# Patient Record
Sex: Male | Born: 1955 | Race: Black or African American | Hispanic: No | Marital: Married | Smoking: Former smoker
Health system: Southern US, Community
[De-identification: ages and names within clinical notes are randomized; demographics above are authoritative.]

## PROBLEM LIST (undated history)

## (undated) DIAGNOSIS — E785 Hyperlipidemia, unspecified: Secondary | ICD-10-CM

## (undated) DIAGNOSIS — I10 Essential (primary) hypertension: Secondary | ICD-10-CM

## (undated) DIAGNOSIS — J449 Chronic obstructive pulmonary disease, unspecified: Secondary | ICD-10-CM

## (undated) HISTORY — PX: TOTAL HIP ARTHROPLASTY: SHX124

## (undated) HISTORY — DX: Chronic obstructive pulmonary disease, unspecified: J44.9

## (undated) HISTORY — DX: Essential (primary) hypertension: I10

## (undated) HISTORY — DX: Hyperlipidemia, unspecified: E78.5

---

## 2000-09-24 ENCOUNTER — Encounter: Payer: Self-pay | Admitting: Podiatry

## 2000-09-24 ENCOUNTER — Ambulatory Visit (HOSPITAL_COMMUNITY): Admission: RE | Admit: 2000-09-24 | Discharge: 2000-09-24 | Payer: Self-pay | Admitting: Family Medicine

## 2000-09-24 ENCOUNTER — Encounter: Payer: Self-pay | Admitting: Family Medicine

## 2000-09-28 ENCOUNTER — Ambulatory Visit (HOSPITAL_COMMUNITY): Admission: RE | Admit: 2000-09-28 | Discharge: 2000-09-28 | Payer: Self-pay | Admitting: Podiatry

## 2001-09-02 ENCOUNTER — Encounter: Payer: Self-pay | Admitting: Family Medicine

## 2001-09-02 ENCOUNTER — Ambulatory Visit (HOSPITAL_COMMUNITY): Admission: RE | Admit: 2001-09-02 | Discharge: 2001-09-02 | Payer: Self-pay | Admitting: Family Medicine

## 2001-09-26 ENCOUNTER — Emergency Department (HOSPITAL_COMMUNITY): Admission: EM | Admit: 2001-09-26 | Discharge: 2001-09-26 | Payer: Self-pay | Admitting: *Deleted

## 2001-09-26 ENCOUNTER — Encounter: Payer: Self-pay | Admitting: *Deleted

## 2001-10-28 ENCOUNTER — Encounter: Admission: RE | Admit: 2001-10-28 | Discharge: 2001-10-28 | Payer: Self-pay | Admitting: Neurosurgery

## 2001-10-28 ENCOUNTER — Encounter: Payer: Self-pay | Admitting: Neurosurgery

## 2001-11-22 ENCOUNTER — Encounter: Payer: Self-pay | Admitting: Family Medicine

## 2001-11-22 ENCOUNTER — Ambulatory Visit (HOSPITAL_COMMUNITY): Admission: RE | Admit: 2001-11-22 | Discharge: 2001-11-22 | Payer: Self-pay | Admitting: Family Medicine

## 2001-12-28 ENCOUNTER — Encounter: Payer: Self-pay | Admitting: Orthopedic Surgery

## 2002-01-03 ENCOUNTER — Inpatient Hospital Stay (HOSPITAL_COMMUNITY): Admission: RE | Admit: 2002-01-03 | Discharge: 2002-01-06 | Payer: Self-pay | Admitting: Orthopedic Surgery

## 2002-01-03 ENCOUNTER — Encounter: Payer: Self-pay | Admitting: Orthopedic Surgery

## 2004-03-14 ENCOUNTER — Ambulatory Visit: Payer: Self-pay | Admitting: Family Medicine

## 2004-04-30 ENCOUNTER — Ambulatory Visit (HOSPITAL_COMMUNITY): Admission: RE | Admit: 2004-04-30 | Discharge: 2004-04-30 | Payer: Self-pay | Admitting: Family Medicine

## 2004-08-02 ENCOUNTER — Ambulatory Visit: Payer: Self-pay | Admitting: Family Medicine

## 2004-12-11 ENCOUNTER — Ambulatory Visit: Payer: Self-pay | Admitting: Family Medicine

## 2004-12-11 ENCOUNTER — Ambulatory Visit (HOSPITAL_COMMUNITY): Admission: RE | Admit: 2004-12-11 | Discharge: 2004-12-11 | Payer: Self-pay | Admitting: Family Medicine

## 2004-12-19 ENCOUNTER — Ambulatory Visit (HOSPITAL_COMMUNITY): Admission: RE | Admit: 2004-12-19 | Discharge: 2004-12-19 | Payer: Self-pay | Admitting: Family Medicine

## 2005-02-04 ENCOUNTER — Ambulatory Visit: Payer: Self-pay | Admitting: Family Medicine

## 2005-04-08 ENCOUNTER — Ambulatory Visit: Payer: Self-pay | Admitting: Family Medicine

## 2005-06-11 ENCOUNTER — Ambulatory Visit: Payer: Self-pay | Admitting: Family Medicine

## 2005-06-12 ENCOUNTER — Encounter (INDEPENDENT_AMBULATORY_CARE_PROVIDER_SITE_OTHER): Payer: Self-pay | Admitting: *Deleted

## 2005-06-12 LAB — CONVERTED CEMR LAB: PSA: 1.03 ng/mL

## 2005-09-01 ENCOUNTER — Ambulatory Visit: Payer: Self-pay | Admitting: Internal Medicine

## 2005-10-16 ENCOUNTER — Ambulatory Visit: Payer: Self-pay | Admitting: Family Medicine

## 2005-11-03 ENCOUNTER — Ambulatory Visit: Payer: Self-pay | Admitting: Gastroenterology

## 2005-11-03 ENCOUNTER — Encounter (INDEPENDENT_AMBULATORY_CARE_PROVIDER_SITE_OTHER): Payer: Self-pay | Admitting: *Deleted

## 2005-11-03 ENCOUNTER — Ambulatory Visit (HOSPITAL_COMMUNITY): Admission: RE | Admit: 2005-11-03 | Discharge: 2005-11-03 | Payer: Self-pay | Admitting: Gastroenterology

## 2005-11-20 ENCOUNTER — Ambulatory Visit: Payer: Self-pay | Admitting: Family Medicine

## 2005-11-21 ENCOUNTER — Ambulatory Visit (HOSPITAL_COMMUNITY): Admission: RE | Admit: 2005-11-21 | Discharge: 2005-11-21 | Payer: Self-pay | Admitting: Family Medicine

## 2005-11-21 ENCOUNTER — Encounter (INDEPENDENT_AMBULATORY_CARE_PROVIDER_SITE_OTHER): Payer: Self-pay | Admitting: *Deleted

## 2005-11-21 LAB — CONVERTED CEMR LAB: Blood Glucose, Fasting: 107 mg/dL

## 2006-02-03 ENCOUNTER — Ambulatory Visit: Payer: Self-pay | Admitting: Family Medicine

## 2006-05-07 ENCOUNTER — Ambulatory Visit: Payer: Self-pay | Admitting: Family Medicine

## 2006-05-25 ENCOUNTER — Ambulatory Visit: Payer: Self-pay | Admitting: Family Medicine

## 2006-08-18 ENCOUNTER — Ambulatory Visit: Payer: Self-pay | Admitting: Family Medicine

## 2006-09-24 ENCOUNTER — Ambulatory Visit: Payer: Self-pay | Admitting: Family Medicine

## 2006-12-25 ENCOUNTER — Ambulatory Visit: Payer: Self-pay | Admitting: Family Medicine

## 2007-03-16 ENCOUNTER — Ambulatory Visit: Payer: Self-pay | Admitting: Family Medicine

## 2007-03-31 ENCOUNTER — Encounter: Payer: Self-pay | Admitting: *Deleted

## 2007-03-31 DIAGNOSIS — M109 Gout, unspecified: Secondary | ICD-10-CM | POA: Insufficient documentation

## 2007-03-31 DIAGNOSIS — J45901 Unspecified asthma with (acute) exacerbation: Secondary | ICD-10-CM

## 2007-03-31 DIAGNOSIS — J441 Chronic obstructive pulmonary disease with (acute) exacerbation: Secondary | ICD-10-CM | POA: Insufficient documentation

## 2007-03-31 DIAGNOSIS — I10 Essential (primary) hypertension: Secondary | ICD-10-CM | POA: Insufficient documentation

## 2007-03-31 DIAGNOSIS — J439 Emphysema, unspecified: Secondary | ICD-10-CM | POA: Insufficient documentation

## 2007-04-16 ENCOUNTER — Ambulatory Visit: Payer: Self-pay | Admitting: Family Medicine

## 2007-05-20 ENCOUNTER — Ambulatory Visit: Payer: Self-pay | Admitting: Family Medicine

## 2007-09-24 ENCOUNTER — Emergency Department (HOSPITAL_COMMUNITY): Admission: EM | Admit: 2007-09-24 | Discharge: 2007-09-24 | Payer: Self-pay | Admitting: Emergency Medicine

## 2007-11-04 ENCOUNTER — Ambulatory Visit: Payer: Self-pay | Admitting: Family Medicine

## 2007-11-04 ENCOUNTER — Telehealth: Payer: Self-pay | Admitting: Family Medicine

## 2007-12-14 ENCOUNTER — Telehealth: Payer: Self-pay | Admitting: Family Medicine

## 2008-01-12 ENCOUNTER — Telehealth: Payer: Self-pay | Admitting: Family Medicine

## 2008-01-19 ENCOUNTER — Ambulatory Visit: Payer: Self-pay | Admitting: Family Medicine

## 2008-01-19 DIAGNOSIS — N63 Unspecified lump in unspecified breast: Secondary | ICD-10-CM | POA: Insufficient documentation

## 2008-01-21 ENCOUNTER — Encounter: Payer: Self-pay | Admitting: Family Medicine

## 2008-01-23 DIAGNOSIS — J209 Acute bronchitis, unspecified: Secondary | ICD-10-CM | POA: Insufficient documentation

## 2008-01-23 DIAGNOSIS — F17200 Nicotine dependence, unspecified, uncomplicated: Secondary | ICD-10-CM | POA: Insufficient documentation

## 2008-01-24 ENCOUNTER — Ambulatory Visit (HOSPITAL_COMMUNITY): Admission: RE | Admit: 2008-01-24 | Discharge: 2008-01-24 | Payer: Self-pay | Admitting: Family Medicine

## 2008-01-24 ENCOUNTER — Encounter: Payer: Self-pay | Admitting: Family Medicine

## 2008-01-31 ENCOUNTER — Telehealth: Payer: Self-pay | Admitting: Family Medicine

## 2008-08-16 ENCOUNTER — Emergency Department: Payer: Self-pay | Admitting: Internal Medicine

## 2008-08-16 ENCOUNTER — Encounter: Payer: Self-pay | Admitting: Family Medicine

## 2008-08-16 ENCOUNTER — Telehealth: Payer: Self-pay | Admitting: Family Medicine

## 2008-08-17 ENCOUNTER — Emergency Department (HOSPITAL_COMMUNITY): Admission: EM | Admit: 2008-08-17 | Discharge: 2008-08-17 | Payer: Self-pay | Admitting: Emergency Medicine

## 2008-08-17 ENCOUNTER — Emergency Department: Payer: Self-pay | Admitting: Emergency Medicine

## 2008-08-23 ENCOUNTER — Ambulatory Visit: Payer: Self-pay | Admitting: Family Medicine

## 2008-08-28 ENCOUNTER — Emergency Department (HOSPITAL_COMMUNITY): Admission: EM | Admit: 2008-08-28 | Discharge: 2008-08-28 | Payer: Self-pay | Admitting: Emergency Medicine

## 2008-09-08 ENCOUNTER — Telehealth: Payer: Self-pay | Admitting: Family Medicine

## 2008-09-10 ENCOUNTER — Emergency Department (HOSPITAL_COMMUNITY): Admission: EM | Admit: 2008-09-10 | Discharge: 2008-09-10 | Payer: Self-pay | Admitting: Emergency Medicine

## 2008-09-12 ENCOUNTER — Encounter: Payer: Self-pay | Admitting: Orthopedic Surgery

## 2008-09-19 ENCOUNTER — Ambulatory Visit: Payer: Self-pay | Admitting: Orthopedic Surgery

## 2008-09-19 DIAGNOSIS — D492 Neoplasm of unspecified behavior of bone, soft tissue, and skin: Secondary | ICD-10-CM | POA: Insufficient documentation

## 2008-09-20 ENCOUNTER — Encounter: Payer: Self-pay | Admitting: Family Medicine

## 2008-09-20 ENCOUNTER — Ambulatory Visit (HOSPITAL_COMMUNITY): Admission: RE | Admit: 2008-09-20 | Discharge: 2008-09-20 | Payer: Self-pay | Admitting: Orthopedic Surgery

## 2008-09-20 ENCOUNTER — Telehealth: Payer: Self-pay | Admitting: Orthopedic Surgery

## 2008-09-27 ENCOUNTER — Ambulatory Visit: Payer: Self-pay | Admitting: Orthopedic Surgery

## 2008-09-27 ENCOUNTER — Telehealth: Payer: Self-pay | Admitting: Family Medicine

## 2008-09-29 ENCOUNTER — Telehealth: Payer: Self-pay | Admitting: Family Medicine

## 2008-09-29 ENCOUNTER — Ambulatory Visit: Payer: Self-pay | Admitting: Family Medicine

## 2008-10-25 ENCOUNTER — Ambulatory Visit: Payer: Self-pay | Admitting: Family Medicine

## 2008-10-25 ENCOUNTER — Telehealth: Payer: Self-pay | Admitting: Family Medicine

## 2008-10-25 DIAGNOSIS — R079 Chest pain, unspecified: Secondary | ICD-10-CM | POA: Insufficient documentation

## 2008-11-14 ENCOUNTER — Telehealth: Payer: Self-pay | Admitting: Family Medicine

## 2008-12-21 ENCOUNTER — Telehealth: Payer: Self-pay | Admitting: Family Medicine

## 2008-12-28 ENCOUNTER — Telehealth: Payer: Self-pay | Admitting: Family Medicine

## 2009-02-13 ENCOUNTER — Telehealth: Payer: Self-pay | Admitting: Family Medicine

## 2009-03-13 ENCOUNTER — Telehealth: Payer: Self-pay | Admitting: Family Medicine

## 2009-04-30 ENCOUNTER — Telehealth: Payer: Self-pay | Admitting: Family Medicine

## 2009-05-14 ENCOUNTER — Telehealth: Payer: Self-pay | Admitting: Physician Assistant

## 2009-06-21 ENCOUNTER — Telehealth: Payer: Self-pay | Admitting: Family Medicine

## 2009-06-22 ENCOUNTER — Ambulatory Visit: Payer: Self-pay | Admitting: Family Medicine

## 2009-07-12 ENCOUNTER — Emergency Department (HOSPITAL_COMMUNITY): Admission: EM | Admit: 2009-07-12 | Discharge: 2009-07-13 | Payer: Self-pay | Admitting: Emergency Medicine

## 2009-11-30 ENCOUNTER — Telehealth: Payer: Self-pay | Admitting: Family Medicine

## 2010-04-16 NOTE — Progress Notes (Signed)
Summary: rx  Phone Note Call from Patient   Summary of Call: would like to get something for gout. [facility] [phone] Initial call taken by: Rudene Anda,  April 30, 2009 9:13 AM  Follow-up for Phone Call        pls refill themeds, indomethacin and prednisone he generally gets, but needs an oV in the next approx 2 months, f/u, has not been in for a hile Follow-up by: Syliva Overman MD,  April 30, 2009 9:48 AM  Additional Follow-up for Phone Call Additional follow up Details #1::        rx sent, left detailed message for patient Additional Follow-up by: Lilyan Gilford LPN,  April 30, 2009 2:17 PM    Prescriptions: INDOMETHACIN 25 MG CAPS (INDOMETHACIN) one tablet three times daily x 2 days, then one tablet twice daily x 2 days then one daily x 3 days, then stop  #13 x 0   Entered by:   Lilyan Gilford LPN   Authorized by:   Syliva Overman MD   Signed by:   Lilyan Gilford LPN on 45/40/9811   Method used:   Electronically to        [facility]  [address] (retail)       [address]       [place], [place]  [address]       Ph: [phone]       Fax: [phone]   RxID:   [id number] PREDNISONE (PAK) 5 MG TABS (PREDNISONE) Use as directed  #21 x 0   Entered by:   Lilyan Gilford LPN   Authorized by:   Syliva Overman MD   Signed by:   Lilyan Gilford LPN on 66/44/0347   Method used:   Electronically to        [facility]  [address] (retail)       [address]       [place], [place]  [address]       Ph: [phone]       Fax: [phone]   RxID:   [id number]

## 2010-04-16 NOTE — Assessment & Plan Note (Signed)
Summary: INJECTION  Nurse Visit              Comments patient complains of gout pain     Prior Medications: MAXZIDE-25 37.5-25 MG  TABS (TRIAMTERENE-HCTZ) one tab by mouth once daily Current Allergies: No known allergies     Medication Administration  Injection # 1:    Medication: Depo- Medrol 80mg     Diagnosis: GOUT (ICD-274.9)    Route: IM    Site: RUOQ gluteus    Exp Date: 9/10    Lot #: [id number]    Mfr: sicor    Patient tolerated injection without complications    Given by: Worthy Keeler LPN (November 04, 2007 2:58 PM)  Injection # 2:    Medication: Ketorolac-Toradol 15mg     Diagnosis: GOUT (ICD-274.9)    Route: IM    Site: LUOQ gluteus    Exp Date: 05/15/2009    Lot #: [id number]    Mfr: novaplus    Comments: toradol 60mg  given    Patient tolerated injection without complications    Given by: Worthy Keeler LPN (November 04, 2007 2:59 PM)  Orders Added: 1)  Depo- Medrol 80mg  [J1040] 2)  Ketorolac-Toradol 15mg  [J1885] 3)  Admin of Therapeutic Inj  intramuscular or subcutaneous Lepidus.Putnam    ]

## 2010-04-16 NOTE — Progress Notes (Signed)
Summary: samples  Phone Note Call from Patient   Summary of Call: [facility] benicar 20mg  [phone] Initial call taken by: Rudene Anda,  November 14, 2008 4:45 PM  Follow-up for Phone Call        Not on med list.  Returned call, no answer Follow-up by: Everitt Amber,  November 15, 2008 9:13 AM  Additional Follow-up for Phone Call Additional follow up Details #1::        returned call, left msg Additional Follow-up by: Everitt Amber,  November 15, 2008 1:14 PM    Additional Follow-up for Phone Call Additional follow up Details #2::    There is Benazepril 20mg  on his med list (maybe she confused it with Benicar) Called pharmacy and she said that the benazepril was ready to be picked up. Will tell Lanora Manis when she calls Follow-up by: Everitt Amber,  November 15, 2008 2:00 PM

## 2010-04-16 NOTE — Progress Notes (Signed)
Summary: referral  Phone Note Call from Patient   Summary of Call: Made referral for [facility] on 10/27/2008. He had to pay 100.00 dollars up front due to no insurance. I called pt with appt and he said he doesn't have that kind of money and for me to cancel this appt. Initial call taken by: Rudene Anda,  October 25, 2008 3:29 PM  Follow-up for Phone Call        noted, thank you Follow-up by: Syliva Overman MD,  October 25, 2008 9:34 PM     Appended Document: referral i recommend you find out if Adolph Pollack will see him and at what cost, iflower see if pt wants that apppt ps   Appended Document: referral pts wife is supposed to call back and let me know if he will go see someone at [facility].   Appended Document: referral pls [facility] [place] a note stating that he did not show after we made thearrangement , see note in your box, in a case where the pt states clearly that theyare not going, pls just notify the clinic to whom we referred also in addition to documenting pt's response and reason in thiscase it was financial.  tHIS iS A COURTESY we need to afford to the offices who we refer to so they can adjust their sched accordingly

## 2010-04-16 NOTE — Progress Notes (Signed)
Summary: MRI appointment.  Phone Note Outgoing Call   Call placed by: [name],  September 20, 2008 2:40 PM Call placed to: Specialist Action Taken: Phone Call Completed, Appt scheduled Summary of Call: I scheduled this patient an MRI at [facility] on 09-20-08 at 12:30. Patient does not have insurance. Patient has a follow up appointment back here to go over results.

## 2010-04-16 NOTE — Progress Notes (Signed)
Summary: GOUT  Phone Note Call from Patient   Summary of Call: HAS THE GOUT WANTS A SHOT  PLEASE CALL  [phone]  CAN NOT HARDLY WALK Initial call taken by: Lind Guest,  November 04, 2007 10:14 AM  Follow-up for Phone Call        Pt. can come  in for Toradol 60mg  and Depomedrol 80mg  Im today n  Follow-up by: Syliva Overman MD,  November 04, 2007 12:23 PM  Additional Follow-up for Phone Call Additional follow up Details #1::        COMING IN TODAY Additional Follow-up by: Lind Guest,  November 04, 2007 1:48 PM

## 2010-04-16 NOTE — Progress Notes (Signed)
Summary: CALL BACK  Phone Note Call from Patient   Summary of Call: WHEN U SEND IN THE MEDICINE CALL HIM [phone] Initial call taken by: Lind Guest,  March 13, 2009 11:24 AM  Follow-up for Phone Call        advised med sent in this morning Follow-up by: Worthy Keeler LPN,  March 13, 2009 2:52 PM

## 2010-04-16 NOTE — Progress Notes (Signed)
Summary: GOUT  Phone Note Call from Patient   Summary of Call: Todd Randall SAID HE HAS GOUT REAL BAD WANTS TO KNOW WILL U CALL IN SOMETHING OR A SHOT PLEASE CALL HER BACK AND LET HER KNOW AT [phone] Initial call taken by: Lind Guest,  March 13, 2009 8:21 AM  Follow-up for Phone Call        refill the indomethacin and prednisone dose pack last given pls x1  Follow-up by: Syliva Overman MD,  March 13, 2009 9:55 AM  Additional Follow-up for Phone Call Additional follow up Details #1::        Rx Randall In and patient aware Additional Follow-up by: Worthy Keeler LPN,  March 13, 2009 11:18 AM    Prescriptions: INDOMETHACIN 25 MG CAPS (INDOMETHACIN) one tablet three times daily x 2 days, then one tablet twice daily x 2 days then one daily x 3 days, then stop  #13 x 0   Entered by:   Worthy Keeler LPN   Authorized by:   Syliva Overman MD   Signed by:   Worthy Keeler LPN on 41/32/4401   Method used:   Electronically to        [facility]  [address] (retail)       [address]       [place], [place]  [address]       Ph: [phone]       Fax: [phone]   RxID:   [id number] PREDNISONE (PAK) 5 MG TABS (PREDNISONE) Use as directed  #21 x 0   Entered by:   Worthy Keeler LPN   Authorized by:   Syliva Overman MD   Signed by:   Worthy Keeler LPN on 01/60/1093   Method used:   Electronically to        [facility]  [address] (retail)       [address]       [place], [place]  [address]       Ph: [phone]       Fax: [phone]   RxID:   [id number]

## 2010-04-16 NOTE — Letter (Signed)
Summary: CT order  CT order   Imported By: Cammie Sickle 09/20/2008 09:37:59  _____________________________________________________________________  External Attachment:    Type:   Image     Comment:   External Document

## 2010-04-16 NOTE — Medication Information (Signed)
Summary: Tax adviser   Imported By: Lind Guest 01/21/2008 15:21:46  _____________________________________________________________________  External Attachment:    Type:   Image     Comment:   External Document

## 2010-04-16 NOTE — Progress Notes (Signed)
Summary: RESULTS  Phone Note Call from Patient   Summary of Call: WANTS RESULTS [phone] Initial call taken by: Rudene Anda,  January 31, 2008 2:07 PM  Follow-up for Phone Call        Phone Call Completed Follow-up by: Worthy Keeler LPN,  February 01, 2008 11:31 AM

## 2010-04-16 NOTE — Letter (Signed)
Summary: Historic Chart  Historic Chart   Imported By: Micah Flesher, LPN 82/95/6213 08:65:78  _____________________________________________________________________  External Attachment:    Type:   Image     Comment:   External Document

## 2010-04-16 NOTE — Miscellaneous (Signed)
Summary: SAMPLES  Clinical Lists Changes  Medications: Changed medication from SPIRIVA HANDIHALER 18 MCG CAPS (TIOTROPIUM BROMIDE MONOHYDRATE) two puffs qd to SPIRIVA HANDIHALER 18 MCG CAPS (TIOTROPIUM BROMIDE MONOHYDRATE) two puffs qd - Signed Rx of SPIRIVA HANDIHALER 18 MCG CAPS (TIOTROPIUM BROMIDE MONOHYDRATE) two puffs qd;  #4 x 0;  Signed;  Entered by: Worthy Keeler LPN;  Authorized by: Syliva Overman MD;  Method used: Samples Given    Prescriptions: SPIRIVA HANDIHALER 18 MCG CAPS (TIOTROPIUM BROMIDE MONOHYDRATE) two puffs qd  #4 x 0   Entered by:   Worthy Keeler LPN   Authorized by:   Syliva Overman MD   Signed by:   Worthy Keeler LPN on 04/54/0981   Method used:   Samples Given   RxID:   [id number]

## 2010-04-16 NOTE — Letter (Signed)
Summary: History form  History form   Imported By: Jacklynn Ganong 09/21/2008 16:28:39  _____________________________________________________________________  External Attachment:    Type:   Image     Comment:   External Document

## 2010-04-16 NOTE — Assessment & Plan Note (Signed)
Summary: GOUT INJ  Nurse Visit   Vital Signs:  Patient profile:   55 year old male Weight:      190.19 pounds BMI:     29.90 Pulse rate:   101 / minute BP sitting:   150 / 80  (left arm)  Vitals Entered By: Worthy Keeler LPN (September 29, 2008 10:00 AM) Comments patient in today for injections for gout pain   Allergies: No Known Drug Allergies  Medication Administration  Injection # 1:    Medication: Depo- Medrol 80mg     Diagnosis: GOUT (ICD-274.9)    Route: IM    Site: RUOQ gluteus    Exp Date: 7/12    Lot #: [id number]    Mfr: Pharmacia    Patient tolerated injection without complications    Given by: Worthy Keeler LPN (September 29, 2008 10:01 AM)  Injection # 2:    Medication: Ketorolac-Toradol 15mg     Diagnosis: GOUT (ICD-274.9)    Route: IM    Site: LUOQ gluteus    Exp Date: 04/17/2010    Lot #: [id number]    Mfr: novaplus    Comments: toradol 60mg  given    Patient tolerated injection without complications    Given by: Worthy Keeler LPN (September 29, 2008 10:02 AM)  Orders Added: 1)  Depo- Medrol 80mg  [J1040] 2)  Ketorolac-Toradol 15mg  [J1885] 3)  Admin of Therapeutic Inj  intramuscular or subcutaneous [[id number]] Prescriptions: PREDNISONE (PAK) 5 MG TABS (PREDNISONE) uad  #21 x 0   Entered by:   Worthy Keeler LPN   Authorized by:   Syliva Overman MD   Signed by:   Worthy Keeler LPN on 11/91/4782   Method used:   Electronically to        [facility]  [address] (retail)       [address]       [place], [place]  [address]       Ph: [phone]       Fax: [phone]   RxID:   [id number]    Medication Administration  Injection # 1:    Medication: Depo- Medrol 80mg     Diagnosis: GOUT (ICD-274.9)    Route: IM    Site: RUOQ gluteus    Exp Date: 7/12    Lot #: [id number]    Mfr: Pharmacia    Patient tolerated injection without complications    Given by: Worthy Keeler LPN (September 29, 2008 10:01 AM)  Injection # 2:    Medication: Ketorolac-Toradol 15mg    Diagnosis: GOUT (ICD-274.9)    Route: IM    Site: LUOQ gluteus    Exp Date: 04/17/2010    Lot #: [id number]    Mfr: novaplus    Comments: toradol 60mg  given    Patient tolerated injection without complications    Given by: Worthy Keeler LPN (September 29, 2008 10:02 AM)  Orders Added: 1)  Depo- Medrol 80mg  [J1040] 2)  Ketorolac-Toradol 15mg  [J1885] 3)  Admin of Therapeutic Inj  intramuscular or subcutaneous [[id number]]

## 2010-04-16 NOTE — Assessment & Plan Note (Signed)
Summary: office visit   Vital Signs:  Patient Profile:   55 Years Old Male Height:     67 inches (170.18 cm) Weight:      192 pounds BMI:     30.18 O2 Sat:      96 % Pulse rate:   112 / minute Resp:     16 per minute BP sitting:   150 / 100  (left arm)  Pt. in pain?   no    Location:   head    Intensity:   5    Type:       heaviness  Vitals Entered By: Everitt Amber (January 19, 2008 1:19 PM)                  Chief Complaint:  knot inn chest, cough, and numb finger.  History of Present Illness: Two week h/o painful nodule insubareolar area, no h/o breast cancer in the pt or the family.  Still smoking 1 ppd and is having increasing shortness of breath.He is debating disablity.  Numbness in right 5th finger for several months , he still has the obility to move the digit. The pt denies any recent fever or chills, he denies depression , anxiety or insomnia. H edenies any current flare up ofgout.    Current Allergies: No known allergies     Risk Factors:  Tobacco use:  current    Cigarettes:  Yes -- 1/2 pack(s) per day    Counseled to quit/cut down tobacco use:  yes Drug use:  no Alcohol use:  yes  Colonoscopy History:    Date of Last Colonoscopy:  11/03/2005   Review of Systems  ENT      Denies earache, hoarseness, nasal congestion, sinus pressure, and sore throat.  CV      Denies chest pain or discomfort, fainting, and swelling of feet.  Resp      Complains of cough, shortness of breath, sputum productive, and wheezing.  GI      Denies abdominal pain, constipation, diarrhea, and vomiting blood.  GU      Denies dysuria and urinary frequency.  Neuro      Denies headaches.  Psych      Denies anxiety and depression.   Physical Exam  General:     Well-developed,well-nourished,in no acute distress; alert,appropriate and cooperative throughout examination Head:     Normocephalic and atraumatic without obvious abnormalities. No apparent  alopecia or balding. Eyes:     vision grossly intact.   Nose:     no external deformity.   Mouth:     Oral mucosa and oropharynx without lesions or exudates.  Teeth in good repair. Neck:     No deformities, masses, or tenderness noted. Breasts:     nodule in subareolar area at 11 0clock Lungs:     decreased air entry with B wheezes and crackles Heart:     Normal rate and regular rhythm. S1 and S2 normal without gallop, murmur, click, rub or other extra sounds. Abdomen:     soft and normal bowel sounds.   Extremities:     No clubbing, cyanosis, edema, or deformity noted with normal full range of motion of all joints.   Skin:     Intact without suspicious lesions or rashes Cervical Nodes:     No lymphadenopathy noted Psych:     Cognition and judgment appear intact. Alert and cooperative with normal attention span and concentration. No apparent delusions, illusions, hallucinations  Impression & Recommendations:  Problem # 1:  BREAST MASS, RIGHT (ICD-611.72) Assessment: Comment Only  Future Orders: Radiology Referral (Radiology) ... 01/20/2008   Problem # 2:  HYPERTENSION (ICD-401.9) Assessment: Deteriorated  His updated medication list for this problem includes:    Maxzide-25 37.5-25 Mg Tabs (Triamterene-hctz) ..... One tab by mouth once daily    Lotensin 20 Mg Tabs (Benazepril hcl) .Marland Kitchen..Marland Kitchen Two tabs by mouth qd    Norvasc 5 Mg Tabs (Amlodipine besylate) ..... One tab by mouth qhs  Orders: T-Basic Metabolic Panel [id number])  BP today: 150/100 Prior BP: 168/106 (03/16/2007)   Problem # 3:  ASTHMA (ICD-493.90) Assessment: Unchanged  His updated medication list for this problem includes:    Advair Diskus 250-50 Mcg/dose Misc (Fluticasone-salmeterol) .Marland Kitchen..Marland Kitchen Two puffs bid    Spiriva Handihaler 18 Mcg Caps (Tiotropium bromide monohydrate) .Marland Kitchen..Marland Kitchen Two puffs qd   Problem # 4:  NICOTINE ADDICTION (ICD-305.1) Assessment: Unchanged Encouraged smoking cessation and  discussed different methods for smoking cessation.   Problem # 5:  ACUTE BRONCHITIS (ICD-466.0) Assessment: Deteriorated  His updated medication list for this problem includes:    Tessalon Perles 100 Mg Caps (Benzonatate) .Marland Kitchen... Take 1 capsule by mouth three times a day    Septra Ds 800-160 Mg Tabs (Sulfamethoxazole-trimethoprim) .Marland Kitchen... Take 1 capsule by mouth two times a day    Advair Diskus 250-50 Mcg/dose Misc (Fluticasone-salmeterol) .Marland Kitchen..Marland Kitchen Two puffs bid    Spiriva Handihaler 18 Mcg Caps (Tiotropium bromide monohydrate) .Marland Kitchen..Marland Kitchen Two puffs qd   Complete Medication List: 1)  Maxzide-25 37.5-25 Mg Tabs (Triamterene-hctz) .... One tab by mouth once daily 2)  Lotensin 20 Mg Tabs (Benazepril hcl) .... Two tabs by mouth qd 3)  Tessalon Perles 100 Mg Caps (Benzonatate) .... Take 1 capsule by mouth three times a day 4)  Septra Ds 800-160 Mg Tabs (Sulfamethoxazole-trimethoprim) .... Take 1 capsule by mouth two times a day 5)  Advair Diskus 250-50 Mcg/dose Misc (Fluticasone-salmeterol) .... Two puffs bid 6)  Norvasc 5 Mg Tabs (Amlodipine besylate) .... One tab by mouth qhs 7)  Spiriva Handihaler 18 Mcg Caps (Tiotropium bromide monohydrate) .... Two puffs qd  Other Orders: CXR- 2view (CXR) T-CBC w/Diff ([id number]) T-PSA  ([id number]) T-Lipid Profile ([id number])   Patient Instructions: 1)  Please schedule a follow-up appointment in 4 months. 2)  You are being treated for  bronchitis.You need a CXR and you need to stop smoking. 3)  you are being referred for radiologic test on the nodule of your R breast.  4)  Tobacco is very bad for your health and your loved ones! You Should stop smoking!. 5)  Stop Smoking Tips: Choose a Quit date. Cut down before the Quit date. decide what you will do as a substitute when you feel the urge to smoke(gum,toothpick,exercise).   Prescriptions: SPIRIVA HANDIHALER 18 MCG CAPS (TIOTROPIUM BROMIDE MONOHYDRATE) two puffs qd  #3 mnth x 3   Entered by:   Worthy Keeler LPN   Authorized by:   Syliva Overman MD   Signed by:   Worthy Keeler LPN on 72/53/6644   Method used:   Handwritten   RxID:   [id number] NORVASC 5 MG TABS (AMLODIPINE BESYLATE) one tab by mouth qhs  #90 x 3   Entered by:   Worthy Keeler LPN   Authorized by:   Syliva Overman MD   Signed by:   Worthy Keeler LPN on 33/29/5188   Method used:   Handwritten   RxID:   [id number] ADVAIR DISKUS 250-50 MCG/DOSE MISC (  FLUTICASONE-SALMETEROL) two puffs bid  #56mnths x 3   Entered by:   Worthy Keeler LPN   Authorized by:   Syliva Overman MD   Signed by:   Worthy Keeler LPN on 40/34/7425   Method used:   Handwritten   RxID:   [id number] MAXZIDE-25 37.5-25 MG  TABS (TRIAMTERENE-HCTZ) one tab by mouth once daily  #30 x 5   Entered by:   Worthy Keeler LPN   Authorized by:   Syliva Overman MD   Signed by:   Worthy Keeler LPN on 84/16/6063   Method used:   Electronically to        [facility]  [address] (retail)       [address]       [place], [place]  [address]       Ph: [phone]       Fax: [phone]   RxID:   [id number] SEPTRA DS 800-160 MG TABS (SULFAMETHOXAZOLE-TRIMETHOPRIM) Take 1 capsule by mouth two times a day  #20 x 0   Entered and Authorized by:   Syliva Overman MD   Signed by:   Syliva Overman MD on 01/19/2008   Method used:   Electronically to        [facility]  [address] [address] (retail)       [address]       [place], [place]  [address]       Ph: [phone]       Fax: [phone]   RxID:   [id number] TESSALON PERLES 100 MG CAPS (BENZONATATE) Take 1 capsule by mouth three times a day  #30 x 0   Entered and Authorized by:   Syliva Overman MD   Signed by:   Syliva Overman MD on 01/19/2008   Method used:   Electronically to        [facility]  [address] [address] (retail)       [address]       [place], [place]  [address]       Ph: [phone]       Fax: [phone]   RxID:    [id number]  ]

## 2010-04-16 NOTE — Progress Notes (Signed)
  Phone Note Call from Patient   Summary of Call: [phone] Patient called and wants his Cardura refilled. Its not on his medlist. Looks like it was removed on 07/15/2008. He states he still takes it. Initial call taken by: Everitt Amber,  September 08, 2008 10:40 AM  Follow-up for Phone Call        pls have the pt let you know where he last filled his cardura so you can verify and refill, I DO believe that he is taking it, and he needs to bring in all his bottles early next week so his med list can be updated, or if he is getting them all at the same place then you can take it off the computer. Follow-up by: Syliva Overman MD,  September 08, 2008 12:13 PM  Additional Follow-up for Phone Call Additional follow up Details #1::        patient had stopped taking med but started back on it recently. advised to bring all meds so we can revise med list and he agrees Additional Follow-up by: Worthy Keeler LPN,  September 08, 2008 2:50 PM    New/Updated Medications: CARDURA 4 MG TABS (DOXAZOSIN MESYLATE) two tabs by mouth at bedtime   Prescriptions: CARDURA 4 MG TABS (DOXAZOSIN MESYLATE) two tabs by mouth at bedtime  #60 x 0   Entered by:   Worthy Keeler LPN   Authorized by:   Syliva Overman MD   Signed by:   Worthy Keeler LPN on 42/59/5638   Method used:   Electronically to        [facility]  [address] [address] (retail)       [address]       [place], [place]  [address]       Ph: [phone]       Fax: [phone]   RxID:   [id number]

## 2010-04-16 NOTE — Assessment & Plan Note (Signed)
Summary: ap er 6/27 for left knee pain/xr there/aware to pay $100/bsf   Visit Type:  Initial Consult  CC:  left knee pain.  History of Present Illness: I saw Todd Randall in the office today for an initial visit.  He is a 55 years old man with the complaint of: LESION LEFT LEG  tHIS IS A RATHER HEALTHY MAN WHO HAD WHAT SOUNDS LIKE A GOUT ATTACK IN THE LEFT KNEE. hE HAD 3 DAYS OF PROGRESSIVE PAIN IN THE LEFT KNEE WITH SWELLING. HE HAS A H/O GOUT IN HIS FEET. HIS PAIN WAS RELIEVED WITH PREDNISONE.HE IS NOW W/O SYMPTOMS.  DURING THE EVAL A LARGE ENCHONDROMA OR NOF WAS FOUND.  HE HAS NO PAIN IN THE LEFT TIBIA WHERE THE LESION WAS FOUND    Current Medications (verified): 1)  Maxzide-25 37.5-25 Mg  Tabs (Triamterene-Hctz) .... One Tab By Mouth Once Daily 2)  Lotensin 20 Mg Tabs (Benazepril Hcl) .... Two Tabs By Mouth Qd 3)  Advair Diskus 250-50 Mcg/dose Misc (Fluticasone-Salmeterol) .... Two Puffs Bid 4)  Norvasc 5 Mg Tabs (Amlodipine Besylate) .... One Tab By Mouth Qhs 5)  Spiriva Handihaler 18 Mcg Caps (Tiotropium Bromide Monohydrate) .... Two Puffs Qd 6)  Penicillin V Potassium 500 Mg Tabs (Penicillin V Potassium) .... Take 1 Tablet By Mouth Three Times A Day 7)  Tessalon Perles 100 Mg Caps (Benzonatate) .... Take 1 Capsule By Mouth Three Times A Day 8)  Cardura 4 Mg Tabs (Doxazosin Mesylate) .... Two Tabs By Mouth At Bedtime  Allergies (verified): No Known Drug Allergies  Past History:  Past Medical History: Last updated: 03/31/2007 Asthma COPD Gout Hypertension Stage 2 Hypertension, uncontrolled Tobacco Abuse Alcohol Dependence  Past Surgical History: Last updated: 03/31/2007 Total hip replacement Right Tonsillectomy  Family History: Last updated: 03/31/2007 Family History of Colon CA 1st degree relative <60- Mother deceased age 21 Family History of Prostate CA 1st degree relative <50- Father deceased age 70 3 siblings all presumed healthy  Social History: Last  updated: 03/31/2007 Occupation: Sales executive Married Current Smoker Alcohol use-yes Drug use-no No Children  Risk Factors: Smoking Status: current (03/31/2007) Packs/Day: 1/2 (01/19/2008)  Review of Systems      See HPI   Knee Exam  General:    Well-developed, well-nourished, normal body habitus; no deformities, normal grooming.  Gait:    Normal heel-toe gait pattern bilaterally.    Skin:    Intact, no scars, lesions, rashes, cafe au lait spots, or bruising.    Inspection:     No deformity, ecchymosis or swelling.   Palpation:    Non-tender to palpation over medial joint line, lateral joint line, parapatellar, condylar, patellar tendon, or Pes bursa.   Vascular:    There was no swelling or varicose veins. The pulses and temperature are normal. There was no edema or tenderness.  Sensory:    Gross coordination and sensation were normal.    Motor:    Motor strength 5/5 bilaterally for quadriceps, hamstrings, ankle dorsiflexion, and ankle plantar flexion.    Reflexes:    Normal and symmetric patellar and Achilles reflexes bilaterally.    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 The report and the films have been reviewed. see hpi  CT r/o malignanat bone tumor   Orders: New Patient Level III (01027)  Patient Instructions: 1)   CT left tibia - Bone lesion

## 2010-04-16 NOTE — Progress Notes (Signed)
Summary: MEDICINE  Phone Note Call from Patient   Summary of Call: WANTS TO KNOW HAS HIS MEDICINE BEEN AUTHORIZED YET CALL AND LET HIM KNOW AT [phone] Initial call taken by: Lind Guest,  January 12, 2008 1:22 PM  Follow-up for Phone Call        returned call, left message Follow-up by: Worthy Keeler LPN,  January 12, 2008 2:10 PM  Additional Follow-up for Phone Call Additional follow up Details #1::        Rx Called In, LEFT MESSAGE Additional Follow-up by: Worthy Keeler LPN,  January 13, 2008 8:52 AM

## 2010-04-16 NOTE — Progress Notes (Signed)
Summary: WANTS A SHOT  Phone Note Call from Patient   Summary of Call: WANTS TO KNOW IF HE CAN COME IN GET A SHOT FOR HIS GOUT CALL BACK AT [phone] Initial call taken by: Lind Guest,  December 14, 2007 11:06 AM  Follow-up for Phone Call        Offer  toradol 60mg  and Depomedrol 80mg  only if no depomedrol in 12 weeks on 09/30 or 12/16/07, whatever is best as nurse availability is concerned Follow-up by: Syliva Overman MD,  December 14, 2007 10:49 PM  Additional Follow-up for Phone Call Additional follow up Details #1::        PATIENT CAN ONLY HAVE TORADOL, ADVISED WIFE TO HAVE PATIENT CALL OFFICE Additional Follow-up by: Worthy Keeler LPN,  December 15, 2007 10:09 AM    Additional Follow-up for Phone Call Additional follow up Details #2::    WIFE STATED PATIENT WORKING BUT WILL HAVE HIM CALL OFFICE IF HE STILL WANTS TO COME IN FOR A SHOT Follow-up by: Worthy Keeler LPN,  December 16, 2007 10:38 AM

## 2010-04-16 NOTE — Progress Notes (Signed)
Summary: GOUT INJECTIONS  Phone Note Call from Patient   Summary of Call: WANTS TO KNOW CAN HE COME IN AND GET SHOTS FOR THE GOUT THIS MORNING CALL BACK AT [phone] Initial call taken by: Lind Guest,  September 29, 2008 8:07 AM  Follow-up for Phone Call        nurse visit ok at the one remaining open slot, pls give him that time, thanks Follow-up by: Syliva Overman MD,  September 29, 2008 8:13 AM  Additional Follow-up for Phone Call Additional follow up Details #1::        nurse visit only, pls document which joint9s0 hurt and for how long, and administer Toradol 60mg  im and depomedrol 80mg  iM , pls ensure not allergic to either , and also erx prednisone 5mg  dose pack use as directed #21 only to his pharmacy and advise him to fill   Additional Follow-up by: Syliva Overman MD,  September 29, 2008 8:15 AM    Additional Follow-up for Phone Call Additional follow up Details #2::    APPT COMPLETED Follow-up by: Lind Guest,  September 29, 2008 8:31 AM

## 2010-04-16 NOTE — Miscellaneous (Signed)
Summary: meds  Clinical Lists Changes  Medications: Removed medication of NORVASC 5 MG TABS (AMLODIPINE BESYLATE) one tab by mouth qhs Removed medication of PENICILLIN V POTASSIUM 500 MG TABS (PENICILLIN V POTASSIUM) Take 1 tablet by mouth three times a day Removed medication of TESSALON PERLES 100 MG CAPS (BENZONATATE) Take 1 capsule by mouth three times a day  Appended Document: meds

## 2010-04-16 NOTE — Assessment & Plan Note (Signed)
Summary: office visit   Vital Signs:  Patient profile:   55 year old male Height:      67 inches Weight:      187.19 pounds BMI:     29.42 O2 Sat:      94 % Pulse rate:   101 / minute Pulse rhythm:   regular Resp:     16 per minute BP sitting:   114 / 86  (left arm) Cuff size:   regular  Vitals Entered By: Everitt Amber (August 23, 2008 1:38 PM) CC: was discharged from the detox because of dangerously high blood pressure   CC:  was discharged from the detox because of dangerously high blood pressure.  History of Present Illness: Pt recently commited  himself for rehab aand was d/c when his BP rose the facility in [place] was not equipped for this. He was also told he ha d pneumonia and did not take the penicillin, he needs to do this. He denies any fevr , chills orproductive, i an however fearful that he will be denied access to inpt rehab if the facility is aware of the radiologic abnormality if he does not at least start treament, he understands, agrees to start meds and to commit himself on 08/28/2008.   Allergies: No Known Drug Allergies  Review of Systems      See HPI General:  Complains of fatigue and sleep disorder. ENT:  Denies hoarseness, nasal congestion, and sinus pressure. CV:  Denies chest pain or discomfort and palpitations. Resp:  See HPI. GI:  Denies abdominal pain, constipation, diarrhea, nausea, and vomiting. GU:  Denies urinary frequency and urinary hesitancy. MS:  Denies joint pain and stiffness. Psych:  See HPI; Complains of anxiety, depression, easily tearful, and irritability; denies suicidal thoughts/plans, thoughts of violence, and unusual visions or sounds.  Physical Exam  General:  alert, well-hydrated, and overweight-appearing.  HEENT: No facial asymmetry,  EOMI, No sinus tenderness, TM's Clear, oropharynx  pink and moist.   Chest: Clear to auscultation bilaterally.  CVS: S1, S2, No murmurs, No S3.   Abd: Soft, Nontender.  MS: Adequate ROM  spine, hips, shoulders and knees.  Ext: No edema.   CNS: CN 2-12 intact, power tone and sensation normal throughout.   Skin: Intact, no visible lesions or rashes.  Psych: Good eye contact, normal affect.  Memory intact,depressed appearing and tearfukl   Impression & Recommendations:  Problem # 1:  ACUTE BRONCHITIS (ICD-466.0) Assessment Comment Only  The following medications were removed from the medication list:    Tessalon Perles 100 Mg Caps (Benzonatate) .Marland Kitchen... Take 1 capsule by mouth three times a day    Septra Ds 800-160 Mg Tabs (Sulfamethoxazole-trimethoprim) .Marland Kitchen... Take 1 capsule by mouth two times a day His updated medication list for this problem includes:    Advair Diskus 250-50 Mcg/dose Misc (Fluticasone-salmeterol) .Marland Kitchen..Marland Kitchen Two puffs bid    Spiriva Handihaler 18 Mcg Caps (Tiotropium bromide monohydrate) .Marland Kitchen..Marland Kitchen Two puffs qd    Penicillin V Potassium 500 Mg Tabs (Penicillin v potassium) .Marland Kitchen... Take 1 tablet by mouth three times a day    Tessalon Perles 100 Mg Caps (Benzonatate) .Marland Kitchen... Take 1 capsule by mouth three times a day  Problem # 2:  NICOTINE ADDICTION (ICD-305.1) Assessment: Unchanged  Encouraged smoking cessation and discussed different methods for smoking cessation.   Problem # 3:  HYPERTENSION (ICD-401.9) Assessment: Improved  His updated medication list for this problem includes:    Maxzide-25 37.5-25 Mg Tabs (Triamterene-hctz) ..... One tab  by mouth once daily    Lotensin 20 Mg Tabs (Benazepril hcl) .Marland Kitchen..Marland Kitchen Two tabs by mouth qd    Norvasc 5 Mg Tabs (Amlodipine besylate) ..... One tab by mouth qhs  BP today: 114/86 Prior BP: 150/100 (01/19/2008)  Problem # 4:  ASTHMA (ICD-493.90) Assessment: Improved  His updated medication list for this problem includes:    Advair Diskus 250-50 Mcg/dose Misc (Fluticasone-salmeterol) .Marland Kitchen..Marland Kitchen Two puffs bid    Spiriva Handihaler 18 Mcg Caps (Tiotropium bromide monohydrate) .Marland Kitchen..Marland Kitchen Two puffs qd  Complete Medication List: 1)   Maxzide-25 37.5-25 Mg Tabs (Triamterene-hctz) .... One tab by mouth once daily 2)  Lotensin 20 Mg Tabs (Benazepril hcl) .... Two tabs by mouth qd 3)  Advair Diskus 250-50 Mcg/dose Misc (Fluticasone-salmeterol) .... Two puffs bid 4)  Norvasc 5 Mg Tabs (Amlodipine besylate) .... One tab by mouth qhs 5)  Spiriva Handihaler 18 Mcg Caps (Tiotropium bromide monohydrate) .... Two puffs qd 6)  Penicillin V Potassium 500 Mg Tabs (Penicillin v potassium) .... Take 1 tablet by mouth three times a day 7)  Tessalon Perles 100 Mg Caps (Benzonatate) .... Take 1 capsule by mouth three times a day  Patient Instructions: 1)  Please schedule a follow-up appointment in 2 months. 2)  Pls go to the Ed for self admission to inpt detox for alcohol dependece as discussed. 3)  You need to take the med for lung infection. 4)  continue your current BP meds. Prescriptions: TESSALON PERLES 100 MG CAPS (BENZONATATE) Take 1 capsule by mouth three times a day  #21 x 0   Entered and Authorized by:   Syliva Overman MD   Signed by:   Syliva Overman MD on 08/23/2008   Method used:   Electronically to        [facility]  [address] [facility] (retail)       [address]       [place], [place]  [address]       Ph: [phone]       Fax: [phone]   RxID:   [id number] PENICILLIN V POTASSIUM 500 MG TABS (PENICILLIN V POTASSIUM) Take 1 tablet by mouth three times a day  #21 x 0   Entered and Authorized by:   Syliva Overman MD   Signed by:   Syliva Overman MD on 08/23/2008   Method used:   Electronically to        [facility]  [address] [facility] (retail)       [address]       [place], [place]  [address]       Ph: [phone]       Fax: [phone]   RxID:   [id number]

## 2010-04-16 NOTE — Progress Notes (Signed)
Summary: rx  Phone Note Call from Patient   Summary of Call: left message needs medicine for gout it is in his wrist call back at [phone] Initial call taken by: Lind Guest,  June 21, 2009 7:47 AM  Follow-up for Phone Call        appt tomorrow with dawn  Follow-up by: Adella Hare LPN,  June 21, 2009 10:07 AM

## 2010-04-16 NOTE — Progress Notes (Signed)
Summary: shot  Phone Note Call from Patient   Summary of Call: he wants to know if he can get shot for gout. [phone]  Initial call taken by: Rudene Anda,  February 13, 2009 2:10 PM  Follow-up for Phone Call        Has gout in his left foot and wants to come in for a pain injection. Call back today Follow-up by: Everitt Amber,  February 13, 2009 2:23 PM  Additional Follow-up for Phone Call Additional follow up Details #1::        Refilled his gout meds per dr. Lodema Hong. Called and patient aware Additional Follow-up by: Everitt Amber,  February 13, 2009 4:38 PM    Prescriptions: PREDNISONE (PAK) 5 MG TABS (PREDNISONE) Use as directed  #21 x 0   Entered by:   Everitt Amber   Authorized by:   Syliva Overman MD   Signed by:   Everitt Amber on 02/13/2009   Method used:   Electronically to        [facility]  [address] (retail)       [address]       [place], [place]  [address]       Ph: [phone]       Fax: [phone]   RxID:   [id number] INDOMETHACIN 25 MG CAPS (INDOMETHACIN) one tablet three times daily x 2 days, then one tablet twice daily x 2 days then one daily x 3 days, then stop  #13 x 0   Entered by:   Everitt Amber   Authorized by:   Syliva Overman MD   Signed by:   Everitt Amber on 02/13/2009   Method used:   Electronically to        [facility]  [address] [place] [address] (retail)       [address]       [place], [place]  [address]       Ph: [phone]       Fax: [phone]   RxID:   [id number]

## 2010-04-16 NOTE — Assessment & Plan Note (Signed)
Summary: 1 WK REVIEW CT RESULTS/LT KNEE/[facility] DISCOUNT/CAF   Visit Type:  image followup  CC:  LEFT tibia lesion.  History of Present Illness: I saw Todd Randall in the office today for a followup visit.  He is a 55 years old man with the complaint [id number] CT results of the left tibia taken [facility] 09/20/08.    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  Alternatively, this could represent a benign fibrous lesion.     HISTORY I saw Todd Randall in the office today for an initial visit.  He is a 55 years old man with the complaint of: LESION LEFT LEG  tHIS IS A RATHER HEALTHY MAN WHO HAD WHAT SOUNDS LIKE A GOUT ATTACK IN THE LEFT KNEE. hE HAD 3 DAYS OF PROGRESSIVE PAIN IN THE LEFT KNEE WITH SWELLING. HE HAS A H/O GOUT IN HIS FEET. HIS PAIN WAS RELIEVED WITH PREDNISONE.HE IS NOW W/O SYMPTOMS.  DURING THE EVAL A LARGE ENCHONDROMA OR NOF WAS FOUND.  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 The report and the films have been reviewed. CT scan reviewed with reportbenign lesion LEFT tibia  Orders: Est. Patient Level II (98119)  Patient Instructions: 1)  Please schedule a follow-up appointment in 6 months.x-ray followup LEFT tibia

## 2010-04-16 NOTE — Progress Notes (Signed)
Summary: refill  Phone Note Call from Patient   Summary of Call: needs autherzation on blood pressure pills. it has been over a week and still hasn't got them. pt took last one yesterday morning. [phone] Initial call taken by: Rudene Anda,  November 30, 2009 9:11 AM  Follow-up for Phone Call        advised patient will fill for one month only, needs appt patient states no insurance and i advised to call for [place] discount and he states he has and hasnt heard anything back, advised patient to follow up with hospital Follow-up by: Adella Hare LPN,  November 30, 2009 10:39 AM    Prescriptions: LOTENSIN 20 MG TABS (BENAZEPRIL HCL) TWO TABS by mouth QD  #60 x 0   Entered by:   Adella Hare LPN   Authorized by:   Syliva Overman MD   Signed by:   Adella Hare LPN on 66/44/0347   Method used:   Electronically to        [facility]  [address] [address] (retail)       [address]       [place], [place]  [address]       Ph: [phone]       Fax: [phone]   RxID:   [id number]

## 2010-04-16 NOTE — Progress Notes (Signed)
Summary: rx  Phone Note Call from Patient   Summary of Call: needs to call in bp pills )benazepril 20mg . [facility] [phone] Initial call taken by: Rudene Anda,  December 21, 2008 3:23 PM  Follow-up for Phone Call        patient aware Follow-up by: Everitt Amber,  December 21, 2008 3:46 PM    Prescriptions: LOTENSIN 20 MG TABS (BENAZEPRIL HCL) TWO TABS by mouth QD  #60 Each x 3   Entered by:   Everitt Amber   Authorized by:   Syliva Overman MD   Signed by:   Everitt Amber on 12/21/2008   Method used:   Electronically to        [facility]  [address] [place] [address] (retail)       [address]       [place], [place]  [address]       Ph: [phone]       Fax: [phone]   RxID:   [id number]

## 2010-04-16 NOTE — Assessment & Plan Note (Signed)
Summary: GOUT- room 3   Vital Signs:  Patient profile:   55 year old male Height:      67 inches Weight:      216.25 pounds BMI:     33.99 O2 Sat:      96 % on Room air Pulse rate:   103 / minute Resp:     16 per minute BP sitting:   160 / 90  (left arm)  Vitals Entered By: Adella Hare LPN (June 22, 1608 8:19 AM)  Serial Vital Signs/Assessments:  Time      Position  BP       Pulse  Resp  Temp     By                     170/106                        Esperanza Sheets PA  CC: gout Is Patient Diabetic? No Pain Assessment Patient in pain? yes     Location: right wrist and hand Intensity: 8 Type: aching Onset of pain  Constant   CC:  gout.  History of Present Illness: Pt is here today with c/o gout flare up in his Rt wrist. Awoke with pain, & swelling 2 days ago. Hx of gout, but this is the first time in his wrist.  Usually has it in his feet, sometimes knee. No trauma.  Hx of htn. Not taking medications as prescribed. He is only taking his Benazapril.  He states he is taking this daily & has 1 refill.  But pt last received rx for this Aug 2010, so must not be truly taking it every day.  He is not taking his Cardura or Maxide at all.No headache, chest pain or palpitations.   Current Medications (verified): 1)  Lotensin 20 Mg Tabs (Benazepril Hcl) .... Two Tabs By Mouth Qd 2)  Advair Diskus 250-50 Mcg/dose Misc (Fluticasone-Salmeterol) .... Two Puffs Bid 3)  Spiriva Handihaler 18 Mcg Caps (Tiotropium Bromide Monohydrate) .... Two Puffs Qd 4)  Cardura 4 Mg Tabs (Doxazosin Mesylate) .... Two Tabs By Mouth At Bedtime 5)  Symbicort 160-4.5 Mcg/act Aero (Budesonide-Formoterol Fumarate) .... Two Puffs By Mouth Bid 6)  Ventolin Hfa 108 (90 Base) Mcg/act Aers (Albuterol Sulfate) .... Two Puffs Every 4-6 Hours Prn 7)  Mirtazapine 30 Mg Tabs (Mirtazapine) .Marland Kitchen.. 1 Tab At Bedtime 8)  Lamotrigine 100 Mg Tabs (Lamotrigine) .... Take 1/2 Tab  Every Morning 9)  Maxzide 75-50 Mg Tabs  (Triamterene-Hctz) .... Take 1 Tablet By Mouth Once A Day 10)  Prednisone (Pak) 5 Mg Tabs (Prednisone) .... Use As Directed 11)  Indomethacin 25 Mg Caps (Indomethacin) .... One Tablet Three Times Daily X 2 Days, Then One Tablet Twice Daily X 2 Days Then One Daily X 3 Days, Then Stop  Allergies (verified): No Known Drug Allergies  Past History:  Past medical, surgical, family and social histories (including risk factors) reviewed for relevance to current acute and chronic problems.  Past Medical History: Reviewed history from 03/31/2007 and no changes required. Asthma COPD Gout Hypertension Stage 2 Hypertension, uncontrolled Tobacco Abuse Alcohol Dependence  Past Surgical History: Reviewed history from 03/31/2007 and no changes required. Total hip replacement Right Tonsillectomy  Family History: Reviewed history from 03/31/2007 and no changes required. Family History of Colon CA 1st degree relative <60- Mother deceased age 66 Family History of Prostate CA 1st degree relative <50- Father deceased age 53  3 siblings all presumed healthy  Social History: Reviewed history from 03/31/2007 and no changes required. Occupation: Sales executive Married Current Smoker Alcohol use-yes Drug use-no No Children  Review of Systems General:  Denies chills and fever. CV:  Denies difficulty breathing at night and palpitations. Resp:  Denies shortness of breath. MS:  Complains of joint pain, joint redness, and joint swelling. Neuro:  Denies numbness and tingling.  Physical Exam  General:  Well-developed,well-nourished,in no acute distress; alert,appropriate and cooperative throughout examination Head:  Normocephalic and atraumatic without obvious abnormalities. No apparent alopecia or balding. Ears:  External ear exam shows no significant lesions or deformities.  Otoscopic examination reveals clear canals, tympanic membranes are intact bilaterally without bulging, retraction,  inflammation or discharge. Hearing is grossly normal bilaterally. Nose:  External nasal examination shows no deformity or inflammation. Nasal mucosa are pink and moist without lesions or exudates. Mouth:  Oral mucosa and oropharynx without lesions or exudates.   Neck:  No deformities, masses, or tenderness noted. Lungs:  Normal respiratory effort, chest expands symmetrically. Lungs are clear to auscultation, no crackles or wheezes. Heart:  Normal rate and regular rhythm. S1 and S2 normal without gallop, murmur, click, rub or other extra sounds. Msk:  Rt wrist decreased ROM, joint tenderness, joint swelling, joint warmth, and redness over joint.   Pulses:  R radial normal.   Neurologic:  alert & oriented X3 and sensation intact to light touch.   Skin:  Intact without suspicious lesions or rashes Psych:  Cognition and judgment appear intact. Alert and cooperative with normal attention span and concentration. No apparent delusions, illusions, hallucinations   Impression & Recommendations:  Problem # 1:  GOUT (ICD-274.9) Assessment Deteriorated  Gout information, including low purine diet information given to pt. Consider preventive prescription, possibly allopurinol.  His updated medication list for this problem includes:    Indomethacin 25 Mg Caps (Indomethacin) ..... One tablet three times daily x 2 days, then one tablet twice daily x 2 days then one daily x 3 days, then stop  Orders: T-Uric Acid (Blood) ([address]) T-CBC No Diff ([address]-91478)  Problem # 2:  HYPERTENSION (ICD-401.9) Assessment: Deteriorated  Medication noncompliance. Advised pt to take Benazepril & Cardura every day.  Will hold Maxzide at this time due to gout flare ups recently.  His updated medication list for this problem includes:    Lotensin 20 Mg Tabs (Benazepril hcl) .Marland Kitchen..Marland Kitchen Two tabs by mouth qd    Cardura 4 Mg Tabs (Doxazosin mesylate) .Marland Kitchen..Marland Kitchen Two tabs by mouth at bedtime    Maxzide 75-50 Mg Tabs  (Triamterene-hctz) .Marland Kitchen... Take 1 tablet by mouth once a day  BP today: 160/90 Prior BP: 140/100 (10/25/2008)  Orders: T-Basic Metabolic Panel [phone])  Complete Medication List: 1)  Lotensin 20 Mg Tabs (Benazepril hcl) .... Two tabs by mouth qd 2)  Advair Diskus 250-50 Mcg/dose Misc (Fluticasone-salmeterol) .... Two puffs bid 3)  Spiriva Handihaler 18 Mcg Caps (Tiotropium bromide monohydrate) .... Two puffs qd 4)  Cardura 4 Mg Tabs (Doxazosin mesylate) .... Two tabs by mouth at bedtime 5)  Symbicort 160-4.5 Mcg/act Aero (Budesonide-formoterol fumarate) .... Two puffs by mouth bid 6)  Ventolin Hfa 108 (90 Base) Mcg/act Aers (Albuterol sulfate) .... Two puffs every 4-6 hours prn 7)  Mirtazapine 30 Mg Tabs (Mirtazapine) .Marland Kitchen.. 1 tab at bedtime 8)  Lamotrigine 100 Mg Tabs (Lamotrigine) .... Take 1/2 tab  every morning 9)  Maxzide 75-50 Mg Tabs (Triamterene-hctz) .... Take 1 tablet by mouth once a day 10)  Prednisone (pak) 5 Mg Tabs (Prednisone) .... Use as directed 11)  Indomethacin 25 Mg Caps (Indomethacin) .... One tablet three times daily x 2 days, then one tablet twice daily x 2 days then one daily x 3 days, then stop  Patient Instructions: 1)  Please schedule a follow-up appointment in 1 month. 2)  Tobacco is very bad for your health and your loved ones! You Should stop smoking!. 3)  Stop Smoking Tips: Choose a Quit date. Cut down before the Quit date. decide what you will do as a substitute when you feel the urge to smoke(gum,toothpick,exercise). 4)  It is important that you exercise regularly at least 20 minutes 5 times a week. If you develop chest pain, have severe difficulty breathing, or feel very tired , stop exercising immediately and seek medical attention. 5)  You need to lose weight. Consider a lower calorie diet and regular exercise.  6)  You have received a shot of Depo Medrol today to help with gout inflammation, and Toradol for pain. 7)  I have refilled indomethasine also  for your gout. 8)  Please have labs drawn today. 9)  Please take your blood pressure medications as prescribed.  Prescriptions: INDOMETHACIN 25 MG CAPS (INDOMETHACIN) one tablet three times daily x 2 days, then one tablet twice daily x 2 days then one daily x 3 days, then stop  #13 x 0   Entered and Authorized by:   Esperanza Sheets PA   Signed by:   Esperanza Sheets PA on 06/22/2009   Method used:   Electronically to        [facility]  [address] (retail)       [address]       [place], [place]  [address]       Ph: [phone]       Fax: [phone]   RxID:   [id number]   Appended Document: GOUT- room 3   Medication Administration  Injection # 1:    Medication: Depo- Medrol 80mg     Diagnosis: GOUT (ICD-274.9)    Route: IM    Site: RUOQ gluteus    Exp Date: 11/11    Lot #: [id number]    Mfr: Pharmacia    Patient tolerated injection without complications    Given by: Adella Hare LPN (June 23, 4399 9:14 AM)  Injection # 2:    Medication: Ketorolac-Toradol 15mg     Diagnosis: GOUT (ICD-274.9)    Route: IM    Site: LUOQ gluteus    Exp Date: 10/16/2010    Lot #: [id number]    Mfr: novaplus    Comments: toradol 60mg  given    Patient tolerated injection without complications    Given by: Adella Hare LPN (June 23, 6438 9:15 AM)  Orders Added: 1)  Depo- Medrol 80mg  [J1040] 2)  Ketorolac-Toradol 15mg  [J1885] 3)  Admin of Therapeutic Inj  intramuscular or subcutaneous [34742]

## 2010-04-16 NOTE — Progress Notes (Signed)
Summary: goit  Phone Note Call from Patient   Summary of Call: has gout really bad and needs to get rxs called in for this. [phone] Initial call taken by: Rudene Anda,  May 14, 2009 11:15 AM  Follow-up for Phone Call        OK to refill pts indocin.  Appt if no improv. Follow-up by: Esperanza Sheets PA,  May 14, 2009 11:42 AM  Additional Follow-up for Phone Call Additional follow up Details #1::        patient wife aware Additional Follow-up by: Adella Hare LPN,  May 14, 2009 11:44 AM    Prescriptions: INDOMETHACIN 25 MG CAPS (INDOMETHACIN) one tablet three times daily x 2 days, then one tablet twice daily x 2 days then one daily x 3 days, then stop  #13 x 0   Entered by:   Adella Hare LPN   Authorized by:   Esperanza Sheets PA   Signed by:   Adella Hare LPN on 60/73/7106   Method used:   Electronically to        [facility]  [address] (retail)       [address]       [place], [place]  [address]       Ph: [phone]       Fax: [phone]   RxID:   [id number]

## 2010-04-16 NOTE — Progress Notes (Signed)
Summary: MEDS  Phone Note Call from Patient   Summary of Call: Todd Randall SAID HE NEEDS  HIS BP FAXED TO [facility] AND IF HAVE ANY SAMPLES OF SYMBICORT HE NEEDS  IT WHILE IN [place]  PLEASE FAX HIS BP  Initial call taken by: Lind Guest,  August 16, 2008 9:31 AM      Prescriptions: NORVASC 5 MG TABS (AMLODIPINE BESYLATE) one tab by mouth qhs  #90 x 3   Entered by:   Everitt Amber   Authorized by:   Syliva Overman MD   Signed by:   Everitt Amber on 08/16/2008   Method used:   Electronically to        [facility]  [address] (retail)       [address]       [place], [place]  [address]       Ph: [phone]       Fax: [phone]   RxID:   [id number] LOTENSIN 20 MG TABS (BENAZEPRIL HCL) TWO TABS by mouth QD  #60 Each x 2   Entered by:   Everitt Amber   Authorized by:   Syliva Overman MD   Signed by:   Everitt Amber on 08/16/2008   Method used:   Electronically to        [facility]  [address] (retail)       [address]       [place], [place]  [address]       Ph: [phone]       Fax: [phone]   RxID:   [id number] MAXZIDE-25 37.5-25 MG  TABS (TRIAMTERENE-HCTZ) one tab by mouth once daily  #30 x 2   Entered by:   Everitt Amber   Authorized by:   Syliva Overman MD   Signed by:   Everitt Amber on 08/16/2008   Method used:   Electronically to        [facility]  [address] (retail)       [address]       [place], [place]  [address]       Ph: [phone]       Fax: [phone]   RxID:   [id number]

## 2010-04-16 NOTE — Progress Notes (Signed)
Summary: INJECTION  Phone Note Call from Patient   Summary of Call: WANTS TO KNOW CAN HE GET A SHOT IN HIS FOOT  CALL BACK AT [phone] Initial call taken by: Lind Guest,  December 28, 2008 1:13 PM  Follow-up for Phone Call        returned call, left message Follow-up by: Worthy Keeler LPN,  December 28, 2008 1:40 PM  Additional Follow-up for Phone Call Additional follow up Details #1::        shot for gout? Additional Follow-up by: Worthy Keeler LPN,  December 28, 2008 3:33 PM    Additional Follow-up for Phone Call Additional follow up Details #2::    pt advised that prednisone dose pack [facility]6 days duration. No needot come to the office unless no better next week Follow-up by: Syliva Overman MD,  December 28, 2008 5:48 PM  New/Updated Medications: PREDNISONE (PAK) 5 MG TABS (PREDNISONE) Use as directed INDOMETHACIN 25 MG CAPS (INDOMETHACIN) one tablet three times daily x 2 days, then one tablet twice daily x 2 days then one daily x 3 days, then stop Prescriptions: INDOMETHACIN 25 MG CAPS (INDOMETHACIN) one tablet three times daily x 2 days, then one tablet twice daily x 2 days then one daily x 3 days, then stop  #13 x 0   Entered and Authorized by:   Syliva Overman MD   Signed by:   Syliva Overman MD on 12/28/2008   Method used:   Electronically to        [facility]  [address] (retail)       [address]       [place], [place]  [address]       Ph: [phone]       Fax: [phone]   RxID:   [id number] PREDNISONE (PAK) 5 MG TABS (PREDNISONE) Use as directed  #21 x 0   Entered and Authorized by:   Syliva Overman MD   Signed by:   Syliva Overman MD on 12/28/2008   Method used:   Electronically to        [facility]  [address] (retail)       [address]       [place], [place]  [address]       Ph: [phone]       Fax: [phone]   RxID:   [id number]

## 2010-04-16 NOTE — Progress Notes (Signed)
Summary: inhaler  Phone Note Call from Patient   Summary of Call: wants to know if we have a plastic thing for inhaler.  [phone] Initial call taken by: Rudene Anda,  September 27, 2008 3:11 PM  Follow-up for Phone Call        NO WE DO NOT Follow-up by: Syliva Overman MD,  September 27, 2008 5:35 PM  Additional Follow-up for Phone Call Additional follow up Details #1::        returned call and left message Additional Follow-up by: Worthy Keeler LPN,  September 28, 2008 8:55 AM    Additional Follow-up for Phone Call Additional follow up Details #2::    patient in office today Follow-up by: Worthy Keeler LPN,  September 29, 2008 10:06 AM

## 2010-04-16 NOTE — Assessment & Plan Note (Signed)
Summary: OFFICE VISIT   Vital Signs:  Patient profile:   55 year old male Height:      67 inches Weight:      199 pounds BMI:     31.28 O2 Sat:      93 % Pulse rate:   106 / minute Pulse rhythm:   regular Resp:     16 per minute BP sitting:   140 / 100  (left arm) Cuff size:   large  Vitals Entered By: Everitt Amber (October 25, 2008 1:27 PM)  Nutrition Counseling: Patient's BMI is greater than 25 and therefore counseled on weight management options. CC: Follow up chronic problems   CC:  Follow up chronic problems.  History of Present Illness: Pt is now 2 months alcohol freeAND OVERLLL FEELS BETTER THAN HE HAS IN THE PAST. hE ENJOYS ATRTENDING aa MEETINGS WHICH HE REGULARLY DOESW. He states he is smokin more than he previously was and wants to change this. He had been having mood instability and irritability, but this is imporoving over timeHe is also on medication for this through psychiatry.   Preventive Screening-Counseling & Management  Alcohol-Tobacco     Smoking Cessation Counseling: yes  Current Medications (verified): 1)  Maxzide-25 37.5-25 Mg  Tabs (Triamterene-Hctz) .... One Tab By Mouth Once Daily 2)  Lotensin 20 Mg Tabs (Benazepril Hcl) .... Two Tabs By Mouth Qd 3)  Advair Diskus 250-50 Mcg/dose Misc (Fluticasone-Salmeterol) .... Two Puffs Bid 4)  Spiriva Handihaler 18 Mcg Caps (Tiotropium Bromide Monohydrate) .... Two Puffs Qd 5)  Cardura 4 Mg Tabs (Doxazosin Mesylate) .... Two Tabs By Mouth At Bedtime 6)  Symbicort 160-4.5 Mcg/act Aero (Budesonide-Formoterol Fumarate) .... Two Puffs By Mouth Bid 7)  Ventolin Hfa 108 (90 Base) Mcg/act Aers (Albuterol Sulfate) .... Two Puffs Every 4-6 Hours Prn 8)  Mirtazapine 30 Mg Tabs (Mirtazapine) .Marland Kitchen.. 1 Tab At Bedtime 9)  Lamotrigine 100 Mg Tabs (Lamotrigine) .... Take 1/2 Tab  Every Morning  Allergies (verified): No Known Drug Allergies  Review of Systems      See HPI General:  Complains of fatigue; denies chills and  fever. ENT:  Denies hoarseness and nasal congestion. CV:  Complains of chest pain or discomfort, leg cramps with exertion, and palpitations; Pt has noted in the past 3 months that he has chest pain  with activity, no associated symoptoms goes awaywith rest, substernal tightness. Resp:  Denies cough and shortness of breath. GI:  Denies abdominal pain, constipation, diarrhea, nausea, and vomiting. GU:  Denies dysuria and urinary frequency. MS:  Denies joint pain and stiffness. Neuro:  Denies headaches. Psych:  Complains of anxiety and depression; [facility] has seen by psych once, he has f/u in 3 the next month. Endo:  Denies cold intolerance, excessive hunger, excessive thirst, excessive urination, heat intolerance, polyuria, and weight change. Heme:  Denies abnormal bruising and bleeding. Allergy:  Denies seasonal allergies.  Physical Exam  General:  alert, well-hydrated, and overweight-appearing.  HEENT: No facial asymmetry,  EOMI, No sinus tenderness, TM's Clear, oropharynx  pink and moist.   Chest: Clear to auscultation bilaterally.  CVS: S1, S2, No murmurs, No S3.   Abd: Soft, Nontender.  MS: Adequate ROM spine, hips, shoulders and knees.  Ext: No edema.   CNS: CN 2-12 intact, power tone and sensation normal throughout.   Skin: Intact, no visible lesions or rashes.  Psych: Good eye contact, normal affect.  Memory intact, not anxious or depressed  appearing.     Impression & Recommendations:  Problem # 1:  CHEST PAIN UNSPECIFIED (ICD-786.50) Assessment Comment Only  Orders: EKG w/ Interpretation (93000)abn EKG with questionable old infarct, pt reports chest pain with activity , will refer to card Cardiology Referral (Cardiology)  Problem # 2:  NICOTINE ADDICTION (ICD-305.1) Assessment: Deteriorated  Encouraged smoking cessation and discussed different methods for smoking cessation.   Problem # 3:  HYPERTENSION  (ICD-401.9) Assessment: Deteriorated  The following medications were removed from the medication list:    Maxzide-25 37.5-25 Mg Tabs (Triamterene-hctz) ..... One tab by mouth once daily His updated medication list for this problem includes:    Lotensin 20 Mg Tabs (Benazepril hcl) .Marland Kitchen..Marland Kitchen Two tabs by mouth qd    Cardura 4 Mg Tabs (Doxazosin mesylate) .Marland Kitchen..Marland Kitchen Two tabs by mouth at bedtime    Maxzide 75-50 Mg Tabs (Triamterene-hctz) .Marland Kitchen... Take 1 tablet by mouth once a day  BP today: 140/100 Prior BP: 150/80 (09/29/2008)  Problem # 4:  ASTHMA (ICD-493.90) Assessment: Unchanged  His updated medication list for this problem includes:    Advair Diskus 250-50 Mcg/dose Misc (Fluticasone-salmeterol) .Marland Kitchen..Marland Kitchen Two puffs bid    Spiriva Handihaler 18 Mcg Caps (Tiotropium bromide monohydrate) .Marland Kitchen..Marland Kitchen Two puffs qd    Symbicort 160-4.5 Mcg/act Aero (Budesonide-formoterol fumarate) .Marland Kitchen..Marland Kitchen Two puffs by mouth bid    Ventolin Hfa 108 (90 Base) Mcg/act Aers (Albuterol sulfate) .Marland Kitchen..Marland Kitchen Two puffs every 4-6 hours prn  Complete Medication List: 1)  Lotensin 20 Mg Tabs (Benazepril hcl) .... Two tabs by mouth qd 2)  Advair Diskus 250-50 Mcg/dose Misc (Fluticasone-salmeterol) .... Two puffs bid 3)  Spiriva Handihaler 18 Mcg Caps (Tiotropium bromide monohydrate) .... Two puffs qd 4)  Cardura 4 Mg Tabs (Doxazosin mesylate) .... Two tabs by mouth at bedtime 5)  Symbicort 160-4.5 Mcg/act Aero (Budesonide-formoterol fumarate) .... Two puffs by mouth bid 6)  Ventolin Hfa 108 (90 Base) Mcg/act Aers (Albuterol sulfate) .... Two puffs every 4-6 hours prn 7)  Mirtazapine 30 Mg Tabs (Mirtazapine) .Marland Kitchen.. 1 tab at bedtime 8)  Lamotrigine 100 Mg Tabs (Lamotrigine) .... Take 1/2 tab  every morning 9)  Maxzide 75-50 Mg Tabs (Triamterene-hctz) .... Take 1 tablet by mouth once a day  Patient Instructions: 1)  Please schedule a follow-up appointment in 3 months. 2)  Tobacco is very bad for your health and your loved ones! You Should stop  smoking!. 3)  Stop Smoking Tips: Choose a Quit date. Cut down before the Quit date. decide what you will do as a substitute when you feel the urge to smoke(gum,toothpick,exercise). 4)  It is important that you exercise regularly at least 20 minutes 5 times a week. If you develop chest pain, have severe difficulty breathing, or feel very tired , stop exercising immediately and seek medical attention. 5)  You need to lose weight. Consider a lower calorie diet and regular exercise.  6)  Your blood pressure is too high the maxzide is increased to 50mg  daily  Prescriptions: MAXZIDE 75-50 MG TABS (TRIAMTERENE-HCTZ) Take 1 tablet by mouth once a day  #30 x 4   Entered and Authorized by:   Syliva Overman MD   Signed by:   Syliva Overman MD on 10/25/2008   Method used:   Electronically to        [facility]  [address] ([facility]ail)       [address]       [place], [place]  [address]  Ph: [phone]       Fax: [phone]   RxID:   [id number]

## 2010-04-16 NOTE — Letter (Signed)
Summary: Letter  Letter   Imported By: Lind Guest 08/16/2008 10:19:09  _____________________________________________________________________  External Attachment:    Type:   Image     Comment:   External Document

## 2010-06-24 LAB — BASIC METABOLIC PANEL
BUN: 13 mg/dL (ref 6–23)
CO2: 29 mEq/L (ref 19–32)
Calcium: 9 mg/dL (ref 8.4–10.5)
Chloride: 97 mEq/L (ref 96–112)
Creatinine, Ser: 1.49 mg/dL (ref 0.4–1.5)
GFR calc non Af Amer: 50 mL/min — ABNORMAL LOW (ref >60)
Glucose, Bld: 105 mg/dL — ABNORMAL HIGH (ref 70–99)
Potassium: 3.7 mEq/L (ref 3.5–5.1)
Sodium: 138 mEq/L (ref 135–145)

## 2010-06-24 LAB — DIFFERENTIAL
Basophils Absolute: 0 10*3/uL (ref 0.0–0.1)
Basophils Relative: 0 % (ref 0–1)
Eosinophils Absolute: 0.1 10*3/uL (ref 0.0–0.7)
Eosinophils Relative: 3 % (ref 0–5)
Lymphocytes Relative: 34 % (ref 12–46)
Lymphs Abs: 1.1 10*3/uL (ref 0.7–4.0)
Monocytes Absolute: 0.4 10*3/uL (ref 0.1–1.0)
Monocytes Relative: 13 % — ABNORMAL HIGH (ref 3–12)
Neutro Abs: 1.7 10*3/uL (ref 1.7–7.7)
Neutrophils Relative %: 50 % (ref 43–77)

## 2010-06-24 LAB — CBC
HCT: 36.9 % — ABNORMAL LOW (ref 39.0–52.0)
Hemoglobin: 12.8 g/dL — ABNORMAL LOW (ref 13.0–17.0)
MCHC: 34.7 g/dL (ref 30.0–36.0)
MCV: 95.3 fL (ref 78.0–100.0)
Platelets: 145 10*3/uL — ABNORMAL LOW (ref 150–400)
RBC: 3.87 MIL/uL — ABNORMAL LOW (ref 4.22–5.81)
RDW: 15.5 % (ref 11.5–15.5)
WBC: 3.3 10*3/uL — ABNORMAL LOW (ref 4.0–10.5)

## 2010-06-24 LAB — ETHANOL

## 2010-08-02 NOTE — H&P (Signed)
NAME:  Todd Randall, Todd Randall                            ACCOUNT NO.:  [id number]   MEDICAL RECORD NO.:  [id number]                   PATIENT TYPE:  INP   LOCATION:  NA                                   FACILITY:  [facility]   PHYSICIAN:  Ollen Gross, MD                   DATE OF BIRTH:  [birth year]   DATE OF ADMISSION:  01/03/2002  DATE OF DISCHARGE:                                HISTORY & PHYSICAL   CHIEF COMPLAINT:  Right hip pain.   HISTORY OF PRESENT ILLNESS:  The patient is a 55 year old male who has been  seen in consultation by Dr. Ollen Gross for ongoing right hip pain.  He  has a known history of avascular necrosis of the right hip.  Over the past  six months, the hip pain has been much more progressive.  The pain is in and  around the buttock and through the groin, and travels down to his knee.  He  has started to have pain even at rest at this point.  He has been out of  work due to severe discomfort.  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  It is felt he is a surgical  candidate.  Risks and benefits of the right hip replacement arthroplasty  have been discussed with the patient, and he has elected to proceed with  surgery.   ALLERGIES:  No known drug allergies.   MEDICATIONS:  1. OxyContin 40 mg b.i.d.  2. Percocet 10 mg/325 mg b.i.d. to q.i.d. p.r.n. pain.  3. Doxazosin 4 mg p.o. q.d.  4. Cardizem LA 240 mg p.o. q.d.  5. Combivent 2 puffs b.i.d.  6. Advair 100/50 one puff q.d.  7. AeroBid two puffs q.d.   PAST MEDICAL HISTORY:  1. Prior history of moderate alcohol intake, approximately nine years ago.  2. History of bronchitis.  3. Hypertension.   PAST SURGICAL HISTORY:  Cystmoidectomy first toe in 7/02.   SOCIAL HISTORY:  He is married.  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  One pack per day smoker.  Still has approximately two to three drinks of  beer daily.  His wife will be assisting him with his care  after surgery.  He  has a one story home with two steps coming into the house.   FAMILY HISTORY:  Mother age 53 with a history of colon cancer.  Father age  40 with a history of prostate cancer.   REVIEW OF SYMPTOMS:  GENERAL:  No fevers, chills, night sweats.  NEUROLOGIC:  No seizures, syncope, or paralysis.  RESPIRATORY:  Has had some intermittent  cough with a history of bronchitis, no shortness of breath or hemoptysis.  CARDIOVASCULAR:  No chest pain, angina, or orthopnea.  GASTROINTESTINAL:  No  nausea, vomiting, diarrhea, or constipation.  No blood or mucous  in the  stool.  GENITOURINARY:  No dysuria, hematuria, or discharge.  MUSCULOSKELETAL:  Pertinent to the right hip found in the history of present  illness.   PHYSICAL EXAMINATION:  VITAL SIGNS:  Pulse 72, respirations 16, blood  pressure 130/100.  GENERAL:  The patient is a 55 year old African-American male, well-  developed, well-nourished, in no acute distress.  He does ambulate with a  cane, antalgic gait.  He is alert, oriented, and cooperative.  HEENT:  Normocephalic, atraumatic.  Pupils are round and reactive.  Extraocular movements were intact.  Oropharynx clear.  NECK:  Supple.  CHEST:  Clear to auscultation anterior and posterior chest walls.  No  wheezes, rhonchi, or rales.  HEART:  Regular rate and rhythm.  No murmurs.  Split S1 and normal S2.  ABDOMEN:  Soft, nontender, bowel sounds are present.  RECTAL:  Not done, not pertinent to present illness.  BREASTS:  Not done, not pertinent to present illness.  GENITALIA:  Not done, not pertinent to present illness.  EXTREMITIES:  Significant to that of the right hip.  He has flexion about 90  degrees, internal rotation of 20, external rotation of 30, abduction of 30.  He has positive crepitus noted on motion.  He has approximately a 1/4 to 1/2  inch shortening on the right as compared to the left.   IMPRESSION:  1. Avascular necrosis, right hip.  2. Past history  of alcohol use abuse.  3. History of bronchitis.  4. Hypertension.   PLAN:  The patient will be admitted to [facility] to undergo a  right total hip replacement arthroplasty.  Surgery will be performed by Dr.  Ollen Gross.  The patient's medical physician is Dr. Mirna Mires in  [place].  He will be notified if needed for any medical information  concerning the patient.       Alexzandrew L. Julien Girt, P.A.              Ollen Gross, MD    ALP/MEDQ  D:  12/30/2001  T:  12/30/2001  Job:  [id number]   cc:   Annia Friendly. Loleta Chance, M.D.  [id number]  [place]  [place] [address]  Fax: [phone]

## 2010-08-02 NOTE — Discharge Summary (Signed)
NAME:  Todd Randall, Todd Randall                            ACCOUNT NO.:  [id number]   MEDICAL RECORD NO.:  [id number]                   PATIENT TYPE:  INP   LOCATION:  0468                                 FACILITY:  [facility]   PHYSICIAN:  Ollen Gross, M.D.                 DATE OF BIRTH:  03-30-1955   DATE OF ADMISSION:  01/03/2002  DATE OF DISCHARGE:  01/06/2002                                 DISCHARGE SUMMARY   ADMITTING DIAGNOSES:  1. Avascular necrosis right hip.  2. Past history of alcohol use/abuse.  3. History of bronchitis.  4. Hypertension.   DISCHARGE DIAGNOSES:  1. Avascular necrosis right hip status post right total hip replacement     arthroplasty.  2. Past history of alcohol use/abuse.  3. History of bronchitis.  4. Hypertension.   PROCEDURE:  The patient was taken to the OR on January 03, 2002.  [place] a right  total hip arthroplasty.  Surgeon Dr. Homero Fellers Aluisio.  Assistant Avel Peace,  P.A.-C.  Surgery under spinal anesthesia.  Hemovac drain x1.   BRIEF HISTORY:  The patient is a 55 year old male who has been seen by Dr.  Lequita Halt for ongoing right hip pain.  He has a known history of avascular  necrosis.  Pain has been much more progressive the past several months.  It  is felt he has reached a point where he would benefit from undergoing  surgery.  Risks and benefits of procedure discussed with the patient.  He  has elected to proceed with surgery.   LABORATORY DATA:  CBC on admission hemoglobin 12.6, hematocrit 37.8, white  cell count 6.8, red cell count 4.24.  Differential within normal limits.  Postoperative H&H 11.1, 33.5.  Last noted H&H 9.0 and 30.0.  PT/PTT on  admission were 13.3 and 42, respectively with an INR of 1.0.  Serial pro  times followed per Coumadin protocol.  Last noted PT/INR 18.1 and 1.6.  Chem  panel on admission all within normal limits.  Follow-up BMET only minimally  elevated glucose of 132 and remaining BMET within normal limits.  Urinalysis  on  admission trace ketones, otherwise negative.  Blood group type O+.   EKG dated December 28, 2001 normal sinus rhythm, normal EKG.  No old tracing  to compare.  Confirmed by Dr. Jerral Bonito.  Chest x-ray preoperative dated  December 28, 2001 normal chest.  Right hip films dated December 28, 2001  aseptic necrosis right femoral head.  Postoperative hip film on January 03, 2002 anatomic alignment status post right total hip arthroplasty without  acute complications.   HOSPITAL COURSE:  The patient was admitted to [facility] on  January 03, 2002.  Taken to the OR.  Underwent above stated procedure  without complications.  The patient tolerated procedure well and was later  transferred to the recovery room and then to the orthopedic floor  for  continued postoperative care.  The patient placed on PCA analgesics for pain  following surgery 24 hours postoperative IV antibiotics.  PT and OT was  consulted to assist with gait training ambulation and ADLs.  The patient did  very well with his pain control following surgery.  He was weaned off PCA  analgesics over to p.o. medications by postoperative day two.  Hemovac drain  placed at time of surgery was pulled on postoperative day one without  difficulty.  He was placed on touchdown weightbearing.  The patient  progressed very well in physical therapy.  He had obtained good pain  control.  By postoperative day three he was up ambulating over 200 feet  within three days after surgery.  He was doing quite well and discharged  home.   DISCHARGE PLAN:  Discharged home on January 06, 2002.   DISCHARGE DIAGNOSES:  Please see above.   DISCHARGE MEDICATIONS:  Colace, Trinsicon, Coumadin, Percocet,. Robaxin.   ACTIVITY:  Touchdown weightbearing, hip precautions.  Home health PT, home  health nursing through [facility].  Follow up two weeks from surgery.   DISPOSITION:  Home.   CONDITION ON DISCHARGE:  Improved.     Alexzandrew L.  Julien Girt, P.A.              Ollen Gross, M.D.    ALP/MEDQ  D:  01/26/2002  T:  01/26/2002  Job:  [id number]

## 2010-08-02 NOTE — Op Note (Signed)
NAME:  BLANTON, KARDELL                            ACCOUNT NO.:  [id number]   MEDICAL RECORD NO.:  [id number]                   PATIENT TYPE:  INP   LOCATION:  0009                                 FACILITY:  [facility]   PHYSICIAN:  Ollen Gross, MD                   DATE OF BIRTH:  1956/02/01   DATE OF PROCEDURE:  01/03/2002  DATE OF DISCHARGE:                                 OPERATIVE REPORT   PREOPERATIVE DIAGNOSES:  Avascular necrosis, right hip.   POSTOPERATIVE DIAGNOSES:  Avascular necrosis, right hip.   PROCEDURE:  Right total hip arthroplasty.   SURGEON:  Ollen Gross, MD   ASSISTANT:  Alexzandrew L. Julien Girt, P.A.   ANESTHESIA:  Spinal.   ESTIMATED BLOOD LOSS:  200   DRAINS:  Hemovac x1.   COMPLICATIONS:  None.   CONDITION:  Stable to recovery.   BRIEF CLINICAL NOTE:  Ory is a 55 year old male with severe right hip pain  with severe avascular necrosis and collapse of his femoral head. 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 He has had pain refractory to nonoperative management and  has involvement of almost his whole femoral head on MRI. He presents now for  total hip arthroplasty.   DESCRIPTION OF PROCEDURE:  After successful administration of spinal  anesthetic, the patient was placed in the left lateral decubitus position  with the right side up and held up with the hip positioner. The right lower  extremity was isolated from his perineum with plastic drapes and prepped and  draped in the usual sterile fashion. A mini posterolateral incision was made  with a 10 blade through subcutaneous tissue to the level of the fascia lata  which was incised in line with the skin incision. The sciatic nerve was  palpated and protected and short external rotators isolated with the femur.  Capsulectomy was performed. The hip was dislocated, center of the femoral  head marked. Trial prosthesis placed such that the center of the trial head  corresponds to the center of his native femoral  head. Osteotomy line is  marked on the femoral neck and osteotomy made with an oscillating saw. The  femur is then retracted anteriorly and femoral head removed. He had a  massive cartilage flap and delamination of almost the entire cartilaginous  surface off the bony head. The acetabulum was then exposed and acetabular  reaming started at 45 coursing in increments of 2 to 49. A 50 mm pinnacle  acetabular shell was placed in the anatomic position and transfixed with a  dome screw. The trial 28 mm neutral liner is then placed.   The proximal femur is prepared with the canal finder and then irrigated.  Axial reaming is performed to 13.5 mm proximal remaining up to 18B and the  sleeve machine to a small. An 18B small sleeve is placed with an 18 x 13  stem,  36 plus 8 neck and anatomic position. The 28 plus zero head is then  placed, hip reduced with excellent stability, full extension flexion and  rotation, 70 degrees flexion, 40 degrees abduction, 90 degrees internal  rotation and 90 degrees flexion, 70 degrees internal rotation. All trials  then removed and the permanent apex hole eliminator placed into the  acetabular shell. A permanent 28 mm neutral marathon liner is placed. The  18D small sleeve, 18 x 13 stem, 36 plus 8 neck placed matching his native  anteversion. A permanent 28 plus zero head is placed, hip reduced with the  same stability parameters. The wound was copiously irrigated with antibiotic  solution, short external rotators reattached to the femur through drill  holes. The fascia lata was closed a Hemovac drain with interrupted #1  Vicryl, subcu closed with interrupted 2-0 Vicryl, subcuticular with running  4-0 Monocryl. The incision was cleaned and dried and Steri-Strips and a  bulky sterile dressing applied. Drains hooked to suction, placed into a knee  immobilizer, patient awakened and transported to recovery in stable  condition.                                                Ollen Gross, MD    FA/MEDQ  D:  01/03/2002  T:  01/03/2002  Job:  [id number]

## 2010-08-02 NOTE — Op Note (Signed)
NAME:  Todd Randall, Todd Randall                  ACCOUNT NO.:  [id number]   MEDICAL RECORD NO.:  [id number]          PATIENT TYPE:  AMB   LOCATION:  DAY                           FACILITY:  [facility]   PHYSICIAN:  Kassie Mends, M.D.      DATE OF BIRTH:  04-21-1955   DATE OF PROCEDURE:  11/03/2005  DATE OF DISCHARGE:                                 OPERATIVE REPORT   PROCEDURE:  Colonoscopy with snare polypectomy and cold forceps polypectomy.   INDICATIONS FOR EXAM:  Mr. Mostafa is a 55 year old male who presents for  average-risk colon cancer screening.   FINDINGS:  1. A 4-mm sessile ascending colon polyp, removed via cold forceps.  2. A 6-mm sessile proximal transverse colon polyp, removed via snare      cautery (25 W).  3. Otherwise, no diverticula, masses, inflammatory changes, or vascular      ectasia seen.  4. Normal retroflexed view of the rectum.   RECOMMENDATIONS:  1. Follow up biopsies.  If polyps adenomatous, then he needs a screening      colonoscopy in five years.  His first-degree relatives should have      colon cancer screening beginning at age 21.  2. Follow up with Dr. Tye Savoy.   PROCEDURE TECHNIQUE:  A physical exam was performed and informed consent was  obtained from the patient after explaining all the benefits, risks and  alternatives to the procedure.  The patient was connected to the monitor and  placed in the left lateral position.  Continuous oxygen was provided by  nasal cannula and IV medicine administered through an indwelling cannula.  After administration of sedation and rectal exam, the scope was advanced  under direct visualization to the cecum.  The scope was subsequently removed  slowly by carefully examining the color, texture, anatomy, and integrity of  the mucosa on the way out.  The patient was recovered in the endoscopy suite  and discharged to home in satisfactory condition.      Kassie Mends, M.D.  Electronically Signed     SM/MEDQ  D:   11/03/2005  T:  11/03/2005  Job:  [id number]   cc:   Milus Mallick. Lodema Hong, M.D.  Fax: [phone]

## 2010-09-12 ENCOUNTER — Emergency Department (HOSPITAL_COMMUNITY)
Admission: EM | Admit: 2010-09-12 | Discharge: 2010-09-12 | Disposition: A | Discharge status: Home / Self Care | Payer: Self-pay | Attending: Emergency Medicine | Admitting: Emergency Medicine

## 2010-09-12 DIAGNOSIS — J4489 Other specified chronic obstructive pulmonary disease: Secondary | ICD-10-CM | POA: Insufficient documentation

## 2010-09-12 DIAGNOSIS — I10 Essential (primary) hypertension: Secondary | ICD-10-CM | POA: Insufficient documentation

## 2010-09-12 DIAGNOSIS — M109 Gout, unspecified: Secondary | ICD-10-CM | POA: Insufficient documentation

## 2010-09-12 DIAGNOSIS — J449 Chronic obstructive pulmonary disease, unspecified: Secondary | ICD-10-CM | POA: Insufficient documentation

## 2010-09-12 DIAGNOSIS — Z79899 Other long term (current) drug therapy: Secondary | ICD-10-CM | POA: Insufficient documentation

## 2010-09-12 DIAGNOSIS — M25539 Pain in unspecified wrist: Secondary | ICD-10-CM | POA: Insufficient documentation

## 2010-10-16 ENCOUNTER — Encounter: Payer: Self-pay | Admitting: Gastroenterology

## 2010-12-12 LAB — RAPID URINE DRUG SCREEN, HOSP PERFORMED
Amphetamines: NOT DETECTED
Benzodiazepines: NOT DETECTED
Cocaine: NOT DETECTED
Opiates: NOT DETECTED
Tetrahydrocannabinol: NOT DETECTED

## 2010-12-12 LAB — ETHANOL

## 2010-12-12 LAB — CBC
HCT: 34.7 — ABNORMAL LOW
Hemoglobin: 11.6 — ABNORMAL LOW
MCHC: 33.5
MCV: 94.1
Platelets: 166
RBC: 3.69 — ABNORMAL LOW
RDW: 13.7
WBC: 4.6

## 2010-12-12 LAB — COMPREHENSIVE METABOLIC PANEL
ALT: 46
AST: 81 — ABNORMAL HIGH
Albumin: 3.9
Alkaline Phosphatase: 57
BUN: 4 — ABNORMAL LOW
CO2: 26
Calcium: 9.7
Chloride: 101
Creatinine, Ser: 1.11
GFR calc non Af Amer: >60
Glucose, Bld: 113 — ABNORMAL HIGH
Potassium: 4.1
Sodium: 136
Total Bilirubin: 0.8
Total Protein: 6.7

## 2010-12-12 LAB — DIFFERENTIAL
Basophils Absolute: 0
Basophils Relative: 1
Eosinophils Absolute: 0.2
Eosinophils Relative: 3
Lymphocytes Relative: 23
Lymphs Abs: 1
Monocytes Absolute: 0.6
Monocytes Relative: 14 — ABNORMAL HIGH
Neutro Abs: 2.7
Neutrophils Relative %: 59

## 2010-12-12 LAB — URINALYSIS, ROUTINE W REFLEX MICROSCOPIC
Bilirubin Urine: NEGATIVE
Glucose, UA: NEGATIVE
Hgb urine dipstick: NEGATIVE
Ketones, ur: NEGATIVE
Nitrite: NEGATIVE
Protein, ur: NEGATIVE
Specific Gravity, Urine: <1.005 — ABNORMAL LOW
Urobilinogen, UA: 0.2
pH: 6

## 2010-12-12 LAB — LIPASE, BLOOD: Lipase: 20

## 2012-08-02 ENCOUNTER — Encounter: Payer: Self-pay | Admitting: Family Medicine

## 2012-08-02 ENCOUNTER — Ambulatory Visit (INDEPENDENT_AMBULATORY_CARE_PROVIDER_SITE_OTHER): Payer: BC Managed Care – PPO | Admitting: Family Medicine

## 2012-08-02 VITALS — BP 122/80 | HR 84 | Resp 16 | Wt 214.0 lb

## 2012-08-02 DIAGNOSIS — Z131 Encounter for screening for diabetes mellitus: Secondary | ICD-10-CM

## 2012-08-02 DIAGNOSIS — F17200 Nicotine dependence, unspecified, uncomplicated: Secondary | ICD-10-CM

## 2012-08-02 DIAGNOSIS — E669 Obesity, unspecified: Secondary | ICD-10-CM

## 2012-08-02 DIAGNOSIS — I10 Essential (primary) hypertension: Secondary | ICD-10-CM

## 2012-08-02 DIAGNOSIS — E66811 Obesity, class 1: Secondary | ICD-10-CM

## 2012-08-02 DIAGNOSIS — Z125 Encounter for screening for malignant neoplasm of prostate: Secondary | ICD-10-CM

## 2012-08-02 DIAGNOSIS — Z1329 Encounter for screening for other suspected endocrine disorder: Secondary | ICD-10-CM

## 2012-08-02 DIAGNOSIS — M109 Gout, unspecified: Secondary | ICD-10-CM

## 2012-08-02 DIAGNOSIS — J4489 Other specified chronic obstructive pulmonary disease: Secondary | ICD-10-CM

## 2012-08-02 DIAGNOSIS — Z23 Encounter for immunization: Secondary | ICD-10-CM

## 2012-08-02 DIAGNOSIS — Z1211 Encounter for screening for malignant neoplasm of colon: Secondary | ICD-10-CM

## 2012-08-02 DIAGNOSIS — Z13220 Encounter for screening for lipoid disorders: Secondary | ICD-10-CM

## 2012-08-02 DIAGNOSIS — J449 Chronic obstructive pulmonary disease, unspecified: Secondary | ICD-10-CM

## 2012-08-02 NOTE — Assessment & Plan Note (Signed)
Now using electronic cigarettes for approx 2 years

## 2012-08-02 NOTE — Assessment & Plan Note (Signed)
No recent flare, will check uric acid level

## 2012-08-02 NOTE — Assessment & Plan Note (Signed)
Still relies on inhalers for assistance with breathing

## 2012-08-02 NOTE — Patient Instructions (Addendum)
CPE in   4 to  6 weeks    You are referred for screening colonoscopy to Dr Karilyn Cota and will need one at least every 5 years based on maternal history    Congrats on remaining as good as you are, you need to wean off electric cigarettes, insufficient safety data, an you will get info on smoking cessation classes at health dpt with free aids available     It is important that you exercise regularly at least 30 minutes 5 times a week. If you develop chest pain, have severe difficulty breathing, or feel very tired, stop exercising immediately and seek medical attention   Aim for 2 pounds per month weight loss goal  START aspirin 81 mg one daily to reduce risk of heart disease, please ] Fasting cbc, lipid, cmp,, HBA1C, TSH and PSA as soon as possible  1 multivitamin daily is also recommended

## 2012-08-02 NOTE — Assessment & Plan Note (Signed)
Deteriorated. Patient re-educated about  the importance of commitment to a  minimum of 150 minutes of exercise per week. The importance of healthy food choices with portion control discussed. Encouraged to start a food diary, count calories and to consider  joining a support group. Sample diet sheets offered. Goals set by the patient for the next several months.    

## 2012-08-02 NOTE — Progress Notes (Signed)
  Subjective:    Patient ID: Todd Randall, male    DOB: 1955/06/22, 57 y.o.   MRN: [id number]  HPI The PT is here for follow up, to re establish care  and re-evaluation of chronic medical conditions, medication management and review of any available recent lab and radiology data.  Preventive health is updated, specifically  Cancer screening and Immunization.   The PT denies any adverse reactions to current medications since the last visit.  Requests re evaluation for colonoscopy due to positive family history, and no testing for over 5 years. Father had prostate cancer at young age and he ants to be checked for this also Remains alcohol free and has converted from cigarettes to electronic cigarettes,needs to quit this also    Review of Systems See HPI Denies recent fever or chills. Denies sinus pressure, nasal congestion, ear pain or sore throat. Denies chest congestion, productive cough or wheezing. Denies chest pains, palpitations and leg swelling Denies abdominal pain, nausea, vomiting,diarrhea or constipation.  No change in bowel movments Denies dysuria, frequency, hesitancy or incontinence. Denies joint pain, swelling and limitation in mobility. Denies headaches, seizures, numbness, or tingling. Denies depression, anxiety or insomnia. Denies skin break down or rash.        Objective:   Physical Exam  Patient alert and oriented and in no cardiopulmonary distress.  HEENT: No facial asymmetry, EOMI, no sinus tenderness,  oropharynx pink and moist.  Neck supple no adenopathy.  Chest: Clear to auscultation bilaterally.Decrease air entry throughout  CVS: S1, S2 no murmurs, no S3.  ABD: Soft obese non tender. Bowel sounds normal.  Ext: No edema  MS: Adequate ROM spine, shoulders, hips and knees.  Skin: Intact, no ulcerations or rash noted.  Psych: Good eye contact, normal affect. Memory intact not anxious or depressed appearing.  CNS: CN 2-12 intact, power, tone and  sensation normal throughout.       Assessment & Plan:

## 2012-08-02 NOTE — Assessment & Plan Note (Signed)
Controlled, no change in medication DASH diet and commitment to daily physical activity for a minimum of 30 minutes discussed and encouraged, as a part of hypertension management. The importance of attaining a healthy weight is also discussed.  

## 2012-08-03 ENCOUNTER — Encounter (INDEPENDENT_AMBULATORY_CARE_PROVIDER_SITE_OTHER): Payer: Self-pay | Admitting: *Deleted

## 2012-08-03 NOTE — Addendum Note (Signed)
Addended by: Kandis Fantasia B on: 08/03/2012 05:08 PM   Modules accepted: Orders

## 2012-08-30 ENCOUNTER — Other Ambulatory Visit: Payer: Self-pay | Admitting: Family Medicine

## 2012-08-31 LAB — HEMOGLOBIN A1C
Hgb A1c MFr Bld: 5.2 % (ref <5.7)
Mean Plasma Glucose: 103 mg/dL (ref <117)

## 2012-08-31 LAB — CBC
HCT: 36 % — ABNORMAL LOW (ref 39.0–52.0)
Hemoglobin: 11.2 g/dL — ABNORMAL LOW (ref 13.0–17.0)
MCH: 26.2 pg (ref 26.0–34.0)
MCHC: 31.1 g/dL (ref 30.0–36.0)
MCV: 84.3 fL (ref 78.0–100.0)
Platelets: 378 10*3/uL (ref 150–400)
RBC: 4.27 MIL/uL (ref 4.22–5.81)
RDW: 16.7 % — ABNORMAL HIGH (ref 11.5–15.5)
WBC: 7.1 10*3/uL (ref 4.0–10.5)

## 2012-08-31 LAB — FERRITIN: Ferritin: 810 ng/mL — ABNORMAL HIGH (ref 22–322)

## 2012-08-31 LAB — LIPID PANEL
Cholesterol: 178 mg/dL (ref 0–200)
HDL: 30 mg/dL — ABNORMAL LOW (ref >39)
LDL Cholesterol: 120 mg/dL — ABNORMAL HIGH (ref 0–99)
Total CHOL/HDL Ratio: 5.9 Ratio
Triglycerides: 141 mg/dL (ref <150)
VLDL: 28 mg/dL (ref 0–40)

## 2012-08-31 LAB — IRON: Iron: 52 ug/dL (ref 42–165)

## 2012-08-31 LAB — TSH: TSH: 1.255 u[IU]/mL (ref 0.350–4.500)

## 2012-08-31 LAB — PSA: PSA: 3.46 ng/mL (ref <=4.00)

## 2012-09-21 ENCOUNTER — Encounter: Payer: Self-pay | Admitting: Family Medicine

## 2012-09-21 ENCOUNTER — Ambulatory Visit (INDEPENDENT_AMBULATORY_CARE_PROVIDER_SITE_OTHER): Payer: BC Managed Care – PPO | Admitting: Family Medicine

## 2012-09-21 ENCOUNTER — Encounter (INDEPENDENT_AMBULATORY_CARE_PROVIDER_SITE_OTHER): Payer: Self-pay | Admitting: *Deleted

## 2012-09-21 VITALS — BP 110/70 | HR 90 | Resp 16 | Ht 66.0 in | Wt 219.0 lb

## 2012-09-21 DIAGNOSIS — R0789 Other chest pain: Secondary | ICD-10-CM

## 2012-09-21 DIAGNOSIS — D649 Anemia, unspecified: Secondary | ICD-10-CM

## 2012-09-21 DIAGNOSIS — J449 Chronic obstructive pulmonary disease, unspecified: Secondary | ICD-10-CM

## 2012-09-21 DIAGNOSIS — Z0000 Encounter for general adult medical examination without abnormal findings: Secondary | ICD-10-CM

## 2012-09-21 DIAGNOSIS — I10 Essential (primary) hypertension: Secondary | ICD-10-CM

## 2012-09-21 DIAGNOSIS — J4489 Other specified chronic obstructive pulmonary disease: Secondary | ICD-10-CM

## 2012-09-21 DIAGNOSIS — F17200 Nicotine dependence, unspecified, uncomplicated: Secondary | ICD-10-CM

## 2012-09-21 DIAGNOSIS — Z1211 Encounter for screening for malignant neoplasm of colon: Secondary | ICD-10-CM

## 2012-09-21 DIAGNOSIS — M109 Gout, unspecified: Secondary | ICD-10-CM

## 2012-09-21 DIAGNOSIS — Z1212 Encounter for screening for malignant neoplasm of rectum: Secondary | ICD-10-CM

## 2012-09-21 LAB — BASIC METABOLIC PANEL
BUN: 12 mg/dL (ref 6–23)
CO2: 30 mEq/L (ref 19–32)
Calcium: 9.9 mg/dL (ref 8.4–10.5)
Chloride: 100 mEq/L (ref 96–112)
Creat: 0.84 mg/dL (ref 0.50–1.35)
Glucose, Bld: 96 mg/dL (ref 70–99)
Potassium: 4.1 mEq/L (ref 3.5–5.3)
Sodium: 138 mEq/L (ref 135–145)

## 2012-09-21 LAB — POC HEMOCCULT BLD/STL (OFFICE/1-CARD/DIAGNOSTIC): Fecal Occult Blood, POC: NEGATIVE

## 2012-09-21 LAB — URIC ACID: Uric Acid, Serum: 7.1 mg/dL (ref 4.0–7.8)

## 2012-09-21 MED ORDER — BUPROPION HCL ER (SR) 150 MG PO TB12: 150.0000 mg | ORAL_TABLET | Freq: Two times a day (BID) | ORAL | Status: DC

## 2012-09-21 MED ORDER — TRIAMTERENE-HCTZ 37.5-25 MG PO TABS: 1.0000 | ORAL_TABLET | Freq: Every day | ORAL | Status: DC

## 2012-09-21 MED ORDER — BUDESONIDE-FORMOTEROL FUMARATE 80-4.5 MCG/ACT IN AERO: 2.0000 | INHALATION_SPRAY | Freq: Two times a day (BID) | RESPIRATORY_TRACT | Status: DC

## 2012-09-21 NOTE — Patient Instructions (Addendum)
F/u in 2. 5 month  STOP llisinopril/hCTZ for blood pressure, may be the cause of the daily scratchy throat, instead maxzide 25mg  daily, continue amlodipine 10mg  daily  You need to call GI for yor appt for colonoscopy, the doc may also elect to look down youe stomach since you are anemic  You have COPD causing shortness of breath.  You need to use spiriva and symbicort every day, albuterol is just for rescue  STOPPING all nicotine products will certainly protect your  Lungs, so plan a quit date 2 weeks ideally after you start wellbutrin  Labs today are chem 7, uric acid, h pylori  You are referred for chest scan  Rectal exam and EKG in office today

## 2012-09-22 ENCOUNTER — Telehealth: Payer: Self-pay | Admitting: Family Medicine

## 2012-09-22 ENCOUNTER — Other Ambulatory Visit: Payer: Self-pay

## 2012-09-22 LAB — H. PYLORI ANTIBODY, IGG: H Pylori IgG: <0.4 {ISR}

## 2012-09-22 MED ORDER — AMLODIPINE BESYLATE 10 MG PO TABS: 10.0000 mg | ORAL_TABLET | Freq: Every day | ORAL | Status: DC

## 2012-09-22 NOTE — Telephone Encounter (Signed)
Patient is aware 

## 2012-09-26 DIAGNOSIS — R0789 Other chest pain: Secondary | ICD-10-CM | POA: Insufficient documentation

## 2012-09-26 DIAGNOSIS — Z0000 Encounter for general adult medical examination without abnormal findings: Secondary | ICD-10-CM | POA: Insufficient documentation

## 2012-09-26 NOTE — Assessment & Plan Note (Signed)
Controlled, but appears to have ACE allergy, change med and f/u BP DASH diet and commitment to daily physical activity for a minimum of 30 minutes discussed and encouraged, as a part of hypertension management. The importance of attaining a healthy weight is also discussed.

## 2012-09-26 NOTE — Assessment & Plan Note (Signed)
EKG in office negative for acute ischemia an pt is in sinus rythm

## 2012-09-26 NOTE — Assessment & Plan Note (Addendum)
Moderately severe. Pt to commit to daily inhaler use and discontinuing nicotine Chest CT scan ordered

## 2012-09-26 NOTE — Progress Notes (Signed)
  Subjective:    Patient ID: Todd Randall, male    DOB: Feb 16, 1956, 57 y.o.   MRN: [id number]  HPI The PT is here for annual exam and re-evaluation of chronic medical conditions, medication management and review of any available recent lab and radiology data.  Preventive health is updated, specifically  Cancer screening and Immunization.Still needs colonoscopy   ed.Questions or concerns regarding consultations or procedures which the PT has had in the interim are  addressed The PT denies any adverse reactions to current medications since the last visit.   C/o intermittent chest discomfort, not associated necessarily with activity, also sonme exertional fatigue. Still using electric cigarettes    Review of Systems See HPI Denies recent fever or chills. Denies sinus pressure, nasal congestion, ear pain or sore throat. C/o chronic tickle in throat with cough, no sputum Denies PND, orthopnea, palpitations and leg swelling Denies abdominal pain, nausea, vomiting,diarrhea or constipation.   Denies dysuria, frequency, hesitancy or incontinence. Denies joint pain, swelling and limitation in mobility. Denies headaches, seizures, numbness, or tingling. Denies depression, anxiety or insomnia. Denies skin break down or rash.        Objective:   Physical Exam  Pleasant obese male, alert and oriented x 3, in no cardio-pulmonary distress. Afebrile. HEENT No facial trauma or asymetry. Sinuses non tender. EOMI, PERTL, fundoscopic exam is negative for hemorhages or exudates. External ears normal, tympanic membranes clear. Oropharynx moist, no exudate, poor  dentition. Neck: supple, no adenopathy,JVD or thyromegaly.No bruits.  Chest: Clear to ascultation bilaterally.No crackles or wheezes.Decreased though adequate air entry Non tender to palpation  Breast: No asymetry,no masses. No nipple discharge or inversion. No axillary or supraclavicular adenopathy  Cardiovascular system; Heart  sounds normal,  S1 and  S2 ,no S3.  No murmur, or thrill. Apical beat not displaced Peripheral pulses normal. EKG: NSR no ischemia Abdomen: Soft, non tender, no organomegaly or masses. No bruits. Bowel sounds normal. No guarding, tenderness or rebound.  Rectal:  No mass. guaiac negative stool. Prostate smooth and firm   Musculoskeletal exam: Full ROM of spine, hips , shoulders and knees. No deformity ,swelling or crepitus noted. No muscle wasting or atrophy.   Neurologic: Cranial nerves 2 to 12 intact. Power, tone ,sensation and reflexes normal throughout. No disturbance in gait. No tremor.  Skin: Intact, no ulceration, erythema , scaling or rash noted. Pigmentation normal throughout  Psych; Normal mood and affect. Judgement and concentration normal        Assessment & Plan:

## 2012-09-26 NOTE — Assessment & Plan Note (Signed)
Physical exam as documented. Pt needs to work on weight loss and regular exercise. Also needs to commit t stopping all nicotine products. He will call to schedule his colonoscopy

## 2012-09-27 ENCOUNTER — Ambulatory Visit (HOSPITAL_COMMUNITY)
Admission: RE | Admit: 2012-09-27 | Discharge: 2012-09-27 | Disposition: A | Discharge status: Home / Self Care | Payer: BC Managed Care – PPO | Source: Ambulatory Visit | Attending: Family Medicine | Admitting: Family Medicine

## 2012-09-27 DIAGNOSIS — F17200 Nicotine dependence, unspecified, uncomplicated: Secondary | ICD-10-CM

## 2012-09-27 DIAGNOSIS — Z87891 Personal history of nicotine dependence: Secondary | ICD-10-CM | POA: Insufficient documentation

## 2012-09-27 DIAGNOSIS — R059 Cough, unspecified: Secondary | ICD-10-CM | POA: Insufficient documentation

## 2012-09-27 DIAGNOSIS — J4489 Other specified chronic obstructive pulmonary disease: Secondary | ICD-10-CM | POA: Insufficient documentation

## 2012-09-27 DIAGNOSIS — I319 Disease of pericardium, unspecified: Secondary | ICD-10-CM | POA: Insufficient documentation

## 2012-09-27 DIAGNOSIS — R05 Cough: Secondary | ICD-10-CM | POA: Insufficient documentation

## 2012-09-27 DIAGNOSIS — J449 Chronic obstructive pulmonary disease, unspecified: Secondary | ICD-10-CM | POA: Insufficient documentation

## 2012-09-27 DIAGNOSIS — I10 Essential (primary) hypertension: Secondary | ICD-10-CM | POA: Insufficient documentation

## 2012-10-21 ENCOUNTER — Ambulatory Visit (INDEPENDENT_AMBULATORY_CARE_PROVIDER_SITE_OTHER): Payer: BC Managed Care – PPO | Admitting: Internal Medicine

## 2012-10-21 ENCOUNTER — Telehealth (INDEPENDENT_AMBULATORY_CARE_PROVIDER_SITE_OTHER): Payer: Self-pay | Admitting: *Deleted

## 2012-10-21 ENCOUNTER — Encounter (INDEPENDENT_AMBULATORY_CARE_PROVIDER_SITE_OTHER): Payer: Self-pay | Admitting: Internal Medicine

## 2012-10-21 ENCOUNTER — Other Ambulatory Visit (INDEPENDENT_AMBULATORY_CARE_PROVIDER_SITE_OTHER): Payer: Self-pay | Admitting: *Deleted

## 2012-10-21 VITALS — BP 112/66 | HR 80 | Temp 98.4°F | Ht 66.0 in | Wt 218.9 lb

## 2012-10-21 DIAGNOSIS — Z800 Family history of malignant neoplasm of digestive organs: Secondary | ICD-10-CM

## 2012-10-21 DIAGNOSIS — Z860100 Personal history of colon polyps, unspecified: Secondary | ICD-10-CM | POA: Insufficient documentation

## 2012-10-21 DIAGNOSIS — Z1211 Encounter for screening for malignant neoplasm of colon: Secondary | ICD-10-CM

## 2012-10-21 MED ORDER — PEG-KCL-NACL-NASULF-NA ASC-C 100 G PO SOLR: 1.0000 | Freq: Once | ORAL | Status: DC

## 2012-10-21 NOTE — Progress Notes (Signed)
Subjective:     Patient ID: Todd Randall, male   DOB: 1956-02-01, 57 y.o.   MRN: [id number]  HPI Referred to our office by Dr. Lodema Hong screening colonoscopy.  Appetite is good. No weight loss. No dysphagia. No abdominal pain. He usually has a BM daily. No bright red rectal bleeding or melena. No change in his stools.   Colonoscopy 2007 Dr. Darrick Penna average risk:  FINDINGS:  1. A 4-mm sessile ascending colon polyp, removed via cold forceps.  2. A 6-mm sessile proximal transverse colon polyp, removed via snare  cautery (25 W).  3. Otherwise, no diverticula, masses, inflammatory changes, or vascular  ectasia seen.  4. Normal retroflexed view of the rectum. Biopsy: FINAL DIAGNOSIS MICROSCOPIC EXAMINATION AND DIAGNOSIS  RIGHT COLON, BIOPSY: - FRAGMENTS OF TUBULAR ADENOMA. - NO HIGH GRADE DYSPLASIA OR MALIGNANCY IDENTIFIED.       Mother had colon cancer and died in her 62s.  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 No children.   CBC    Component Value Date/Time   WBC 7.1 08/30/2012 1340   RBC 4.27 08/30/2012 1340   HGB 11.2* 08/30/2012 1340   HCT 36.0* 08/30/2012 1340   PLT 378 08/30/2012 1340   MCV 84.3 08/30/2012 1340   MCH 26.2 08/30/2012 1340   MCHC 31.1 08/30/2012 1340   RDW 16.7* 08/30/2012 1340   LYMPHSABS 1.1 08/28/2008 0916   MONOABS 0.4 08/28/2008 0916   EOSABS 0.1 08/28/2008 0916   BASOSABS 0.0 08/28/2008 0916   09/22/2012 Fecal occult blood negative. H. Pylori negative, Iron 52, Ferritin 810. BMET    Component Value Date/Time   NA 138 09/21/2012 1102   K 4.1 09/21/2012 1102   CL 100 09/21/2012 1102   CO2 30 09/21/2012 1102   GLUCOSE 96 09/21/2012 1102   BUN 12 09/21/2012 1102   CREATININE 0.84 09/21/2012 1102   CREATININE 1.49 08/28/2008 0916   CALCIUM 9.9 09/21/2012 1102   GFRNONAA 50* 08/28/2008 0916   GFRAA  Value: >60        The eGFR has been calculated using the MDRD equation. This calculation has not been validated in all clinical situations. eGFR's persistently <60 mL/min  signify possible Chronic Kidney Disease. 08/28/2008 0916       Review of Systems see hpi Current Outpatient Prescriptions  Medication Sig Dispense Refill  . amLODipine (NORVASC) 10 MG tablet Take 1 tablet (10 mg total) by mouth daily.  30 tablet  3  . aspirin 81 MG tablet Take 81 mg by mouth daily.      . budesonide-formoterol (SYMBICORT) 80-4.5 MCG/ACT inhaler Inhale 2 puffs into the lungs 2 (two) times daily.  1 Inhaler  12  . buPROPion (WELLBUTRIN SR) 150 MG 12 hr tablet Take 1 tablet (150 mg total) by mouth 2 (two) times daily.  60 tablet  5  . cholecalciferol (VITAMIN D) 400 UNITS TABS tablet Take 1,000 Units by mouth.      . fish oil-omega-3 fatty acids 1000 MG capsule Take 2 g by mouth 2 (two) times daily before a meal.      . magnesium 30 MG tablet Take 30 mg by mouth 2 (two) times daily.      . Multiple Vitamin (MULTIVITAMIN) tablet Take 1 tablet by mouth daily.      Marland Kitchen tiotropium (SPIRIVA) 18 MCG inhalation capsule Place 18 mcg into inhaler and inhale daily.      Marland Kitchen triamterene-hydrochlorothiazide (MAXZIDE-25) 37.5-25 MG per tablet Take 1 tablet by mouth  daily.  30 tablet  11  . zinc gluconate 50 MG tablet Take 50 mg by mouth daily.      Marland Kitchen albuterol (PROVENTIL HFA;VENTOLIN HFA) 108 (90 BASE) MCG/ACT inhaler Inhale 2 puffs into the lungs every 6 (six) hours as needed for wheezing.       No current facility-administered medications for this visit.   Past Medical History  Diagnosis Date  . Hypertension   . Hyperlipidemia   . COPD (chronic obstructive pulmonary disease)    Past Surgical History  Procedure Laterality Date  . 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   Allergies  Allergen Reactions  . Lisinopril Cough        Objective:   Physical Exam  Filed Vitals:   10/21/12 1443  BP: 112/66  Pulse: 80  Temp: 98.4 F (36.9 C)  Height: 5\' 6"  (1.676 m)  Weight: 218 lb 14.4 oz (99.292 kg)   Alert and oriented. Skin warm and dry. Oral  mucosa is moist.   . Sclera anicteric, conjunctivae is pink. Thyroid not enlarged. No cervical lymphadenopathy. Lungs clear. Heart regular rate and rhythm.  Abdomen is soft. Bowel sounds are positive. No hepatomegaly. No abdominal masses felt. No tenderness.  No edema to lower extremities.       Assessment:    Normal Exam. Family hx of colon cancer. Hx of colon polyps.    Plan:    Colonoscopy with Dr. Karilyn Cota

## 2012-10-21 NOTE — Patient Instructions (Addendum)
Colonoscopy with Dr. Rehman. The risks and benefits such as perforation, bleeding, and infection were reviewed with the patient and is agreeable. 

## 2012-10-21 NOTE — Telephone Encounter (Signed)
Patient needs movi prep 

## 2012-10-22 ENCOUNTER — Encounter (HOSPITAL_COMMUNITY): Payer: Self-pay | Admitting: Pharmacy Technician

## 2012-10-28 ENCOUNTER — Encounter: Payer: Self-pay | Admitting: Family Medicine

## 2012-10-28 ENCOUNTER — Ambulatory Visit (INDEPENDENT_AMBULATORY_CARE_PROVIDER_SITE_OTHER): Payer: BC Managed Care – PPO | Admitting: Family Medicine

## 2012-10-28 VITALS — BP 120/78 | HR 74 | Resp 16 | Ht 66.0 in | Wt 221.0 lb

## 2012-10-28 DIAGNOSIS — I10 Essential (primary) hypertension: Secondary | ICD-10-CM

## 2012-10-28 DIAGNOSIS — M109 Gout, unspecified: Secondary | ICD-10-CM

## 2012-10-28 MED ORDER — INDOMETHACIN 50 MG PO CAPS: ORAL_CAPSULE | ORAL | Status: DC

## 2012-10-28 MED ORDER — PREDNISONE 5 MG PO TABS: 5.0000 mg | ORAL_TABLET | Freq: Two times a day (BID) | ORAL | Status: AC

## 2012-10-28 MED ORDER — CLONIDINE HCL 0.2 MG PO TABS: ORAL_TABLET | ORAL | Status: DC

## 2012-10-28 NOTE — Patient Instructions (Signed)
F/u as before  You will get toradol 60mg  and depo medrol 80mg  IM in the office today for right ankle pain and swelling  Please take medications sent in for ankle also, indomethacin and prednisone as prescribed.  STOP maxzide/triamterene for blood pressure, this raises your uric acid level, increasing gout risk  New for blood pressure is clonidine 0.2mg  one at bedtime  Uric acid level before next visit please,( 2 to 4 days )

## 2012-10-28 NOTE — Progress Notes (Signed)
  Subjective:    Patient ID: Todd Randall, male    DOB: Feb 21, 1956, 57 y.o.   MRN: [id number]  HPI 1 week h/o disabling right ankle pain, worse in the past 4 days, limiting walking. Has h/o gout . No recent trigger for current pain as far as diet is concerned, no trauma. Ankles swollen and has been warm. Did have a few remaining indomethacin tabs which he used this week Otherwise has been doing well, however, due to recurrent gout will need to change BP med    Review of Systems See HPI Denies recent fever or chills. Denies sinus pressure, nasal congestion, ear pain or sore throat. Denies chest congestion, productive cough or wheezing. Denies chest pains, palpitations and leg swelling Denies abdominal pain, nausea, vomiting,diarrhea or constipation.   Denies dysuria, frequency, hesitancy or incontinence. Denies headaches, seizures, numbness, or tingling. Denies depression, anxiety or insomnia. Denies skin break down or rash.        Objective:   Physical Exam  Patient alert and oriented and in no cardiopulmonary distress.Pt in pain, walking with a limp due to inability to fully weight bear on affected foot  HEENT: No facial asymmetry, EOMI, no sinus tenderness,  oropharynx pink and moist.  Neck supple no adenopathy.  Chest: Clear to auscultation bilaterally.Decreased though adequate air entry  CVS: S1, S2 no murmurs, no S3.  ABD: Soft non tender. Bowel sounds normal.  Ext: No edema  MS: Adequate ROM spine, shoulders, hips and knees.Decreased ROM right ankle with swelling and tenderness, no erythema or warmth  Skin: Intact, no ulcerations or rash noted.  Psych: Good eye contact, normal affect. Memory intact not anxious or depressed appearing.  CNS: CN 2-12 intact, power, tone and sensation normal throughout.       Assessment & Plan:

## 2012-10-29 DIAGNOSIS — M109 Gout, unspecified: Secondary | ICD-10-CM

## 2012-10-29 MED ORDER — METHYLPREDNISOLONE ACETATE 80 MG/ML IJ SUSP
80.0000 mg | Freq: Once | INTRAMUSCULAR | Status: AC
  Administered 2012-10-29: 80 mg via INTRAMUSCULAR

## 2012-10-29 MED ORDER — KETOROLAC TROMETHAMINE 60 MG/2ML IJ SOLN
60.0000 mg | Freq: Once | INTRAMUSCULAR | Status: AC
  Administered 2012-10-29: 60 mg via INTRAMUSCULAR

## 2012-10-31 NOTE — Assessment & Plan Note (Signed)
Acute flare right ankle, toradol and depo medrol in office followed by oral anti inflammatories. D/c maxzide trial of clonidine

## 2012-10-31 NOTE — Assessment & Plan Note (Signed)
Controlled,  But will change form maxzide to clonidine due to hyperuricemia associated with former F/u for re check in 6 weeks

## 2012-11-10 ENCOUNTER — Encounter (HOSPITAL_COMMUNITY)
Admission: RE | Disposition: A | Discharge status: Home / Self Care | Payer: Self-pay | Source: Ambulatory Visit | Attending: Internal Medicine

## 2012-11-10 ENCOUNTER — Ambulatory Visit (HOSPITAL_COMMUNITY)
Admission: RE | Admit: 2012-11-10 | Discharge: 2012-11-10 | Disposition: A | Discharge status: Home / Self Care | Payer: BC Managed Care – PPO | Source: Ambulatory Visit | Attending: Internal Medicine | Admitting: Internal Medicine

## 2012-11-10 ENCOUNTER — Encounter (HOSPITAL_COMMUNITY): Payer: Self-pay | Admitting: *Deleted

## 2012-11-10 DIAGNOSIS — E785 Hyperlipidemia, unspecified: Secondary | ICD-10-CM | POA: Insufficient documentation

## 2012-11-10 DIAGNOSIS — Z860100 Personal history of colon polyps, unspecified: Secondary | ICD-10-CM | POA: Insufficient documentation

## 2012-11-10 DIAGNOSIS — Z800 Family history of malignant neoplasm of digestive organs: Secondary | ICD-10-CM

## 2012-11-10 DIAGNOSIS — J4489 Other specified chronic obstructive pulmonary disease: Secondary | ICD-10-CM | POA: Insufficient documentation

## 2012-11-10 DIAGNOSIS — I10 Essential (primary) hypertension: Secondary | ICD-10-CM | POA: Insufficient documentation

## 2012-11-10 DIAGNOSIS — K5730 Diverticulosis of large intestine without perforation or abscess without bleeding: Secondary | ICD-10-CM | POA: Insufficient documentation

## 2012-11-10 DIAGNOSIS — J449 Chronic obstructive pulmonary disease, unspecified: Secondary | ICD-10-CM | POA: Insufficient documentation

## 2012-11-10 DIAGNOSIS — K644 Residual hemorrhoidal skin tags: Secondary | ICD-10-CM

## 2012-11-10 HISTORY — PX: COLONOSCOPY: SHX5424

## 2012-11-10 SURGERY — COLONOSCOPY
Anesthesia: Moderate Sedation

## 2012-11-10 MED ORDER — MIDAZOLAM HCL 5 MG/5ML IJ SOLN
INTRAMUSCULAR | Status: AC
  Filled 2012-11-10: qty 10

## 2012-11-10 MED ORDER — MEPERIDINE HCL 50 MG/ML IJ SOLN
INTRAMUSCULAR | Status: AC
  Filled 2012-11-10: qty 1

## 2012-11-10 MED ORDER — SODIUM CHLORIDE 0.9 % IV SOLN
INTRAVENOUS | Status: DC
  Administered 2012-11-10: 13:00:00 via INTRAVENOUS

## 2012-11-10 MED ORDER — MIDAZOLAM HCL 5 MG/5ML IJ SOLN
INTRAMUSCULAR | Status: DC | PRN
  Administered 2012-11-10: 3 mg via INTRAVENOUS
  Administered 2012-11-10: 2 mg via INTRAVENOUS

## 2012-11-10 MED ORDER — STERILE WATER FOR IRRIGATION IR SOLN
Status: DC | PRN
  Administered 2012-11-10: 14:00:00

## 2012-11-10 MED ORDER — MEPERIDINE HCL 50 MG/ML IJ SOLN
INTRAMUSCULAR | Status: DC | PRN
  Administered 2012-11-10 (×2): 25 mg via INTRAVENOUS

## 2012-11-10 NOTE — Op Note (Signed)
COLONOSCOPY PROCEDURE REPORT  PATIENT:  Todd Randall  MR#:  [id number] Birthdate:  [birth year], 57 y.o., male Endoscopist:  Dr. Malissa Hippo, MD Referred By:  Dr. Syliva Overman, MD  Procedure Date: 11/10/2012  Procedure:   Colonoscopy  Indications:  Patient is 57 year old African male with history of colonic adenoma and family history of colon carcinoma in mother. Patient's last colonoscopy was in August 2007.  Informed Consent:  The procedure and risks were reviewed with the patient and informed consent was obtained.  Medications:  Demerol 50mg  IV Versed 5 mg IV  Description of procedure:  After a digital rectal exam was performed, that colonoscope was advanced from the anus through the rectum and colon to the area of the cecum, ileocecal valve and appendiceal orifice. The cecum was deeply intubated. These structures were well-seen and photographed for the record. From the level of the cecum and ileocecal valve, the scope was slowly and cautiously withdrawn. The mucosal surfaces were carefully surveyed utilizing scope tip to flexion to facilitate fold flattening as needed. The scope was pulled down into the rectum where a thorough exam including retroflexion was performed.  Findings:   Prep excellent. Two small diverticula ascending and sigmoid colon. No evidence of colonic polyps or other abnormalities. Normal rectal mucosa. Small hemorrhoids below the dentate line.    Therapeutic/Diagnostic Maneuvers Performed:  None  Complications:  None  Cecal Withdrawal Time:  10 minutes  Impression:  Examination performed to cecum. Few small diverticula descending and sigmoid colon. Small external hemorrhoids. No evidence of colon polyps.  Recommendations:  Standard instructions given. Next colonoscopy in 5 years.  REHMAN,NAJEEB U  11/10/2012 2:30 PM  CC: Dr. Syliva Overman, MD & Dr. Bonnetta Barry ref. provider found

## 2012-11-10 NOTE — H&P (Signed)
Todd Randall is an 57 y.o. male.   Chief Complaint: Patient is here for colonoscopy. HPI: Patient is 57 year old African male who has history of colonic adenoma and family history of colon carcinoma. His last colonoscopy was in August 2007 with removal of 2 small polyps and one was tubular adenoma. His mother was diagnosed with colon carcinoma in her late 27s and died within 2 years of metastatic disease. Patient denies abdominal pain change in his bowel habits or rectal bleeding.  Past Medical History  Diagnosis Date  . Hypertension   . Hyperlipidemia   . COPD (chronic obstructive pulmonary disease)     Past Surgical History  Procedure Laterality Date  . 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    Family History  Problem Relation Age of Onset  . Colon cancer Mother    Social History:  reports that he has quit smoking. He does not have any smokeless tobacco history on file. He reports that he does not drink alcohol or use illicit drugs.  Allergies:  Allergies  Allergen Reactions  . Lisinopril Cough    Medications Prior to Admission  Medication Sig Dispense Refill  . albuterol (PROVENTIL HFA;VENTOLIN HFA) 108 (90 BASE) MCG/ACT inhaler Inhale 2 puffs into the lungs every 6 (six) hours as needed for wheezing.      Marland Kitchen amLODipine (NORVASC) 10 MG tablet Take 1 tablet (10 mg total) by mouth daily.  30 tablet  3  . aspirin 81 MG tablet Take 81 mg by mouth daily.      . budesonide-formoterol (SYMBICORT) 80-4.5 MCG/ACT inhaler Inhale 2 puffs into the lungs 2 (two) times daily.  1 Inhaler  12  . buPROPion (WELLBUTRIN SR) 150 MG 12 hr tablet Take 1 tablet (150 mg total) by mouth 2 (two) times daily.  60 tablet  5  . cholecalciferol (VITAMIN D) 400 UNITS TABS tablet Take 1,000 Units by mouth.      . cloNIDine (CATAPRES) 0.2 MG tablet One tablet at bedtime for blood pressure  30 tablet  1  . fish oil-omega-3 fatty acids 1000 MG capsule Take 2 g by mouth 2 (two) times  daily before a meal.      . magnesium 30 MG tablet Take 30 mg by mouth 2 (two) times daily.      . Multiple Vitamin (MULTIVITAMIN) tablet Take 1 tablet by mouth daily.      . peg 3350 powder (MOVIPREP) 100 G SOLR Take 1 kit (200 g total) by mouth once.  1 kit  0  . tiotropium (SPIRIVA) 18 MCG inhalation capsule Place 18 mcg into inhaler and inhale daily.      Marland Kitchen zinc gluconate 50 MG tablet Take 50 mg by mouth daily.        No results found for this or any previous visit (from the past 48 hour(s)). No results found.  ROS  Blood pressure 109/70, pulse 75, temperature 98.5 F (36.9 C), temperature source Oral, resp. rate 18, height 5\' 6"  (1.676 m), weight 221 lb (100.245 kg), SpO2 98.00%. Physical Exam  Constitutional: He appears well-developed and well-nourished.  HENT:  Mouth/Throat: Oropharynx is clear and moist.  Eyes: Conjunctivae are normal. No scleral icterus.  Neck: No thyromegaly present.  Cardiovascular: Normal rate, regular rhythm and normal heart sounds.   No murmur heard. Respiratory: Effort normal and breath sounds normal.  GI: Soft. He exhibits no mass. There is no tenderness.  Musculoskeletal: He exhibits no  edema.  Lymphadenopathy:    He has no cervical adenopathy.  Neurological: He is alert.  Skin: Skin is warm and dry.     Assessment/Plan Personal history of colonic adenoma. Family history of colon carcinoma first degree relative( mother in her 22s). Surveillance colonoscopy.  Todd Randall U 11/10/2012, 2:04 PM

## 2012-11-11 ENCOUNTER — Encounter (HOSPITAL_COMMUNITY): Payer: Self-pay | Admitting: Internal Medicine

## 2012-12-01 ENCOUNTER — Other Ambulatory Visit: Payer: Self-pay | Admitting: Family Medicine

## 2012-12-01 LAB — URIC ACID: Uric Acid, Serum: 7 mg/dL (ref 4.0–7.8)

## 2012-12-06 ENCOUNTER — Ambulatory Visit (INDEPENDENT_AMBULATORY_CARE_PROVIDER_SITE_OTHER): Payer: BC Managed Care – PPO | Admitting: Family Medicine

## 2012-12-06 ENCOUNTER — Encounter: Payer: Self-pay | Admitting: Family Medicine

## 2012-12-06 VITALS — BP 120/82 | HR 78 | Resp 16 | Wt 222.4 lb

## 2012-12-06 DIAGNOSIS — J45909 Unspecified asthma, uncomplicated: Secondary | ICD-10-CM

## 2012-12-06 DIAGNOSIS — J209 Acute bronchitis, unspecified: Secondary | ICD-10-CM

## 2012-12-06 DIAGNOSIS — F17200 Nicotine dependence, unspecified, uncomplicated: Secondary | ICD-10-CM

## 2012-12-06 DIAGNOSIS — M109 Gout, unspecified: Secondary | ICD-10-CM

## 2012-12-06 DIAGNOSIS — I10 Essential (primary) hypertension: Secondary | ICD-10-CM

## 2012-12-06 DIAGNOSIS — E669 Obesity, unspecified: Secondary | ICD-10-CM

## 2012-12-06 MED ORDER — FLUTICASONE-SALMETEROL 250-50 MCG/DOSE IN AEPB: 1.0000 | INHALATION_SPRAY | Freq: Two times a day (BID) | RESPIRATORY_TRACT | Status: DC

## 2012-12-06 MED ORDER — AZITHROMYCIN 250 MG PO TABS: ORAL_TABLET | ORAL | Status: AC

## 2012-12-06 MED ORDER — CLONIDINE HCL 0.1 MG PO TABS: ORAL_TABLET | ORAL | Status: DC

## 2012-12-06 MED ORDER — BENZONATATE 100 MG PO CAPS: 100.0000 mg | ORAL_CAPSULE | Freq: Four times a day (QID) | ORAL | Status: DC | PRN

## 2012-12-06 NOTE — Patient Instructions (Addendum)
F/u in 4.5 month, cal;l if you need me before  You are treated for acute bronchitis today,   Appt for flu vaccine to be made in office for 12/22/2012  Reduce clonidine 0.2mg  to HALF tablet at bedtime due to  Side effects  Work on weight loss and increased intake of natural unprocessed foods like fresh/frozen fruit and vegetable , walk for total of 20 to 30 minutes daily, this will elp Blood pressure  Call back if cost of advair is too high also, you can apply either through the health de[pt or to the manufacturer for patient assistance  Please coninue to work on stopping e cigarette use , anything inhaled is damaging to your lungs  Gout Gout is caused by a buildup of uric acid crystals in the joints. The crystals make your joints sore. This is like having sand in your joints. Repeat attacks are common. Gout can be treated. HOME CARE   Do not take aspirin for pain.  Only take medicine as told by your doctor.  You may use cold treatments (ice) on painful joints.  Put ice in a plastic bag.  Place a towel between your skin and the bag.  Leave the ice on for 15-20 minutes at a time, 3-4 times a day.  Rest in bed as much as possible. When in bed, keep the sheets and blankets off your sore joints.  Keep the sore joints raised (elevated).  Use crutches if your legs or ankles hurt.  Drink enough water and fluids to keep your pee (urine) clear or pale yellow. This helps your body get rid of uric acid. Do not drink alcohol.  Follow diet instructions as told by your doctor.  Keep your body at a healthy weight. GET HELP RIGHT AWAY IF:   You have a temperature by mouth above 102 F (38.9 C), not controlled by medicine.  You have watery poop (diarrhea).  You are throwing up (vomiting).  You do not feel better in 1 day, or you are getting worse.  Your joint hurts more.  You have the chills. MAKE SURE YOU:   Understand these instructions.  Will watch your condition.  Will  get help right away if you are not doing well or get worse. Document Released: 12/11/2007 Document Revised: 05/26/2011 Document Reviewed: 06/11/2009 [facility] Patient Information 2014 [place], [place].

## 2012-12-06 NOTE — Progress Notes (Signed)
  Subjective:    Patient ID: Todd Randall, male    DOB: 1955-04-13, 57 y.o.   MRN: [id number]  HPI The PT is here for follow up and re-evaluation of chronic medical conditions, medication management and review of any available recent lab and radiology data.  Preventive health is updated, specifically  Cancer screening and Immunization.   Questions or concerns regarding consultations or procedures which the PT has had in the interim are  addressed. The PT c/o excessive sedation and dry mouth wih clonidine, wants to change this. Unable to afford symbicort though it helped his breathing will check on cost of advair and get back to [place] 1 week h/o increased cough , sputum production and chest congestion with wheeze and chills Denies sinus pressure , post nasal drainage and sore throat    Review of Systems See HPI  Denies chest pains, palpitations and leg swelling Denies abdominal pain, nausea, vomiting,diarrhea or constipation.   Denies dysuria, frequency, hesitancy or incontinence. Denies joint pain, swelling and limitation in mobility.Infrequent gout flares, wants to d/c allopurinol Denies headaches, seizures, numbness, or tingling. Denies depression, anxiety or insomnia. Denies skin break down or rash.        Objective:   Physical Exam  Patient alert and oriented and in no cardiopulmonary distress.  HEENT: No facial asymmetry, EOMI, no sinus tenderness,  oropharynx pink and moist.  Neck supple no adenopathy.  Chest: decreased air entry, scattered crackles and wheezes CVS: S1, S2 no murmurs, no S3.  ABD: Soft non tender. Bowel sounds normal.  Ext: No edema  MS: Adequate ROM spine, shoulders, hips and knees.  Skin: Intact, no ulcerations or rash noted.  Psych: Good eye contact, normal affect. Memory intact not anxious or depressed appearing.  CNS: CN 2-12 intact, power, tone and sensation normal throughout.       Assessment & Plan:

## 2012-12-07 LAB — AST: AST: 15 U/L (ref 0–37)

## 2012-12-07 LAB — ALT: ALT: 12 U/L (ref 0–53)

## 2012-12-12 NOTE — Assessment & Plan Note (Signed)
Uncjhnged Patient re-educated about  the importance of commitment to a  minimum of 150 minutes of exercise per week. The importance of healthy food choices with portion control discussed. Encouraged to start a food diary, count calories and to consider  joining a support group. Sample diet sheets offered. Goals set by the patient for the next several months.

## 2012-12-12 NOTE — Assessment & Plan Note (Signed)
Very infrequent flares, on avg one every 6 months, uric acid level at 7.0, he wants to d/c allopurinol and try dietary control

## 2012-12-12 NOTE — Assessment & Plan Note (Signed)
C/o xs sedation and dry mouth with clonidine Dose reduced by half DASH diet and commitment to daily physical activity for a minimum of 30 minutes discussed and encouraged, as a part of hypertension management. The importance of attaining a healthy weight is also discussed.

## 2012-12-12 NOTE — Assessment & Plan Note (Signed)
Antibiotic and decongestant prescribed 

## 2012-12-12 NOTE — Assessment & Plan Note (Signed)
Increased wheeze with infection. Reports inability to purchase symbicort due to cost, trial of advair he is to call back if also too expensive, may ned to try to get from [employer]

## 2012-12-12 NOTE — Assessment & Plan Note (Signed)
Improved, relying on e cigarette only now, needs to quit also

## 2012-12-16 ENCOUNTER — Ambulatory Visit (INDEPENDENT_AMBULATORY_CARE_PROVIDER_SITE_OTHER): Payer: BC Managed Care – PPO | Admitting: Family Medicine

## 2012-12-16 ENCOUNTER — Encounter: Payer: Self-pay | Admitting: Family Medicine

## 2012-12-16 VITALS — BP 126/82 | HR 100 | Resp 18 | Ht 66.0 in | Wt 221.1 lb

## 2012-12-16 DIAGNOSIS — I10 Essential (primary) hypertension: Secondary | ICD-10-CM

## 2012-12-16 DIAGNOSIS — M109 Gout, unspecified: Secondary | ICD-10-CM

## 2012-12-16 MED ORDER — KETOROLAC TROMETHAMINE 60 MG/2ML IM SOLN
60.0000 mg | Freq: Once | INTRAMUSCULAR | Status: AC
  Administered 2012-12-16: 60 mg via INTRAMUSCULAR

## 2012-12-16 MED ORDER — METHYLPREDNISOLONE ACETATE 80 MG/ML IJ SUSP
80.0000 mg | Freq: Once | INTRAMUSCULAR | Status: AC
  Administered 2012-12-16: 80 mg via INTRAMUSCULAR

## 2012-12-16 MED ORDER — INDOMETHACIN 50 MG PO CAPS: ORAL_CAPSULE | ORAL | Status: DC

## 2012-12-16 MED ORDER — PREDNISONE 5 MG PO TABS: 5.0000 mg | ORAL_TABLET | Freq: Two times a day (BID) | ORAL | Status: AC

## 2012-12-16 MED ORDER — ALLOPURINOL 300 MG PO TABS: 300.0000 mg | ORAL_TABLET | Freq: Every day | ORAL | Status: DC

## 2012-12-16 NOTE — Assessment & Plan Note (Addendum)
Acute right wrist pain and swelling x 2 days, short sharp anti inflammatory course then uric acid lowering med

## 2012-12-16 NOTE — Patient Instructions (Addendum)
F/u as before, in January  Call if you need me before  You have acute gout in your right wrist. Toradol and depo medrol given in the office, take entire course of allopurinol and prednisone as directed, start today.  Start allopurinol one daily on October 16, to lower your uric acid level and reduce risk of repeat attack  Chem 7 and uric acid level, non fasting in January before visit

## 2012-12-18 NOTE — Progress Notes (Signed)
  Subjective:    Patient ID: Todd Randall, male    DOB: 1956-01-27, 57 y.o.   MRN: [id number]  HPI 2 day h/o acute right wrist pain and swelling, recently opted to d/c allopurinol as thought his gout attacks were few and far between Tolerating BP med well, no cough, no excessive sedation   Review of Systems See HPI Denies recent fever or chills. Denies sinus pressure, nasal congestion, ear pain or sore throat. Denies chest congestion, productive cough or wheezing. Denies chest pains, palpitations and leg swelling  Denies skin break down or rash.        Objective:   Physical Exam  Patient alert and oriented and in no cardiopulmonary distress.Pt in pain  HEENT: No facial asymmetry, EOMI, no sinus tenderness,  oropharynx pink and moist.  Neck supple no adenopathy.  Chest: Clear to auscultation bilaterally.  CVS: S1, S2 no murmurs, no S3.  ABD: Soft non tender.   Ext: No edema  MS: right wrist warm, swollen and tender with reduced ROM  Skin: Intact, no ulcerations or rash noted.         Assessment & Plan:

## 2012-12-18 NOTE — Assessment & Plan Note (Signed)
Controlled, no change in medication DASH diet and commitment to daily physical activity for a minimum of 30 minutes discussed and encouraged, as a part of hypertension management. The importance of attaining a healthy weight is also discussed.  

## 2012-12-22 ENCOUNTER — Ambulatory Visit (INDEPENDENT_AMBULATORY_CARE_PROVIDER_SITE_OTHER): Payer: BC Managed Care – PPO

## 2012-12-22 DIAGNOSIS — Z23 Encounter for immunization: Secondary | ICD-10-CM

## 2013-02-01 ENCOUNTER — Other Ambulatory Visit: Payer: Self-pay | Admitting: Family Medicine

## 2013-02-14 ENCOUNTER — Telehealth: Payer: Self-pay

## 2013-02-14 ENCOUNTER — Other Ambulatory Visit: Payer: Self-pay

## 2013-02-14 DIAGNOSIS — M109 Gout, unspecified: Secondary | ICD-10-CM

## 2013-02-14 MED ORDER — INDOMETHACIN 50 MG PO CAPS: ORAL_CAPSULE | ORAL | Status: DC

## 2013-02-14 NOTE — Telephone Encounter (Signed)
Since flare is so soon after last one in October, I hope he is taking the allopurinol entered to start 2 eeks after that flare IF NOT, explain he nEEDS the allopurinol to reduce frequency of flare, re send if he never filled, and he is tio datrtb in 2 weeks pls  Questions, pls ask

## 2013-02-14 NOTE — Telephone Encounter (Signed)
Med refilled.   Patient assured that he is taking allopurinol daily as prescribed.  His next ov is in Jan and he is to have uric acid before that visit.

## 2013-02-14 NOTE — Telephone Encounter (Signed)
pls refill the indomethacin x 1 and let him know, pls document where he is having the [problem and symptom duration  Needs a uric acid level checked if none in 4 months, I will send another msg on that

## 2013-04-05 LAB — BASIC METABOLIC PANEL
BUN: 19 mg/dL (ref 6–23)
CO2: 29 mEq/L (ref 19–32)
Calcium: 9.6 mg/dL (ref 8.4–10.5)
Chloride: 102 mEq/L (ref 96–112)
Creat: 0.73 mg/dL (ref 0.50–1.35)
Glucose, Bld: 87 mg/dL (ref 70–99)
Potassium: 4.2 mEq/L (ref 3.5–5.3)
Sodium: 140 mEq/L (ref 135–145)

## 2013-04-05 LAB — URIC ACID: Uric Acid, Serum: 5.4 mg/dL (ref 4.0–7.8)

## 2013-04-07 ENCOUNTER — Ambulatory Visit (INDEPENDENT_AMBULATORY_CARE_PROVIDER_SITE_OTHER): Payer: BC Managed Care – PPO | Admitting: Family Medicine

## 2013-04-07 ENCOUNTER — Encounter: Payer: Self-pay | Admitting: Family Medicine

## 2013-04-07 VITALS — BP 132/84 | HR 99 | Resp 16 | Ht 66.0 in | Wt 229.1 lb

## 2013-04-07 DIAGNOSIS — D649 Anemia, unspecified: Secondary | ICD-10-CM

## 2013-04-07 DIAGNOSIS — F17200 Nicotine dependence, unspecified, uncomplicated: Secondary | ICD-10-CM

## 2013-04-07 DIAGNOSIS — Z79899 Other long term (current) drug therapy: Secondary | ICD-10-CM

## 2013-04-07 DIAGNOSIS — J449 Chronic obstructive pulmonary disease, unspecified: Secondary | ICD-10-CM

## 2013-04-07 DIAGNOSIS — E669 Obesity, unspecified: Secondary | ICD-10-CM

## 2013-04-07 DIAGNOSIS — M109 Gout, unspecified: Secondary | ICD-10-CM

## 2013-04-07 DIAGNOSIS — E785 Hyperlipidemia, unspecified: Secondary | ICD-10-CM

## 2013-04-07 DIAGNOSIS — I10 Essential (primary) hypertension: Secondary | ICD-10-CM

## 2013-04-07 DIAGNOSIS — Z125 Encounter for screening for malignant neoplasm of prostate: Secondary | ICD-10-CM

## 2013-04-07 NOTE — Patient Instructions (Signed)
F/u in 4 month, call if you need me before  No more e cigarettes as of today, check the news  You are referred for lung function test more  To come on inhalers   Commit to 30 mins daily of exercise please and eat mainly vegetable and fruit , use water as your liquid  Chronic Obstructive Pulmonary Disease Chronic obstructive pulmonary disease (COPD) is a common lung problem. In COPD, the flow of air from the lungs is limited. The way your lungs work will probably never return to normal, but there are things you can do to improve you lungs and make yourself feel better. HOME CARE  Take all medicines as told by your doctor.  Only take over-the-counter or prescription medicines as told by your doctor.  Avoid medicines or cough syrups that dry up your airway (such as antihistamines) and do not allow you to get rid of thick spit. You do not need to avoid them if told differently by your doctor.  If you smoke, stop. Smoking makes the problem worse.  Avoid being around things that make your breathing worse (like smoke, chemicals, and fumes).  Use oxygen therapy and therapy to help improve your lungs (pulmonary rehabilitation) if told by your doctor. If you need home oxygen therapy, ask your doctor if you should buy a tool to measure your oxygen level (oximeter).  Avoid people who have a sickness you can catch (contagious).  Avoid going outside when it is very hot, cold, or humid.  Eat healthy foods. Eat smaller meals more often. Rest before meals.  Stay active, but remember to also rest.  Make sure to get all the shots (vaccines) your doctor recommends. Ask your doctor if you need a pneumonia shot.  Learn and use tips on how to relax.  Learn and use tips on how to control your breathing as told by your doctor. Try:  Breathing in (inhaling) through your nose for 1 second. Then, pucker your lips and breath out (exhale) through your lips for 2 seconds.  Putting one hand on your belly  (abdomen). Breathe in slowly through your nose for 1 second. Your hand on your belly should move out. Pucker your lips and breathe out slowly through your lips. Your hand on your belly should move in as you breathe out.  Learn and use controlled coughing to clear thick spit from your lungs. 1. Lean your head a little forward. 2. Breathe in deeply. 3. Try to hold your breath for 3 seconds. 4. Keep your mouth slightly open while coughing 2 times. 5. Spit any thick spit out into a tissue. 6. Rest and do the steps again 1 or 2 times as needed. GET HELP IF:  You cough up more thick spit than usual.  There is a change in the color or thickness of the spit.  It is harder to breathe than usual.  Your breathing is faster than usual. GET HELP RIGHT AWAY IF:   You have shortness of breath while resting.  You have shortness of breath that stops you from:  Being able to talk.  Doing normal activities.  You chest hurts for longer than 5 minutes.  Your skin color is more blue than usual.  Your pulse oximeter shows that you have low oxygen for longer than 5 minutes. MAKE SURE YOU:   Understand these instructions.  Will watch your condition.  Will get help right away if you are not doing well or get worse. Document Released: 08/20/2007 Document  Revised: 12/22/2012 Document Reviewed: 10/28/2012 [facility] Patient Information 2014 [place], [place].

## 2013-04-10 DIAGNOSIS — E785 Hyperlipidemia, unspecified: Secondary | ICD-10-CM | POA: Insufficient documentation

## 2013-04-10 NOTE — Assessment & Plan Note (Signed)
Advised to quit e cigarettes as now known to be without risk, he will work on this

## 2013-04-10 NOTE — Assessment & Plan Note (Signed)
Acute flare resolved and uric acid level npow controlled needs to continue daily allopurinol

## 2013-04-10 NOTE — Assessment & Plan Note (Signed)
Controlled, no change in medication DASH diet and commitment to daily physical activity for a minimum of 30 minutes discussed and encouraged, as a part of hypertension management. The importance of attaining a healthy weight is also discussed.  

## 2013-04-10 NOTE — Assessment & Plan Note (Signed)
Hyperlipidemia:Low fat diet discussed and encouraged.  Updated lab needed at/ before next visit.  

## 2013-04-10 NOTE — Assessment & Plan Note (Signed)
Deteriorating, need  PFT

## 2013-04-10 NOTE — Assessment & Plan Note (Signed)
Deteriorated. Patient re-educated about  the importance of commitment to a  minimum of 150 minutes of exercise per week. The importance of healthy food choices with portion control discussed. Encouraged to start a food diary, count calories and to consider  joining a support group. Sample diet sheets offered. Goals set by the patient for the next several months.    

## 2013-04-10 NOTE — Progress Notes (Signed)
   Subjective:    Patient ID: Todd Randall, male    DOB: February 17, 1956, 58 y.o.   MRN: [id number]  HPI  The PT is here for follow up and re-evaluation of chronic medical conditions, medication management and review of any available recent lab and radiology data.  Preventive health is updated, specifically  Cancer screening and Immunization.   Questions or concerns regarding consultations or procedures which the PT has had in the interim are  addressed. The PT denies any adverse reactions to current medications since the last visit.  There are no new concerns.  There are no specific complaints       Review of Systems See HPI Denies recent fever or chills. Denies sinus pressure, nasal congestion, ear pain or sore throat. Denies chest congestion, productive cough or wheezing.Notes that he "gets tired" gives out of breath easily when he tries to walk for exercise , but ha not been doing this consistently for months Denies chest pains, palpitations and leg swelling Denies abdominal pain, nausea, vomiting,diarrhea or constipation.   Denies dysuria, frequency, hesitancy or incontinence. Denies joint pain, swelling and limitation in mobility. Denies headaches, seizures, numbness, or tingling. Denies depression, anxiety or insomnia. Denies skin break down or rash.        Objective:   Physical Exam  Patient alert and oriented and in no cardiopulmonary distress.  HEENT: No facial asymmetry, EOMI, no sinus tenderness,  oropharynx pink and moist.  Neck supple no adenopathy.  Chest: Clear to auscultation bilaterally.Decreased air entry throughout  CVS: S1, S2 no murmurs, no S3.  ABD: Soft non tender. Bowel sounds normal.  Ext: No edema  MS: Adequate ROM spine, shoulders, hips and knees.  Skin: Intact, no ulcerations or rash noted.  Psych: Good eye contact, normal affect. Memory intact not anxious or depressed appearing.  CNS: CN 2-12 intact, power, tone and sensation normal  throughout.       Assessment & Plan:

## 2013-04-12 ENCOUNTER — Ambulatory Visit (HOSPITAL_COMMUNITY)
Admission: RE | Admit: 2013-04-12 | Discharge: 2013-04-12 | Disposition: A | Discharge status: Home / Self Care | Payer: BC Managed Care – PPO | Source: Ambulatory Visit | Attending: Family Medicine | Admitting: Family Medicine

## 2013-04-12 DIAGNOSIS — R0602 Shortness of breath: Secondary | ICD-10-CM | POA: Insufficient documentation

## 2013-04-12 DIAGNOSIS — J449 Chronic obstructive pulmonary disease, unspecified: Secondary | ICD-10-CM

## 2013-04-12 DIAGNOSIS — J4489 Other specified chronic obstructive pulmonary disease: Secondary | ICD-10-CM | POA: Insufficient documentation

## 2013-04-12 LAB — PULMONARY FUNCTION TEST
DL/VA % pred: 86 %
DL/VA: 3.8 ml/min/mmHg/L
DLCO cor % pred: 76 %
DLCO cor: 20.74 ml/min/mmHg
DLCO unc % pred: 76 %
DLCO unc: 20.74 ml/min/mmHg
FEF 25-75 Post: 0.76 L/sec
FEF 25-75 Pre: 1.74 L/sec
FEF2575-%Change-Post: -56 %
FEF2575-%Pred-Post: 27 %
FEF2575-%Pred-Pre: 63 %
FEV1-%Change-Post: -11 %
FEV1-%Pred-Post: 69 %
FEV1-%Pred-Pre: 78 %
FEV1-Post: 1.91 L
FEV1-Pre: 2.16 L
FEV1FVC-%Change-Post: -7 %
FEV1FVC-%Pred-Pre: 94 %
FEV6-%Change-Post: -6 %
FEV6-%Pred-Post: 79 %
FEV6-%Pred-Pre: 84 %
FEV6-Post: 2.7 L
FEV6-Pre: 2.88 L
FEV6FVC-%Change-Post: -2 %
FEV6FVC-%Pred-Post: 100 %
FEV6FVC-%Pred-Pre: 102 %
FVC-%Change-Post: -3 %
FVC-%Pred-Post: 79 %
FVC-%Pred-Pre: 82 %
FVC-Post: 2.78 L
FVC-Pre: 2.89 L
Post FEV1/FVC ratio: 69 %
Post FEV6/FVC ratio: 97 %
Pre FEV1/FVC ratio: 75 %
Pre FEV6/FVC Ratio: 99 %
RV % pred: 185 %
RV: 3.58 L
TLC % pred: 110 %
TLC: 6.82 L

## 2013-04-12 MED ORDER — ALBUTEROL SULFATE (2.5 MG/3ML) 0.083% IN NEBU
2.5000 mg | INHALATION_SOLUTION | Freq: Once | RESPIRATORY_TRACT | Status: AC
  Administered 2013-04-12: 2.5 mg via RESPIRATORY_TRACT

## 2013-04-13 NOTE — Procedures (Signed)
Todd Randall, Todd Randall                  ACCOUNT NO.:  [id number]  MEDICAL RECORD NO.:  [id number]  LOCATION:  RESP                          FACILITY:  [facility]  PHYSICIAN:  Shakara Tweedy L. Luan Pulling, M.D.DATE OF BIRTH:  April 27, 1955  DATE OF PROCEDURE: DATE OF DISCHARGE:  04/12/2013                           PULMONARY FUNCTION TEST   REASON FOR PULMONARY FUNCTION TESTING:  COPD.  1. Spirometry shows a mild ventilatory defect with evidence of airflow     obstruction most marked in the smaller airways. 2. Lung volumes show air trapping. 3. DLCO is mildly reduced. 4. Airway resistance is normal. 5. There is no significant bronchodilator improvement. 6. This study is consistent with clinical diagnosis of chronic airway     obstructions.     Gautam Langhorst L. Luan Pulling, M.D.     EL[facility]/MEDQ  D:  04/12/2013  T:  04/13/2013  Job:  [id number]

## 2013-05-02 ENCOUNTER — Other Ambulatory Visit: Payer: Self-pay | Admitting: Family Medicine

## 2013-05-03 ENCOUNTER — Encounter: Payer: Self-pay | Admitting: Family Medicine

## 2013-05-12 NOTE — Procedures (Signed)
NAMEBEE, HAMMERSCHMIDT                  ACCOUNT NO.:  [id number]  MEDICAL RECORD NO.:  [id number]  LOCATION:                                 FACILITY:  PHYSICIAN:  Beanca Kiester L. Luan Pulling, M.D.DATE OF BIRTH:  [birth year]  DATE OF PROCEDURE:  05/11/2013 DATE OF DISCHARGE:                           PULMONARY FUNCTION TEST   REASON FOR PULMONARY FUNCTION TESTING: 1. Spirometry shows a mild ventilatory defect with evidence of airflow     obstruction. 2. Lung volumes show air trapping. 3. Airway resistance is normal. 4. DLCO is mildly reduced. 5. This study is consistent with COPD.     Nikoloz Huy L. Luan Pulling, M.D.     EL[facility]/MEDQ  D:  05/11/2013  T:  05/12/2013  Job:  [id number]  cc:   Moshe Cipro

## 2013-06-28 ENCOUNTER — Other Ambulatory Visit: Payer: Self-pay | Admitting: Family Medicine

## 2013-09-05 ENCOUNTER — Other Ambulatory Visit: Payer: Self-pay | Admitting: Family Medicine

## 2013-09-13 ENCOUNTER — Ambulatory Visit (INDEPENDENT_AMBULATORY_CARE_PROVIDER_SITE_OTHER): Payer: BC Managed Care – PPO | Admitting: Family Medicine

## 2013-09-13 ENCOUNTER — Encounter: Payer: Self-pay | Admitting: Family Medicine

## 2013-09-13 VITALS — BP 130/80 | HR 86 | Resp 16 | Wt 218.1 lb

## 2013-09-13 DIAGNOSIS — Z23 Encounter for immunization: Secondary | ICD-10-CM

## 2013-09-13 DIAGNOSIS — E785 Hyperlipidemia, unspecified: Secondary | ICD-10-CM

## 2013-09-13 DIAGNOSIS — Z0000 Encounter for general adult medical examination without abnormal findings: Secondary | ICD-10-CM

## 2013-09-13 DIAGNOSIS — I10 Essential (primary) hypertension: Secondary | ICD-10-CM

## 2013-09-13 MED ORDER — AMLODIPINE BESYLATE 10 MG PO TABS: ORAL_TABLET | ORAL | Status: DC

## 2013-09-13 NOTE — Patient Instructions (Addendum)
F/u with rectal in November , call if you need me before  Fasting labs this week please   Skin areas do not appear concerning, we will continue to follow, they are areas where you are losing pigment, which is not uncommon  Congrats on weight loss, keep it up  Please continue to cut back on e cigarette use, lung function test shows that you have cOPD, this will improve as you discontinue nicotine  Pneumovac 13 today

## 2013-09-13 NOTE — Progress Notes (Signed)
Subjective:    Patient ID: Todd Randall, male    DOB: 1955-07-01, 58 y.o.   MRN: [id number]  HPI  Preventive Screening-Counseling & Management   Patient present here today for an intial  Medicare annual wellness visit.   Current Problems (verified)   Medications Prior to Visit Allergies (verified)   PAST HISTORY  Family History  Social History Married , h/o alcohol use , cureently using e cigaretteds trying to quit   Risk Factors  Current exercise habits:  On average 3 times per week, walks, needs to increase to 5 days per week minimum  Dietary issues discussed:reduced sodium intake, reduced fat, heart healthy diet. Limit gout producing foods   Cardiac risk factors: None significant  Depression Screen  (Note: if answer to either of the following is "Yes", a more complete depression screening is indicated)   Over the past two weeks, have you felt down, depressed or hopeless? No  Over the past two weeks, have you felt little interest or pleasure in doing things? No  Have you lost interest or pleasure in daily life? No  Do you often feel hopeless? No  Do you cry easily over simple problems? No   Activities of Daily Living  In your present state of health, do you have any difficulty performing the following activities?  Driving?: No Managing money?: No Feeding yourself?:No Getting from bed to chair?:No Climbing a flight of stairs?:No Preparing food and eating?:No Bathing or showering?:No Getting dressed?:No Getting to the toilet?:No Using the toilet?:No Moving around from place to place?: No  Fall Risk Assessment In the past year have you fallen or had a near fall?:No Are you currently taking any medications that make you dizzy?:No   Hearing Difficulties: No Do you often ask people to speak up or repeat themselves?:No Do you experience ringing or noises in your ears?:No Do you have difficulty understanding soft or whispered voices?:No  Cognitive Testing    Alert? Yes Normal Appearance?Yes  Oriented to person? Yes Place? Yes  Time? Yes  Displays appropriate judgment?Yes  Can read the correct time from a watch face? yes Are you having problems remembering things?No  Advanced Directives have been discussed with the patient?Yes , ull code   List the Names of Other Physician/Practitioners you currently use:    Indicate any recent Medical Services you may have received from other than [facility] providers in the past year (date may be approximate).   Assessment:    Annual Wellness Exam   Plan:      Medicare Attestation  I have personally reviewed:  The patient's medical and social history  Their use of alcohol, tobacco or illicit drugs  Their current medications and supplements  The patient's functional ability including ADLs,fall risks, home safety risks, cognitive, and hearing and visual impairment  Diet and physical activities  Evidence for depression or mood disorders  The patient's weight, height, BMI, and visual acuity have been recorded in the chart. I have made referrals, counseling, and provided education to the patient based on review of the above and I have provided the patient with a written personalized care plan for preventive services.     Review of Systems     Objective:   Physical Exam        Assessment & Plan:  Dyslipidemia    Need for vaccination with 13-polyvalent pneumococcal conjugate vaccine Administered at visit  Medicare annual wellness visit, initial Annual exam as documented. Counseling done  re healthy lifestyle involving commitment  to 150 minutes exercise per week, heart healthy diet, and attaining healthy weight.The importance of adequate sleep also discussed. Regular seat belt use and safe storage  of firearms if patient has them, is also discussed. Changes in health habits are decided on by the patient with goals and time frames  set for achieving them. Immunization and cancer screening  needs are specifically addressed at this visit.

## 2013-09-20 ENCOUNTER — Encounter: Payer: Self-pay | Admitting: Family Medicine

## 2013-09-20 LAB — CBC WITH DIFFERENTIAL/PLATELET
Basophils Absolute: 0 10*3/uL (ref 0.0–0.1)
Basophils Relative: 0 % (ref 0–1)
Eosinophils Absolute: 0.1 10*3/uL (ref 0.0–0.7)
Eosinophils Relative: 3 % (ref 0–5)
HCT: 40.5 % (ref 39.0–52.0)
Hemoglobin: 12.6 g/dL — ABNORMAL LOW (ref 13.0–17.0)
Lymphocytes Relative: 29 % (ref 12–46)
Lymphs Abs: 1.4 10*3/uL (ref 0.7–4.0)
MCH: 25.9 pg — ABNORMAL LOW (ref 26.0–34.0)
MCHC: 31.1 g/dL (ref 30.0–36.0)
MCV: 83.2 fL (ref 78.0–100.0)
Monocytes Absolute: 0.3 10*3/uL (ref 0.1–1.0)
Monocytes Relative: 6 % (ref 3–12)
Neutro Abs: 3 10*3/uL (ref 1.7–7.7)
Neutrophils Relative %: 62 % (ref 43–77)
Platelets: 269 10*3/uL (ref 150–400)
RBC: 4.87 MIL/uL (ref 4.22–5.81)
RDW: 16.3 % — ABNORMAL HIGH (ref 11.5–15.5)
WBC: 4.9 10*3/uL (ref 4.0–10.5)

## 2013-09-20 LAB — BASIC METABOLIC PANEL
BUN: 16 mg/dL (ref 6–23)
CO2: 27 mEq/L (ref 19–32)
Calcium: 9.7 mg/dL (ref 8.4–10.5)
Chloride: 104 mEq/L (ref 96–112)
Creat: 0.79 mg/dL (ref 0.50–1.35)
Glucose, Bld: 91 mg/dL (ref 70–99)
Potassium: 4.2 mEq/L (ref 3.5–5.3)
Sodium: 141 mEq/L (ref 135–145)

## 2013-09-20 LAB — LIPID PANEL
Cholesterol: 175 mg/dL (ref 0–200)
HDL: 38 mg/dL — ABNORMAL LOW (ref >39)
LDL Cholesterol: 108 mg/dL — ABNORMAL HIGH (ref 0–99)
Total CHOL/HDL Ratio: 4.6 Ratio
Triglycerides: 143 mg/dL (ref <150)
VLDL: 29 mg/dL (ref 0–40)

## 2013-09-20 LAB — PSA: PSA: 3.12 ng/mL (ref <=4.00)

## 2013-10-03 ENCOUNTER — Other Ambulatory Visit: Payer: Self-pay | Admitting: Family Medicine

## 2013-10-23 ENCOUNTER — Encounter: Payer: Self-pay | Admitting: Family Medicine

## 2013-10-23 DIAGNOSIS — Z0000 Encounter for general adult medical examination without abnormal findings: Secondary | ICD-10-CM | POA: Insufficient documentation

## 2013-10-23 DIAGNOSIS — Z23 Encounter for immunization: Secondary | ICD-10-CM | POA: Insufficient documentation

## 2013-10-23 NOTE — Assessment & Plan Note (Deleted)
No recent flare , advised pt to continue allopurinol daily, and to avoid foods and beverage that aggravate a flare

## 2013-10-23 NOTE — Assessment & Plan Note (Deleted)
impoved with reduced smoking Continue daily medications to help breathing

## 2013-10-23 NOTE — Assessment & Plan Note (Signed)
Administered at visit 

## 2013-10-23 NOTE — Assessment & Plan Note (Signed)
Annual exam as documented. Counseling done  re healthy lifestyle involving commitment to 150 minutes exercise per week, heart healthy diet, and attaining healthy weight.The importance of adequate sleep also discussed. Regular seat belt use and safe storage  of firearms if patient has them, is also discussed. Changes in health habits are decided on by the patient with goals and time frames  set for achieving them. Immunization and cancer screening needs are specifically addressed at this visit.  

## 2013-10-23 NOTE — Assessment & Plan Note (Deleted)
Controlled, no change in medication DASH diet and commitment to daily physical activity for a minimum of 30 minutes discussed and encouraged, as a part of hypertension management. The importance of attaining a healthy weight is also discussed.  

## 2013-10-23 NOTE — Assessment & Plan Note (Deleted)
Improved. Pt applauded on succesful weight loss through lifestyle change, and encouraged to continue same. Weight loss goal set for the next several months.  

## 2013-12-09 ENCOUNTER — Other Ambulatory Visit: Payer: Self-pay

## 2013-12-09 DIAGNOSIS — M109 Gout, unspecified: Secondary | ICD-10-CM

## 2013-12-09 MED ORDER — INDOMETHACIN 50 MG PO CAPS: ORAL_CAPSULE | ORAL | Status: AC

## 2014-01-17 ENCOUNTER — Encounter: Payer: Self-pay | Admitting: Family Medicine

## 2014-01-17 ENCOUNTER — Ambulatory Visit (INDEPENDENT_AMBULATORY_CARE_PROVIDER_SITE_OTHER): Payer: BC Managed Care – PPO | Admitting: Family Medicine

## 2014-01-17 VITALS — BP 124/80 | HR 89 | Resp 16 | Ht 66.0 in | Wt 225.4 lb

## 2014-01-17 DIAGNOSIS — J438 Other emphysema: Secondary | ICD-10-CM

## 2014-01-17 DIAGNOSIS — Z23 Encounter for immunization: Secondary | ICD-10-CM

## 2014-01-17 DIAGNOSIS — E669 Obesity, unspecified: Secondary | ICD-10-CM

## 2014-01-17 DIAGNOSIS — I10 Essential (primary) hypertension: Secondary | ICD-10-CM

## 2014-01-17 DIAGNOSIS — Z86010 Personal history of colonic polyps: Secondary | ICD-10-CM

## 2014-01-17 DIAGNOSIS — D649 Anemia, unspecified: Secondary | ICD-10-CM

## 2014-01-17 DIAGNOSIS — Z1211 Encounter for screening for malignant neoplasm of colon: Secondary | ICD-10-CM

## 2014-01-17 LAB — CBC WITH DIFFERENTIAL/PLATELET
Basophils Absolute: 0 10*3/uL (ref 0.0–0.1)
Basophils Relative: 0 % (ref 0–1)
Eosinophils Absolute: 0.2 10*3/uL (ref 0.0–0.7)
Eosinophils Relative: 4 % (ref 0–5)
HCT: 40.4 % (ref 39.0–52.0)
Hemoglobin: 12.8 g/dL — ABNORMAL LOW (ref 13.0–17.0)
Lymphocytes Relative: 32 % (ref 12–46)
Lymphs Abs: 1.8 10*3/uL (ref 0.7–4.0)
MCH: 26.2 pg (ref 26.0–34.0)
MCHC: 31.7 g/dL (ref 30.0–36.0)
MCV: 82.6 fL (ref 78.0–100.0)
Monocytes Absolute: 0.4 10*3/uL (ref 0.1–1.0)
Monocytes Relative: 8 % (ref 3–12)
Neutro Abs: 3.1 10*3/uL (ref 1.7–7.7)
Neutrophils Relative %: 56 % (ref 43–77)
Platelets: 264 10*3/uL (ref 150–400)
RBC: 4.89 MIL/uL (ref 4.22–5.81)
RDW: 16.4 % — ABNORMAL HIGH (ref 11.5–15.5)
WBC: 5.6 10*3/uL (ref 4.0–10.5)

## 2014-01-17 LAB — POC HEMOCCULT BLD/STL (OFFICE/1-CARD/DIAGNOSTIC): Fecal Occult Blood, POC: POSITIVE

## 2014-01-17 NOTE — Progress Notes (Signed)
   Subjective:    Patient ID: Todd Randall, male    DOB: [birth year], 58 y.o.   MRN: [id number]  HPI The PT is here for follow up and re-evaluation of chronic medical conditions, medication management and review of any available recent lab and radiology data.  Preventive health is updated, specifically  Cancer screening and Immunization.   Questions or concerns regarding consultations or procedures which the PT has had in the interim are  addressed. The PT denies any adverse reactions to current medications since the last visit.  Noted increased anxiety, has completed degree in counseling now looking for a job       Review of Systems See HPI Denies recent fever or chills. Denies sinus pressure, nasal congestion, ear pain or sore throat. Denies chest congestion, productive cough or wheezing. Denies chest pains, palpitations and leg swelling Denies abdominal pain, nausea, vomiting,diarrhea or constipation.   Denies dysuria, frequency, hesitancy or incontinence. Denies joint pain, swelling and limitation in mobility. Denies headaches, seizures, numbness, or tingling. Denies depression,  Does note mild anxiety denies  insomnia. Denies skin break down or rash.        Objective:   Physical Exam BP 124/80 mmHg  Pulse 89  Resp 16  Ht 5\' 6"  (1.676 m)  Wt 225 lb 6.4 oz (102.241 kg)  BMI 36.40 kg/m2  SpO2 95% Patient alert and oriented and in no cardiopulmonary distress.  HEENT: No facial asymmetry, EOMI,   oropharynx pink and moist.  Neck supple no JVD, no mass.  Chest: Clear to auscultation bilaterally.Decfreased though adequate air entry  CVS: S1, S2 no murmurs, no S3.Regular rate.  ABD: Soft non tender. No organomegaly or mass, normal Bs Rectal: no mass, heme positive stool  Ext: No edema  MS: Adequate ROM spine, shoulders, hips and knees.  Skin: Intact, no ulcerations or rash noted.  Psych: Good eye contact, normal affect. Memory intact not anxious or depressed  appearing.  CNS: CN 2-12 intact, power,  normal throughout.no focal deficits noted.        Assessment & Plan:  Essential hypertension Controlled, no change in medication DASH diet and commitment to daily physical activity for a minimum of 30 minutes discussed and encouraged, as a part of hypertension management. The importance of attaining a healthy weight is also discussed.   Anemia Re eval with lab today., If marked difference will refer for GI re eval  COPD with emphysema Improved with less nicotine use, now only using e cigarette. Does not use daily med due to cost , and states he is less dyspneic with activity  Obesity (BMI 30.0-34.9) Deteriorated. Patient re-educated about  the importance of commitment to a  minimum of 150 minutes of exercise per week. The importance of healthy food choices with portion control discussed. Encouraged to start a food diary, count calories and to consider  joining a support group. Sample diet sheets offered. Goals set by the patient for the next several months.     Special screening for malignant neoplasms, colon Heme positive stool and hemorhoids, will check HB, most recent colonoscopy was i 2014, showed piles and diverticulosis. Pt denies change in stool caliber, and he is gaining , not losing weight  Need for prophylactic vaccination and inoculation against influenza Vaccine administered on 01/17/2014 during visit

## 2014-01-17 NOTE — Assessment & Plan Note (Signed)
Controlled, no change in medication DASH diet and commitment to daily physical activity for a minimum of 30 minutes discussed and encouraged, as a part of hypertension management. The importance of attaining a healthy weight is also discussed.  

## 2014-01-17 NOTE — Patient Instructions (Addendum)
F/u in 4 month, call if you need me before  Flu vaccine today  You have hidden blood in your stool, you do have diverticulosis which may cause small amount of blood loss from time  to time that you may be unaware of. Since this is  New information for you" and you arare very concerned , I will refer you  To Dr Laural Golden to discuss further, if you wish (was told pt declined referral)  Prostate blood test was normal in 09/2013, prostate on exam today is enlarged and smooth  Call places I listed re jobs, and congrats on exam success   It is important that you exercise regularly at least 30 minutes 5 times a week. If you develop chest pain, have severe difficulty breathing, or feel very tired, stop exercising immediately and seek medical attention   A healthy diet is rich in fruit, vegetables and whole grains. Poultry fish, nuts and beans are a healthy choice for protein rather then red meat. A low sodium diet and drinking 64 ounces of water daily is generally recommended. Oils and sweet should be limited. Carbohydrates especially for those who are diabetic or overweight, should be limited to 34-45 gram per meal. It is important to eat on a regular schedule, at least 3 times daily. Snacks should be primarily fruits, vegetables or nuts. Start eating evening meal for breakfast and luinch!  CBC, iron and ferritin, chem 7 and TSH today

## 2014-01-18 LAB — BASIC METABOLIC PANEL
BUN: 16 mg/dL (ref 6–23)
CO2: 29 mEq/L (ref 19–32)
Calcium: 9.8 mg/dL (ref 8.4–10.5)
Chloride: 101 mEq/L (ref 96–112)
Creat: 0.8 mg/dL (ref 0.50–1.35)
Glucose, Bld: 99 mg/dL (ref 70–99)
Potassium: 4 mEq/L (ref 3.5–5.3)
Sodium: 140 mEq/L (ref 135–145)

## 2014-01-18 LAB — IRON: Iron: 65 ug/dL (ref 42–165)

## 2014-01-18 LAB — FERRITIN: Ferritin: 641 ng/mL — ABNORMAL HIGH (ref 22–322)

## 2014-01-18 LAB — TSH: TSH: 0.993 u[IU]/mL (ref 0.350–4.500)

## 2014-01-20 ENCOUNTER — Other Ambulatory Visit: Payer: Self-pay | Admitting: Family Medicine

## 2014-01-29 DIAGNOSIS — Z1211 Encounter for screening for malignant neoplasm of colon: Secondary | ICD-10-CM | POA: Insufficient documentation

## 2014-01-29 NOTE — Assessment & Plan Note (Signed)
Improved with less nicotine use, now only using e cigarette. Does not use daily med due to cost , and states he is less dyspneic with activity

## 2014-01-29 NOTE — Assessment & Plan Note (Signed)
Deteriorated. Patient re-educated about  the importance of commitment to a  minimum of 150 minutes of exercise per week. The importance of healthy food choices with portion control discussed. Encouraged to start a food diary, count calories and to consider  joining a support group. Sample diet sheets offered. Goals set by the patient for the next several months.    

## 2014-01-29 NOTE — Assessment & Plan Note (Signed)
Re eval with lab today., If marked difference will refer for GI re eval

## 2014-01-29 NOTE — Assessment & Plan Note (Signed)
Heme positive stool and hemorhoids, will check HB, most recent colonoscopy was i 2014, showed piles and diverticulosis. Pt denies change in stool caliber, and he is gaining , not losing weight

## 2014-01-30 ENCOUNTER — Ambulatory Visit (INDEPENDENT_AMBULATORY_CARE_PROVIDER_SITE_OTHER): Payer: BC Managed Care – PPO

## 2014-01-30 DIAGNOSIS — Z23 Encounter for immunization: Secondary | ICD-10-CM

## 2014-01-30 NOTE — Assessment & Plan Note (Signed)
Vaccine administered on 01/17/2014 during visit

## 2014-01-31 ENCOUNTER — Ambulatory Visit: Payer: BC Managed Care – PPO | Admitting: Family Medicine

## 2014-02-06 ENCOUNTER — Other Ambulatory Visit: Payer: Self-pay | Admitting: Family Medicine

## 2014-03-06 ENCOUNTER — Other Ambulatory Visit: Payer: Self-pay | Admitting: Family Medicine

## 2014-05-18 ENCOUNTER — Ambulatory Visit: Payer: BC Managed Care – PPO | Admitting: Family Medicine

## 2014-06-01 ENCOUNTER — Encounter: Payer: Self-pay | Admitting: Family Medicine

## 2014-06-01 ENCOUNTER — Ambulatory Visit (INDEPENDENT_AMBULATORY_CARE_PROVIDER_SITE_OTHER): Payer: 59 | Admitting: Family Medicine

## 2014-06-01 VITALS — BP 130/70 | HR 90 | Resp 16 | Ht 66.0 in | Wt 223.0 lb

## 2014-06-01 DIAGNOSIS — E66811 Obesity, class 1: Secondary | ICD-10-CM

## 2014-06-01 DIAGNOSIS — E669 Obesity, unspecified: Secondary | ICD-10-CM

## 2014-06-01 DIAGNOSIS — Z789 Other specified health status: Secondary | ICD-10-CM

## 2014-06-01 DIAGNOSIS — Z1159 Encounter for screening for other viral diseases: Secondary | ICD-10-CM

## 2014-06-01 DIAGNOSIS — J439 Emphysema, unspecified: Secondary | ICD-10-CM

## 2014-06-01 DIAGNOSIS — F17200 Nicotine dependence, unspecified, uncomplicated: Secondary | ICD-10-CM

## 2014-06-01 DIAGNOSIS — E790 Hyperuricemia without signs of inflammatory arthritis and tophaceous disease: Secondary | ICD-10-CM

## 2014-06-01 DIAGNOSIS — M1000 Idiopathic gout, unspecified site: Secondary | ICD-10-CM

## 2014-06-01 DIAGNOSIS — I10 Essential (primary) hypertension: Secondary | ICD-10-CM

## 2014-06-01 DIAGNOSIS — Z720 Tobacco use: Secondary | ICD-10-CM

## 2014-06-01 NOTE — Patient Instructions (Addendum)
F/U in  late august, call if you need me before  Blood pressure is good  Pls carve out 30 mins total 5 days per week for exercise for health   Reduce fatty foods and watch carbs and sweets, so you prevent diabetes   Fasting chem 7 and uric acid level and HIV in next 1 to 2 weeks  You will need fasting labs for August visit and these will be mailed afteryou get the upcoming ones

## 2014-06-01 NOTE — Progress Notes (Signed)
   Subjective:    Patient ID: Todd Randall, male    DOB: November 01, 1955, 59 y.o.   MRN: [id number]  HPI The PT is here for follow up and re-evaluation of chronic medical conditions, medication management and review of any available recent lab and radiology data.  Preventive health is updated, specifically  Cancer screening and Immunization.   The PT denies any adverse reactions to current medications since the last visit.  He is now employed, and states he hs poor eating habits and feels as though he has no time to exercise, he is encouraged to reconsider boith of these attitudes and to also work on stopping e cigarette use      Review of Systems See HPI Denies recent fever or chills. Denies sinus pressure, nasal congestion, ear pain or sore throat. Denies chest congestion, productive cough or wheezing. Denies chest pains, palpitations and leg swelling Denies abdominal pain, nausea, vomiting,diarrhea or constipation.   Denies dysuria, frequency, hesitancy or incontinence. Denies joint pain, swelling and limitation in mobility. Denies headaches, seizures, numbness, or tingling. Denies depression, anxiety or insomnia. Denies skin break down or rash.        Objective:   Physical Exam  BP 130/70 mmHg  Pulse 90  Resp 16  Ht 5\' 6"  (1.676 m)  Wt 223 lb (101.152 kg)  BMI 36.01 kg/m2  SpO2 94% Patient alert and oriented and in no cardiopulmonary distress.  HEENT: No facial asymmetry, EOMI,   oropharynx pink and moist.  Neck supple no JVD, no mass.  Chest: Clear to auscultation bilaterally.Decreased though adequate air entry   CVS: S1, S2 no murmurs, no S3.Regular rate.  ABD: Soft non tender.   Ext: No edema  MS: Adequate ROM spine, shoulders, hips and knees.  Skin: Intact, no ulcerations or rash noted.  Psych: Good eye contact, normal affect. Memory intact not anxious or depressed appearing.  CNS: CN 2-12 intact, power,  normal throughout.no focal deficits noted.      Assessment & Plan:  Essential hypertension Controlled, no change in medication DASH diet and commitment to daily physical activity for a minimum of 30 minutes discussed and encouraged, as a part of hypertension management. The importance of attaining a healthy weight is also discussed.    COPD with emphysema Stable and controlled, no med change, no recent need for albuterol   Obesity (BMI 30.0-34.9) Deteriorated. Patient re-educated about  the importance of commitment to a  minimum of 150 minutes of exercise per week. The importance of healthy food choices with portion control discussed. Encouraged to start a food diary, count calories and to consider  joining a support group. Sample diet sheets offered. Goals set by the patient for the next several months.      Gout Asymptomatic, non compliant with allopurinol, need to check uric acid level    NICOTINE ADDICTION Unchanged, still somewhat resistant to the need to quit, Patient counseled for approximately 5 minutes regarding the health risks of ongoing nicotine use, specifically all types of cancer, heart disease, stroke and respiratory failure. The options available for help with cessation ,the behavioral changes to assist the process, and the option to either gradully reduce usage  Or abruptly stop.is also discussed. Pt is also encouraged to set specific goals in number of cigarettes used daily, as well as to set a quit date.

## 2014-06-02 ENCOUNTER — Encounter: Payer: Self-pay | Admitting: Family Medicine

## 2014-06-02 DIAGNOSIS — Z789 Other specified health status: Secondary | ICD-10-CM

## 2014-06-02 DIAGNOSIS — Z7289 Other problems related to lifestyle: Secondary | ICD-10-CM | POA: Insufficient documentation

## 2014-06-02 DIAGNOSIS — Z720 Tobacco use: Secondary | ICD-10-CM | POA: Insufficient documentation

## 2014-06-02 NOTE — Assessment & Plan Note (Signed)
Deteriorated. Patient re-educated about  the importance of commitment to a  minimum of 150 minutes of exercise per week. The importance of healthy food choices with portion control discussed. Encouraged to start a food diary, count calories and to consider  joining a support group. Sample diet sheets offered. Goals set by the patient for the next several months.    

## 2014-06-02 NOTE — Assessment & Plan Note (Signed)
Controlled, no change in medication DASH diet and commitment to daily physical activity for a minimum of 30 minutes discussed and encouraged, as a part of hypertension management. The importance of attaining a healthy weight is also discussed.  

## 2014-06-02 NOTE — Assessment & Plan Note (Signed)
Asymptomatic, non compliant with allopurinol, need to check uric acid level

## 2014-06-02 NOTE — Assessment & Plan Note (Signed)
Stable and controlled, no med change, no recent need for albuterol

## 2014-06-02 NOTE — Assessment & Plan Note (Signed)
Unchanged, still somewhat resistant to the need to quit, Patient counseled for approximately 5 minutes regarding the health risks of ongoing nicotine use, specifically all types of cancer, heart disease, stroke and respiratory failure. The options available for help with cessation ,the behavioral changes to assist the process, and the option to either gradully reduce usage  Or abruptly stop.is also discussed. Pt is also encouraged to set specific goals in number of cigarettes used daily, as well as to set a quit date.

## 2014-06-06 ENCOUNTER — Encounter: Payer: Self-pay | Admitting: Family Medicine

## 2014-06-06 LAB — BASIC METABOLIC PANEL
BUN: 12 mg/dL (ref 6–23)
CO2: 31 mEq/L (ref 19–32)
Calcium: 9.7 mg/dL (ref 8.4–10.5)
Chloride: 98 mEq/L (ref 96–112)
Creat: 0.8 mg/dL (ref 0.50–1.35)
Glucose, Bld: 83 mg/dL (ref 70–99)
Potassium: 3.9 mEq/L (ref 3.5–5.3)
Sodium: 137 mEq/L (ref 135–145)

## 2014-06-06 LAB — HIV ANTIBODY (ROUTINE TESTING W REFLEX): HIV 1&2 Ab, 4th Generation: NONREACTIVE

## 2014-06-06 LAB — URIC ACID: Uric Acid, Serum: 6.5 mg/dL (ref 4.0–7.8)

## 2014-06-07 ENCOUNTER — Encounter: Payer: Self-pay | Admitting: Family Medicine

## 2014-06-29 ENCOUNTER — Encounter: Payer: Self-pay | Admitting: Family Medicine

## 2014-07-02 ENCOUNTER — Other Ambulatory Visit: Payer: Self-pay | Admitting: Family Medicine

## 2014-07-02 DIAGNOSIS — M25551 Pain in right hip: Secondary | ICD-10-CM

## 2014-07-13 ENCOUNTER — Other Ambulatory Visit: Payer: Self-pay | Admitting: Family Medicine

## 2014-10-06 ENCOUNTER — Other Ambulatory Visit: Payer: Self-pay | Admitting: Family Medicine

## 2014-10-10 ENCOUNTER — Telehealth: Payer: Self-pay | Admitting: *Deleted

## 2014-10-10 DIAGNOSIS — J4530 Mild persistent asthma, uncomplicated: Secondary | ICD-10-CM

## 2014-10-10 MED ORDER — TIOTROPIUM BROMIDE MONOHYDRATE 18 MCG IN CAPS: 18.0000 ug | ORAL_CAPSULE | Freq: Every day | RESPIRATORY_TRACT | Status: DC

## 2014-10-10 MED ORDER — FLUTICASONE-SALMETEROL 250-50 MCG/DOSE IN AEPB: 1.0000 | INHALATION_SPRAY | Freq: Two times a day (BID) | RESPIRATORY_TRACT | Status: DC

## 2014-10-10 NOTE — Telephone Encounter (Signed)
meds sent

## 2014-10-10 NOTE — Telephone Encounter (Signed)
Pt called requesting advair and aubrva to be sent to the [facility] pharmacy.

## 2014-11-13 ENCOUNTER — Other Ambulatory Visit: Payer: Self-pay | Admitting: Family Medicine

## 2014-11-15 ENCOUNTER — Encounter: Payer: Self-pay | Admitting: Family Medicine

## 2014-11-15 ENCOUNTER — Ambulatory Visit (INDEPENDENT_AMBULATORY_CARE_PROVIDER_SITE_OTHER): Payer: 59 | Admitting: Family Medicine

## 2014-11-15 VITALS — BP 130/80 | HR 100 | Resp 18 | Ht 66.0 in | Wt 221.0 lb

## 2014-11-15 DIAGNOSIS — Z1159 Encounter for screening for other viral diseases: Secondary | ICD-10-CM | POA: Diagnosis not present

## 2014-11-15 DIAGNOSIS — Z800 Family history of malignant neoplasm of digestive organs: Secondary | ICD-10-CM

## 2014-11-15 DIAGNOSIS — Z23 Encounter for immunization: Secondary | ICD-10-CM | POA: Diagnosis not present

## 2014-11-15 DIAGNOSIS — Z638 Other specified problems related to primary support group: Secondary | ICD-10-CM | POA: Insufficient documentation

## 2014-11-15 DIAGNOSIS — Z125 Encounter for screening for malignant neoplasm of prostate: Secondary | ICD-10-CM | POA: Diagnosis not present

## 2014-11-15 DIAGNOSIS — E669 Obesity, unspecified: Secondary | ICD-10-CM

## 2014-11-15 DIAGNOSIS — I10 Essential (primary) hypertension: Secondary | ICD-10-CM

## 2014-11-15 NOTE — Assessment & Plan Note (Signed)
Unchanged. Patient re-educated about  the importance of commitment to a  minimum of 150 minutes of exercise per week.  The importance of healthy food choices with portion control discussed. Encouraged to start a food diary, count calories and to consider  joining a support group. Sample diet sheets offered. Goals set by the patient for the next several months.   Weight /BMI 11/15/2014 06/01/2014 01/17/2014  WEIGHT 221 lb 223 lb 225 lb 6.4 oz  HEIGHT 5\' 6"  5\' 6"  5\' 6"   BMI 35.69 kg/m2 36.01 kg/m2 36.4 kg/m2   Current exercise per week 30 minutes

## 2014-11-15 NOTE — Assessment & Plan Note (Signed)
After obtaining informed consent, the vaccine is  administered by LPN.  

## 2014-11-15 NOTE — Assessment & Plan Note (Signed)
Needs colonoscopy every 5 years

## 2014-11-15 NOTE — Patient Instructions (Signed)
CPE in January, call if you need me before  Flu vaccine today  Please work on good  health habits so that your health will improve. 1. Commitment to daily physical activity for 30 to 60  minutes, if you are able to do this.  2. Commitment to wise food choices. Aim for half of your  food intake to be vegetable and fruit, one quarter starchy foods, and one quarter protein. Try to eat on a regular schedule  3 meals per day, snacking between meals should be limited to vegetables or fruits or small portions of nuts. 64 ounces of water per day is generally recommended, unless you have specific health conditions, like heart failure or kidney failure where you will need to limit fluid intake.  3. Commitment to sufficient and a  good quality of physical and mental rest daily, generally between 6 to 8 hours per day.  WITH PERSISTANCE AND PERSEVERANCE, THE IMPOSSIBLE , BECOMES THE NORM!  Fasting labs asap  Thanks for choosing [facility], we consider it a privelige to serve you.

## 2014-11-15 NOTE — Assessment & Plan Note (Signed)
Controlled, no change in medication DASH diet and commitment to daily physical activity for a minimum of 30 minutes discussed and encouraged, as a part of hypertension management. The importance of attaining a healthy weight is also discussed.  BP/Weight 11/15/2014 06/01/2014 01/17/2014 09/13/2013 04/07/2013 12/16/2012 1/83/3582  Systolic BP 518 984 210 312 [phone]  Diastolic BP 80 70 80 80 84 82 82  Wt. (Lbs) 221 223 225.4 218.12 229.12 221.08 222.4  BMI 35.69 36.01 36.4 35.22 37 35.7 35.91

## 2014-11-15 NOTE — Progress Notes (Signed)
Todd Randall     MRN: [id number]      DOB: 07-Oct-1955   HPI Mr. Derick is here for follow up and re-evaluation of chronic medical conditions, medication management and review of any available recent lab and radiology data.  Preventive health is updated, specifically  Cancer screening and Immunization.   . The PT denies any adverse reactions to current medications since the last visit.  C/o working all the time, no exercise and disruption of his marriages   ROS Denies recent fever or chills. Denies sinus pressure, nasal congestion, ear pain or sore throat. Denies chest congestion, productive cough or wheezing. Denies chest pains, palpitations and leg swelling Denies abdominal pain, nausea, vomiting,diarrhea or constipation.   Denies dysuria, frequency, hesitancy or incontinence. Denies joint pain, swelling and limitation in mobility. Denies headaches, seizures, numbness, or tingling. Increased stress, and anxiety, poor sleep, overeating , gets up in the night eating, relationship with his wife is down the tubes, and he is uncertain as to what is actually going on Denies skin break down or rash.   PE  BP 130/80 mmHg  Pulse 100  Resp 18  Ht 5\' 6"  (1.676 m)  Wt 221 lb (100.245 kg)  BMI 35.69 kg/m2  SpO2 96%  Patient alert and oriented and in no cardiopulmonary distress.  HEENT: No facial asymmetry, EOMI,   oropharynx pink and moist.  Neck supple no JVD, no mass.  Chest: Clear to auscultation bilaterally.  CVS: S1, S2 no murmurs, no S3.Regular rate.  ABD: Soft non tender.   Ext: No edema  MS: Adequate ROM spine, shoulders, hips and knees.  Skin: Intact, no ulcerations or rash noted.  Psych: Good eye contact, normal affect. Memory intact not anxious or depressed appearing.  CNS: CN 2-12 intact, power,  normal throughout.no focal deficits noted.   Assessment & Plan   Essential hypertension Controlled, no change in medication DASH diet and commitment to daily  physical activity for a minimum of 30 minutes discussed and encouraged, as a part of hypertension management. The importance of attaining a healthy weight is also discussed.  BP/Weight 11/15/2014 06/01/2014 01/17/2014 09/13/2013 04/07/2013 12/16/2012 2/77/4128  Systolic BP 786 767 209 470 [phone]  Diastolic BP 80 70 80 80 84 82 82  Wt. (Lbs) 221 223 225.4 218.12 229.12 221.08 222.4  BMI 35.69 36.01 36.4 35.22 37 35.7 35.91        Obesity (BMI 30.0-34.9) Unchanged. Patient re-educated about  the importance of commitment to a  minimum of 150 minutes of exercise per week.  The importance of healthy food choices with portion control discussed. Encouraged to start a food diary, count calories and to consider  joining a support group. Sample diet sheets offered. Goals set by the patient for the next several months.   Weight /BMI 11/15/2014 06/01/2014 01/17/2014  WEIGHT 221 lb 223 lb 225 lb 6.4 oz  HEIGHT 5\' 6"  5\' 6"  5\' 6"   BMI 35.69 kg/m2 36.01 kg/m2 36.4 kg/m2   Current exercise per week 30 minutes   Need for prophylactic vaccination and inoculation against influenza After obtaining informed consent, the vaccine is  administered by LPN.'   Family hx of colon cancer Needs colonoscopy every 5 years  Gout Recent flare  COPD with emphysema Controlled, no change in medication   Stress due to family tension Concerned about breakdown in his marriage and uncertain which direction he wants to take. Encouraged marriage counseling, communication with spouse and offered my services with  both himself and his wife present

## 2014-11-15 NOTE — Assessment & Plan Note (Signed)
Recent flare.

## 2014-11-15 NOTE — Assessment & Plan Note (Signed)
Concerned about breakdown in his marriage and uncertain which direction he wants to take. Encouraged marriage counseling, communication with spouse and offered my services with both himself and his wife present

## 2014-11-15 NOTE — Assessment & Plan Note (Signed)
Controlled, no change in medication  

## 2015-03-13 ENCOUNTER — Other Ambulatory Visit: Payer: Self-pay | Admitting: Family Medicine

## 2015-04-11 ENCOUNTER — Other Ambulatory Visit: Payer: Self-pay

## 2015-04-11 ENCOUNTER — Encounter: Payer: Self-pay | Admitting: Family Medicine

## 2015-04-11 ENCOUNTER — Ambulatory Visit (INDEPENDENT_AMBULATORY_CARE_PROVIDER_SITE_OTHER): Payer: BLUE CROSS/BLUE SHIELD | Admitting: Family Medicine

## 2015-04-11 VITALS — BP 152/80 | HR 84 | Resp 18 | Ht 66.0 in | Wt 235.0 lb

## 2015-04-11 DIAGNOSIS — Z0000 Encounter for general adult medical examination without abnormal findings: Secondary | ICD-10-CM | POA: Diagnosis not present

## 2015-04-11 DIAGNOSIS — Z1159 Encounter for screening for other viral diseases: Secondary | ICD-10-CM

## 2015-04-11 DIAGNOSIS — E669 Obesity, unspecified: Secondary | ICD-10-CM

## 2015-04-11 DIAGNOSIS — F17200 Nicotine dependence, unspecified, uncomplicated: Secondary | ICD-10-CM

## 2015-04-11 DIAGNOSIS — Z1211 Encounter for screening for malignant neoplasm of colon: Secondary | ICD-10-CM

## 2015-04-11 DIAGNOSIS — R079 Chest pain, unspecified: Secondary | ICD-10-CM

## 2015-04-11 DIAGNOSIS — F17290 Nicotine dependence, other tobacco product, uncomplicated: Secondary | ICD-10-CM

## 2015-04-11 DIAGNOSIS — R0789 Other chest pain: Secondary | ICD-10-CM

## 2015-04-11 DIAGNOSIS — E66811 Obesity, class 1: Secondary | ICD-10-CM

## 2015-04-11 DIAGNOSIS — M779 Enthesopathy, unspecified: Secondary | ICD-10-CM

## 2015-04-11 DIAGNOSIS — E790 Hyperuricemia without signs of inflammatory arthritis and tophaceous disease: Secondary | ICD-10-CM

## 2015-04-11 DIAGNOSIS — I10 Essential (primary) hypertension: Secondary | ICD-10-CM

## 2015-04-11 DIAGNOSIS — E785 Hyperlipidemia, unspecified: Secondary | ICD-10-CM

## 2015-04-11 DIAGNOSIS — D508 Other iron deficiency anemias: Secondary | ICD-10-CM

## 2015-04-11 DIAGNOSIS — R9431 Abnormal electrocardiogram [ECG] [EKG]: Secondary | ICD-10-CM

## 2015-04-11 DIAGNOSIS — Z125 Encounter for screening for malignant neoplasm of prostate: Secondary | ICD-10-CM

## 2015-04-11 MED ORDER — PREDNISONE 5 MG PO TABS: 5.0000 mg | ORAL_TABLET | Freq: Two times a day (BID) | ORAL | Status: AC

## 2015-04-11 MED ORDER — SPIRONOLACTONE 25 MG PO TABS: 25.0000 mg | ORAL_TABLET | Freq: Every day | ORAL | Status: DC

## 2015-04-11 NOTE — Patient Instructions (Addendum)
Nurse BP check in 6 weeks  MD follow up in 4.5 month  PLEASE change eating habits  As we discussed, and start walking for 10 10 to 15 minute sessions daily  Prednisone sent for 5 days for tendonitis/ pain and swelling behind right ankle   EKG today is not totally normal and since you are noticing increased fatigue and poor exercise tolerance best to have cardiology evaluate you  Blood pressure elevated,New addtional med is spironolactone one daily  Stop vapes please  Please increase vegetable, decrease salt and sodas and work on weight loss  Fasting labs next door as soon as possible please  All the best

## 2015-04-11 NOTE — Progress Notes (Signed)
Subjective:    Patient ID: Todd Randall, male    DOB: 06/26/1955, 60 y.o.   MRN: [id number]  HPI Patient is in for annual physical exam. C/o exertional fatigue with poor exercise tolerance whjich he notes in the past several months increasingly.He has no exercise commitment, states he "works all the time" and continues to gain weight. Blood pressure is longstanding and is uncontrolled, needs cardiology eval C/o pain and tenderness with mild swelling or right ankle tendon, no known injury, ablwe to stand on tip toe Recent labs, if available are reviewed. Immunization is reviewed , and  updated if needed.   Review of Systems See HPI Denies recent fever or chills. Denies sinus pressure, nasal congestion, ear pain or sore throat. Denies chest congestion, productive cough or wheezing. Denies  palpitations and leg swelling Denies abdominal pain, nausea, vomiting,diarrhea or constipation.   Denies dysuria, frequency, hesitancy or incontinence.  Denies headaches, seizures, numbness, or tingling. Denies depression, anxiety or insomnia. Denies skin break down or rash.        Objective:   Physical Exam BP 152/80 mmHg  Pulse 84  Resp 18  Ht 5\' 6"  (1.676 m)  Wt 235 lb (106.595 kg)  BMI 37.95 kg/m2  SpO2 96%   Pleasant obese male, alert and oriented x 3, in no cardio-pulmonary distress. Afebrile. HEENT No facial trauma or asymetry. Sinuses non tender. EOMI, PERTL, . External ears normal, tympanic membranes clear. Oropharynx moist, no exudate,fairly  good dentition. Neck: supple, no adenopathy,JVD or thyromegaly.No bruits.  Chest: Clear to ascultation bilaterally.No crackles or wheezes.Decreased though adequate air entry Non tender to palpation  Breast: No asymetry,no masses. No nipple discharge or inversion. No axillary or supraclavicular adenopathy  Cardiovascular system; Heart sounds normal,  S1 and  S2 ,no S3.  No murmur, or thrill. Apical beat not  displaced Peripheral pulses normal.  Abdomen: Soft, non tender, no organomegaly or masses. No bruits. Bowel sounds normal. No guarding, tenderness or rebound.  Rectal:  Normal sphincter tone. No hemorrhoids or  masses. guaiac negative stool. Prostate smooth and firm  Musculoskeletal exam: Full ROM of spine, hips , shoulders and knees. No deformity ,swelling or crepitus noted. No muscle wasting or atrophy.  Tender to palpation over right achilles tendon  Neurologic: Cranial nerves 2 to 12 intact. Power, tone ,sensation and reflexes normal throughout. No disturbance in gait. No tremor.  Skin: Intact, no ulceration, erythema , scaling or rash noted. Pigmentation normal throughout  Psych; Normal mood and affect. Judgement and concentration normal         Assessment & Plan:  Essential hypertension UnControlled, add spironolactone, nurse BP check in 6 weeks DASH diet and commitment to daily physical activity for a minimum of 30 minutes discussed and encouraged, as a part of hypertension management. The importance of attaining a healthy weight is also discussed.  BP/Weight 04/11/2015 11/15/2014 06/01/2014 01/17/2014 09/13/2013 04/07/2013 [id number]  Systolic BP 0000000 [id number] AB-[id number]  Diastolic BP 80 80 70 80 80 84 82  Wt. (Lbs) 235 221 223 225.4 218.12 229.12 221.08  BMI 37.95 35.69 36.01 36.4 35.22 37 35.7        Annual physical exam Annual exam as documented. Counseling done  re healthy lifestyle involving commitment to 150 minutes exercise per week, heart healthy diet, and attaining healthy weight.The importance of adequate sleep also discussed. Regular seat belt use and home safety, is also discussed. Changes in health habits are decided on by the patient with goals  and time frames  set for achieving them. Immunization and cancer screening needs are specifically addressed at this visit.   Tendonitis Short course of anti inflammatories prescribed  NICOTINE  ADDICTION Patient counseled for approximately 5 minutes regarding the health risks of ongoing nicotine use, specifically all types of cancer, heart disease, stroke and respiratory failure. The options available for help with cessation ,the behavioral changes to assist the process, and the option to either gradully reduce usage  Or abruptly stop.is also discussed. Pt is also encouraged to set specific goals in number of cigarettes used daily, as well as to set a quit date.  Still using e cigarettes , counseled to quit  Obesity (BMI 30.0-34.9) Deteriorated. Patient re-educated about  the importance of commitment to a  minimum of 150 minutes of exercise per week.  The importance of healthy food choices with portion control discussed. Encouraged to start a food diary, count calories and to consider  joining a support group. Sample diet sheets offered. Goals set by the patient for the next several months.   Weight /BMI 04/11/2015 11/15/2014 06/01/2014  WEIGHT 235 lb 221 lb 223 lb  HEIGHT 5\' 6"  5\' 6"  5\' 6"   BMI 37.95 kg/m2 35.69 kg/m2 36.01 kg/m2    Current exercise per week 30 minutes.   Atypical chest pain Intermittent chest pain, fatigue and poor exercise tolerance.  EKG shows non specific T wave abnormality  Risk factors for CVD are ongoing nicotine use, hypertension, refer cardiology

## 2015-04-11 NOTE — Assessment & Plan Note (Addendum)
UnControlled, add spironolactone, nurse BP check in 6 weeks DASH diet and commitment to daily physical activity for a minimum of 30 minutes discussed and encouraged, as a part of hypertension management. The importance of attaining a healthy weight is also discussed.  BP/Weight 04/11/2015 11/15/2014 06/01/2014 01/17/2014 09/13/2013 04/07/2013 123456  Systolic BP 0000000 [id number] AB-[id number]  Diastolic BP 80 80 70 80 80 84 82  Wt. (Lbs) 235 221 223 225.4 218.12 229.12 221.08  BMI 37.95 35.69 36.01 36.4 35.22 37 35.7

## 2015-04-12 LAB — POC HEMOCCULT BLD/STL (OFFICE/1-CARD/DIAGNOSTIC): Fecal Occult Blood, POC: NEGATIVE

## 2015-04-23 ENCOUNTER — Encounter: Payer: Self-pay | Admitting: Family Medicine

## 2015-04-23 DIAGNOSIS — R079 Chest pain, unspecified: Secondary | ICD-10-CM | POA: Insufficient documentation

## 2015-04-23 DIAGNOSIS — R9431 Abnormal electrocardiogram [ECG] [EKG]: Secondary | ICD-10-CM | POA: Insufficient documentation

## 2015-04-23 NOTE — Assessment & Plan Note (Signed)
Deteriorated. Patient re-educated about  the importance of commitment to a  minimum of 150 minutes of exercise per week.  The importance of healthy food choices with portion control discussed. Encouraged to start a food diary, count calories and to consider  joining a support group. Sample diet sheets offered. Goals set by the patient for the next several months.   Weight /BMI 04/11/2015 11/15/2014 06/01/2014  WEIGHT 235 lb 221 lb 223 lb  HEIGHT 5\' 6"  5\' 6"  5\' 6"   BMI 37.95 kg/m2 35.69 kg/m2 36.01 kg/m2    Current exercise per week 30 minutes.

## 2015-04-23 NOTE — Assessment & Plan Note (Signed)
Intermittent chest pain, fatigue and poor exercise tolerance.  EKG shows non specific T wave abnormality  Risk factors for CVD are ongoing nicotine use, hypertension, refer cardiology

## 2015-04-23 NOTE — Assessment & Plan Note (Signed)
Patient counseled for approximately 5 minutes regarding the health risks of ongoing nicotine use, specifically all types of cancer, heart disease, stroke and respiratory failure. The options available for help with cessation ,the behavioral changes to assist the process, and the option to either gradully reduce usage  Or abruptly stop.is also discussed. Pt is also encouraged to set specific goals in number of cigarettes used daily, as well as to set a quit date.  Still using e cigarettes , counseled to quit

## 2015-04-23 NOTE — Assessment & Plan Note (Signed)
Short course of anti inflammatories prescribed 

## 2015-04-23 NOTE — Assessment & Plan Note (Signed)

## 2015-05-21 ENCOUNTER — Ambulatory Visit: Payer: BLUE CROSS/BLUE SHIELD

## 2015-05-21 ENCOUNTER — Ambulatory Visit (INDEPENDENT_AMBULATORY_CARE_PROVIDER_SITE_OTHER): Payer: BLUE CROSS/BLUE SHIELD | Admitting: Cardiovascular Disease

## 2015-05-21 ENCOUNTER — Encounter: Payer: Self-pay | Admitting: Cardiovascular Disease

## 2015-05-21 VITALS — BP 136/82

## 2015-05-21 VITALS — BP 140/78 | HR 84 | Ht 66.0 in | Wt 235.0 lb

## 2015-05-21 DIAGNOSIS — R0609 Other forms of dyspnea: Secondary | ICD-10-CM

## 2015-05-21 DIAGNOSIS — I10 Essential (primary) hypertension: Secondary | ICD-10-CM | POA: Diagnosis not present

## 2015-05-21 DIAGNOSIS — R5383 Other fatigue: Secondary | ICD-10-CM

## 2015-05-21 DIAGNOSIS — R9431 Abnormal electrocardiogram [ECG] [EKG]: Secondary | ICD-10-CM | POA: Diagnosis not present

## 2015-05-21 DIAGNOSIS — R0600 Dyspnea, unspecified: Secondary | ICD-10-CM

## 2015-05-21 LAB — CBC
HCT: 41 % (ref 39.0–52.0)
Hemoglobin: 12.5 g/dL — ABNORMAL LOW (ref 13.0–17.0)
MCH: 26 pg (ref 26.0–34.0)
MCHC: 30.5 g/dL (ref 30.0–36.0)
MCV: 85.2 fL (ref 78.0–100.0)
MPV: 9.8 fL (ref 8.6–12.4)
Platelets: 287 10*3/uL (ref 150–400)
RBC: 4.81 MIL/uL (ref 4.22–5.81)
RDW: 16.1 % — ABNORMAL HIGH (ref 11.5–15.5)
WBC: 5.9 10*3/uL (ref 4.0–10.5)

## 2015-05-21 NOTE — Progress Notes (Signed)
Advised to continue current dose of medication and keep next follow up appt

## 2015-05-21 NOTE — Progress Notes (Signed)
Patient ID: Todd Randall, male   DOB: 12-Oct-1955, 60 y.o.   MRN: [id number]      SUBJECTIVE: The patient is a 60 year old male with a history of COPD, hypertension, and tobacco abuse. He has been experiencing diminished exercise tolerance. He underwent an ECG by his PCP which demonstrated sinus rhythm with PACs and a nonspecific T wave abnormality.  He has been experiencing progressive exertional dyspnea and fatigue over the past 6 months. He has primarily noticed it when taking the trash cans from the driveway to his house which is about 40 yards. He denies exertional chest pain and tightness. He also denies orthopnea, leg swelling, palpitations, and paroxysmal nocturnal dyspnea, as well as syncope. He quit smoking tobacco approximately 4 years ago and has been vaping.  He underwent right hip replacement surgery approximately 13 years ago and has been having problems with it recently. He does not know if he would be able to walk on a treadmill.   Review of Systems: As per "subjective", otherwise negative.  Allergies  Allergen Reactions  . Lisinopril Cough    Current Outpatient Prescriptions  Medication Sig Dispense Refill  . albuterol (PROVENTIL HFA;VENTOLIN HFA) 108 (90 BASE) MCG/ACT inhaler Inhale 2 puffs into the lungs every 6 (six) hours as needed for wheezing.    Marland Kitchen amLODipine (NORVASC) 10 MG tablet TAKE ONE TABLET BY MOUTH ONCE DAILY 30 tablet 2  . aspirin 81 MG tablet Take 81 mg by mouth daily.    . cholecalciferol (VITAMIN D) 400 UNITS TABS tablet Take 1,000 Units by mouth.    . cloNIDine (CATAPRES) 0.1 MG tablet TAKE ONE TABLET BY MOUTH ONCE DAILY AT BEDTIME **REDUCED  DOSE** 60 tablet 2  . fish oil-omega-3 fatty acids 1000 MG capsule Take 2 g by mouth 2 (two) times daily before a meal.    . Fluticasone-Salmeterol (ADVAIR DISKUS) 250-50 MCG/DOSE AEPB Inhale 1 puff into the lungs 2 (two) times daily. 60 each 3  . magnesium 30 MG tablet Take 30 mg by mouth 2 (two) times daily.    .  Multiple Vitamin (MULTIVITAMIN) tablet Take 1 tablet by mouth daily.    Marland Kitchen spironolactone (ALDACTONE) 25 MG tablet Take 1 tablet (25 mg total) by mouth daily. 30 tablet 5  . tiotropium (SPIRIVA) 18 MCG inhalation capsule Place 1 capsule (18 mcg total) into inhaler and inhale daily. 30 capsule 3  . vitamin B-12 (CYANOCOBALAMIN) 1000 MCG tablet Take 1,000 mcg by mouth daily.    Marland Kitchen zinc gluconate 50 MG tablet Take 50 mg by mouth daily.     No current facility-administered medications for this visit.    Past Medical History  Diagnosis Date  . Hypertension   . Hyperlipidemia   . COPD (chronic obstructive pulmonary disease) [facility])     Past Surgical History  Procedure Laterality Date  . 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  . Colonoscopy N/A 11/10/2012    Procedure: COLONOSCOPY;  Surgeon: Rogene Houston, MD;  Location: [facility];  Service: Endoscopy;  Laterality: N/A;  205-moved to 120 Ann to notify pt    Social History   Social History  . Marital Status: Married    Spouse Name: N/A  . Number of Children: N/A  . Years of Education: N/A   Occupational History  . Not on file.   Social History Main Topics  . Smoking status: Current Every Day Smoker    Types: E-cigarettes  . Smokeless  tobacco: Not on file     Comment: quit 2 yrs ago. He is using the E-cigarettes  . Alcohol Use: No  . Drug Use: No  . Sexual Activity: Yes   Other Topics Concern  . Not on file   Social History Narrative     Filed Vitals:   05/21/15 0910  BP: 140/78  Pulse: 84  Height: 5\' 6"  (1.676 m)  Weight: 235 lb (106.595 kg)  SpO2: 95%    PHYSICAL EXAM General: NAD HEENT: Normal. Neck: No JVD, no thyromegaly. Lungs: Diminished throughout, no rales/wheezes. CV: Nondisplaced PMI.  Regular rate and rhythm, normal S1/S2, no S3/S4, no murmur. No pretibial or periankle edema.  No carotid bruit.   Abdomen: Soft, nontender, obese. Neurologic: Alert and oriented.  Psych:  Normal affect. Skin: Normal. Musculoskeletal: No gross deformities.  ECG: Most recent ECG reviewed.      ASSESSMENT AND PLAN: 1. Exertional dyspnea, fatigue, and abnormal ECG: Has several cardiovascular risk factors. Due to right hip pain he is uncertain if he would be able to walk on a treadmill. I will obtain a Lexiscan Cardiolite stress test to evaluate for ischemic heart disease.  2. Essential HTN: Borderline SBP elevation today. Will monitor.  Dispo: f/u 4-6 weeks.   Kate Sable, M.D., F.A.C.C.

## 2015-05-21 NOTE — Patient Instructions (Signed)
Your physician recommends that you schedule a follow-up appointment in: 4-6 weeks   Your physician recommends that you continue on your current medications as directed. Please refer to the Current Medication list given to you today.   Your physician has requested that you have a lexiscan myoview. For further information please visit [email or web address]. Please follow instruction sheet, as given.   If you need a refill on your cardiac medications before your next appointment, please call your pharmacy.   Thank you for choosing [facility] !

## 2015-05-22 ENCOUNTER — Other Ambulatory Visit: Payer: Self-pay | Admitting: Family Medicine

## 2015-05-22 LAB — LIPID PANEL
Cholesterol: 216 mg/dL — ABNORMAL HIGH (ref 125–200)
HDL: 36 mg/dL — ABNORMAL LOW (ref >=40)
LDL Cholesterol: 143 mg/dL — ABNORMAL HIGH (ref <130)
Total CHOL/HDL Ratio: 6 Ratio — ABNORMAL HIGH (ref <=5.0)
Triglycerides: 186 mg/dL — ABNORMAL HIGH (ref <150)
VLDL: 37 mg/dL — ABNORMAL HIGH (ref <30)

## 2015-05-22 LAB — COMPREHENSIVE METABOLIC PANEL
ALT: 19 U/L (ref 9–46)
AST: 21 U/L (ref 10–35)
Albumin: 4.4 g/dL (ref 3.6–5.1)
Alkaline Phosphatase: 67 U/L (ref 40–115)
BUN: 16 mg/dL (ref 7–25)
CO2: 32 mmol/L — ABNORMAL HIGH (ref 20–31)
Calcium: 9.6 mg/dL (ref 8.6–10.3)
Chloride: 100 mmol/L (ref 98–110)
Creat: 0.86 mg/dL (ref 0.70–1.33)
Glucose, Bld: 99 mg/dL (ref 65–99)
Potassium: 4.7 mmol/L (ref 3.5–5.3)
Sodium: 144 mmol/L (ref 135–146)
Total Bilirubin: 0.4 mg/dL (ref 0.2–1.2)
Total Protein: 7.3 g/dL (ref 6.1–8.1)

## 2015-05-22 LAB — URIC ACID: Uric Acid, Serum: 7.7 mg/dL (ref 4.0–7.8)

## 2015-05-22 LAB — PSA: PSA: 2.62 ng/mL (ref <=4.00)

## 2015-05-22 LAB — HEPATITIS C ANTIBODY: HCV Ab: NEGATIVE

## 2015-05-22 LAB — TSH: TSH: 0.87 mIU/L (ref 0.40–4.50)

## 2015-05-28 ENCOUNTER — Other Ambulatory Visit: Payer: Self-pay

## 2015-05-28 MED ORDER — PRAVASTATIN SODIUM 20 MG PO TABS: 20.0000 mg | ORAL_TABLET | Freq: Every day | ORAL | Status: DC

## 2015-05-28 MED ORDER — ALLOPURINOL 300 MG PO TABS: 300.0000 mg | ORAL_TABLET | Freq: Every day | ORAL | Status: DC

## 2015-05-28 NOTE — Addendum Note (Signed)
Addended by: Denman George B on: 05/28/2015 11:12 AM   Modules accepted: Orders

## 2015-05-30 ENCOUNTER — Encounter (HOSPITAL_COMMUNITY)
Admission: RE | Admit: 2015-05-30 | Discharge: 2015-05-30 | Disposition: A | Discharge status: Home / Self Care | Payer: BLUE CROSS/BLUE SHIELD | Source: Ambulatory Visit | Attending: Cardiovascular Disease | Admitting: Cardiovascular Disease

## 2015-05-30 ENCOUNTER — Inpatient Hospital Stay (HOSPITAL_COMMUNITY): Admission: RE | Admit: 2015-05-30 | Payer: BLUE CROSS/BLUE SHIELD | Source: Ambulatory Visit

## 2015-05-30 ENCOUNTER — Encounter (HOSPITAL_COMMUNITY): Payer: Self-pay

## 2015-05-30 DIAGNOSIS — R0609 Other forms of dyspnea: Secondary | ICD-10-CM | POA: Diagnosis not present

## 2015-05-30 DIAGNOSIS — R0600 Dyspnea, unspecified: Secondary | ICD-10-CM

## 2015-05-30 DIAGNOSIS — R9431 Abnormal electrocardiogram [ECG] [EKG]: Secondary | ICD-10-CM | POA: Diagnosis not present

## 2015-05-30 LAB — NM MYOCAR MULTI W/SPECT W/WALL MOTION / EF
LV dias vol: 100 mL (ref 62–150)
LV sys vol: 36 mL
Peak HR: 100 {beats}/min
RATE: 0.25
Rest HR: 82 {beats}/min
SDS: 1
SRS: 3
SSS: 4
TID: 1.15

## 2015-05-30 MED ORDER — TECHNETIUM TC 99M SESTAMIBI GENERIC - CARDIOLITE
10.0000 | Freq: Once | INTRAVENOUS | Status: AC | PRN
  Administered 2015-05-30: 10.54 via INTRAVENOUS

## 2015-05-30 MED ORDER — REGADENOSON 0.4 MG/5ML IV SOLN
INTRAVENOUS | Status: AC
  Administered 2015-05-30: 0.4 mg via INTRAVENOUS
  Filled 2015-05-30: qty 5

## 2015-05-30 MED ORDER — SODIUM CHLORIDE 0.9% FLUSH
INTRAVENOUS | Status: AC
  Administered 2015-05-30: 10 mL via INTRAVENOUS
  Filled 2015-05-30: qty 10

## 2015-05-30 MED ORDER — TECHNETIUM TC 99M SESTAMIBI - CARDIOLITE
30.0000 | Freq: Once | INTRAVENOUS | Status: AC | PRN
  Administered 2015-05-30: 32 via INTRAVENOUS

## 2015-06-18 ENCOUNTER — Other Ambulatory Visit: Payer: Self-pay | Admitting: Family Medicine

## 2015-06-19 ENCOUNTER — Other Ambulatory Visit: Payer: Self-pay

## 2015-06-19 MED ORDER — AMLODIPINE BESYLATE 10 MG PO TABS: 10.0000 mg | ORAL_TABLET | Freq: Every day | ORAL | Status: DC

## 2015-06-19 MED ORDER — CLONIDINE HCL 0.1 MG PO TABS: ORAL_TABLET | ORAL | Status: DC

## 2015-06-25 ENCOUNTER — Other Ambulatory Visit: Payer: Self-pay | Admitting: Family Medicine

## 2015-06-27 ENCOUNTER — Encounter: Payer: Self-pay | Admitting: Family Medicine

## 2015-06-28 ENCOUNTER — Other Ambulatory Visit: Payer: Self-pay

## 2015-06-28 DIAGNOSIS — J4530 Mild persistent asthma, uncomplicated: Secondary | ICD-10-CM

## 2015-06-28 MED ORDER — FLUTICASONE-SALMETEROL 250-50 MCG/DOSE IN AEPB: 1.0000 | INHALATION_SPRAY | Freq: Two times a day (BID) | RESPIRATORY_TRACT | Status: DC

## 2015-07-10 ENCOUNTER — Ambulatory Visit (INDEPENDENT_AMBULATORY_CARE_PROVIDER_SITE_OTHER): Payer: BLUE CROSS/BLUE SHIELD | Admitting: Cardiovascular Disease

## 2015-07-10 ENCOUNTER — Encounter: Payer: Self-pay | Admitting: Cardiovascular Disease

## 2015-07-10 VITALS — BP 138/84 | HR 99 | Ht 66.5 in | Wt 234.8 lb

## 2015-07-10 DIAGNOSIS — I10 Essential (primary) hypertension: Secondary | ICD-10-CM

## 2015-07-10 DIAGNOSIS — R0609 Other forms of dyspnea: Secondary | ICD-10-CM | POA: Diagnosis not present

## 2015-07-10 DIAGNOSIS — R5383 Other fatigue: Secondary | ICD-10-CM | POA: Diagnosis not present

## 2015-07-10 DIAGNOSIS — R9431 Abnormal electrocardiogram [ECG] [EKG]: Secondary | ICD-10-CM | POA: Diagnosis not present

## 2015-07-10 DIAGNOSIS — R0600 Dyspnea, unspecified: Secondary | ICD-10-CM

## 2015-07-10 NOTE — Progress Notes (Signed)
Patient ID: MARTICE HELLUMS, male   DOB: [birth year], 60 y.o.   MRN: [id number]      SUBJECTIVE: The patient returns for follow-up after undergoing cardiovascular testing performed for the evaluation of exertional dyspnea and fatigue. Nuclear MPI study 05/30/15 low risk with soft tissue attenuation artifact, EF 64%. He thinks his symptoms may be related to COPD.  Review of Systems: As per "subjective", otherwise negative.  Allergies  Allergen Reactions  . Lisinopril Cough    Current Outpatient Prescriptions  Medication Sig Dispense Refill  . albuterol (PROVENTIL HFA;VENTOLIN HFA) 108 (90 BASE) MCG/ACT inhaler Inhale 2 puffs into the lungs every 6 (six) hours as needed for wheezing.    Marland Kitchen allopurinol (ZYLOPRIM) 300 MG tablet Take 1 tablet (300 mg total) by mouth daily. 30 tablet 6  . amLODipine (NORVASC) 10 MG tablet Take 1 tablet (10 mg total) by mouth daily. 30 tablet 2  . aspirin 81 MG tablet Take 81 mg by mouth daily.    . cholecalciferol (VITAMIN D) 400 UNITS TABS tablet Take 1,000 Units by mouth.    . cloNIDine (CATAPRES) 0.1 MG tablet TAKE ONE TABLET BY MOUTH ONCE DAILY AT BEDTIME **REDUCED  DOSE** 60 tablet 2  . fish oil-omega-3 fatty acids 1000 MG capsule Take 2 g by mouth 2 (two) times daily before a meal.    . Fluticasone-Salmeterol (ADVAIR DISKUS) 250-50 MCG/DOSE AEPB Inhale 1 puff into the lungs 2 (two) times daily. 60 each 2  . magnesium 30 MG tablet Take 30 mg by mouth 2 (two) times daily.    . Multiple Vitamin (MULTIVITAMIN) tablet Take 1 tablet by mouth daily.    . pravastatin (PRAVACHOL) 20 MG tablet Take 1 tablet (20 mg total) by mouth daily. 30 tablet 5  . spironolactone (ALDACTONE) 25 MG tablet Take 1 tablet (25 mg total) by mouth daily. 30 tablet 5  . tiotropium (SPIRIVA) 18 MCG inhalation capsule Place 1 capsule (18 mcg total) into inhaler and inhale daily. 30 capsule 3  . vitamin B-12 (CYANOCOBALAMIN) 1000 MCG tablet Take 1,000 mcg by mouth daily.    Marland Kitchen zinc gluconate  50 MG tablet Take 50 mg by mouth daily.     No current facility-administered medications for this visit.    Past Medical History  Diagnosis Date  . Hypertension   . Hyperlipidemia   . COPD (chronic obstructive pulmonary disease) [facility])     Past Surgical History  Procedure Laterality Date  . 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  . Colonoscopy N/A 11/10/2012    Procedure: COLONOSCOPY;  Surgeon: Rogene Houston, MD;  Location: [facility];  Service: Endoscopy;  Laterality: N/A;  205-moved to 120 Ann to notify pt    Social History   Social History  . Marital Status: Married    Spouse Name: N/A  . Number of Children: N/A  . Years of Education: N/A   Occupational History  . Not on file.   Social History Main Topics  . Smoking status: Current Every Day Smoker    Types: E-cigarettes  . Smokeless tobacco: Not on file     Comment: quit 2 yrs ago. He is using the E-cigarettes  . Alcohol Use: No  . Drug Use: No  . Sexual Activity: Yes   Other Topics Concern  . Not on file   Social History Narrative     Filed Vitals:   07/10/15 0820  BP: 138/84  Pulse: 99  Height: 5' 6.5" (1.689 m)  Weight: 234 lb 12.8 oz (106.505 kg)  SpO2: 93%    PHYSICAL EXAM General: NAD HEENT: Normal. Neck: No JVD, no thyromegaly. Lungs: Diminished throughout, no rales/wheezes. CV: Nondisplaced PMI. Regular rate and rhythm, normal S1/S2, no S3/S4, no murmur. No pretibial or periankle edema. No carotid bruit.  Abdomen: Soft, nontender, obese. Neurologic: Alert and oriented.  Psych: Normal affect. Skin: Normal. Musculoskeletal: No gross deformities.  ECG: Most recent ECG reviewed.      ASSESSMENT AND PLAN: 1. Exertional dyspnea, fatigue, and abnormal ECG: Lexiscan Cardiolite stress test low risk, EF 64%. Symptoms possibly due to COPD. No further testing indicated.  2. Essential HTN: Controlled. No changes.  Dispo: f/u prn.  Kate Sable,  M.D., F.A.C.C.

## 2015-07-10 NOTE — Patient Instructions (Signed)
Your physician recommends that you schedule a follow-up appointment in: as needed   Thank you for choosing [facility] !         

## 2015-08-08 ENCOUNTER — Ambulatory Visit (INDEPENDENT_AMBULATORY_CARE_PROVIDER_SITE_OTHER): Payer: BLUE CROSS/BLUE SHIELD | Admitting: Family Medicine

## 2015-08-08 ENCOUNTER — Encounter: Payer: Self-pay | Admitting: Family Medicine

## 2015-08-08 VITALS — BP 124/80 | HR 89 | Resp 16 | Ht 67.0 in | Wt 236.0 lb

## 2015-08-08 DIAGNOSIS — M545 Low back pain: Secondary | ICD-10-CM

## 2015-08-08 DIAGNOSIS — Z789 Other specified health status: Secondary | ICD-10-CM

## 2015-08-08 DIAGNOSIS — R7302 Impaired glucose tolerance (oral): Secondary | ICD-10-CM | POA: Diagnosis not present

## 2015-08-08 DIAGNOSIS — M5430 Sciatica, unspecified side: Secondary | ICD-10-CM | POA: Diagnosis not present

## 2015-08-08 DIAGNOSIS — M5440 Lumbago with sciatica, unspecified side: Secondary | ICD-10-CM | POA: Insufficient documentation

## 2015-08-08 DIAGNOSIS — I10 Essential (primary) hypertension: Secondary | ICD-10-CM | POA: Diagnosis not present

## 2015-08-08 DIAGNOSIS — Z720 Tobacco use: Secondary | ICD-10-CM

## 2015-08-08 DIAGNOSIS — E669 Obesity, unspecified: Secondary | ICD-10-CM

## 2015-08-08 DIAGNOSIS — E66811 Obesity, class 1: Secondary | ICD-10-CM

## 2015-08-08 DIAGNOSIS — E785 Hyperlipidemia, unspecified: Secondary | ICD-10-CM

## 2015-08-08 MED ORDER — PREDNISONE 5 MG (21) PO TBPK: 5.0000 mg | ORAL_TABLET | ORAL | Status: DC

## 2015-08-08 MED ORDER — METHYLPREDNISOLONE ACETATE 80 MG/ML IJ SUSP
80.0000 mg | Freq: Once | INTRAMUSCULAR | Status: AC
  Administered 2015-08-08: 80 mg via INTRAMUSCULAR

## 2015-08-08 MED ORDER — IBUPROFEN 800 MG PO TABS
800.0000 mg | ORAL_TABLET | Freq: Three times a day (TID) | ORAL | Status: DC
Start: 2015-08-08 — End: 2016-05-26

## 2015-08-08 MED ORDER — KETOROLAC TROMETHAMINE 60 MG/2ML IM SOLN
60.0000 mg | Freq: Once | INTRAMUSCULAR | Status: AC
  Administered 2015-08-08: 60 mg via INTRAMUSCULAR

## 2015-08-08 NOTE — Patient Instructions (Addendum)
F/u in 5 month, call if you need me before  Injections today for back pain and medication sent in   PLS stop inhaling products   BP is good  Fasting lipid, cmp , and HBA1C in 5 month  Back Exercises If you have pain in your back, do these exercises 2-3 times each day or as told by your doctor. When the pain goes away, do the exercises once each day, but repeat the steps more times for each exercise (do more repetitions). If you do not have pain in your back, do these exercises once each day or as told by your doctor. EXERCISES Single Knee to Chest Do these steps 3-5 times in a row for each leg: 1. Lie on your back on a firm bed or the floor with your legs stretched out. 2. Bring one knee to your chest. 3. Hold your knee to your chest by grabbing your knee or thigh. 4. Pull on your knee until you feel a gentle stretch in your lower back. 5. Keep doing the stretch for 10-30 seconds. 6. Slowly let go of your leg and straighten it. Pelvic Tilt Do these steps 5-10 times in a row: 1. Lie on your back on a firm bed or the floor with your legs stretched out. 2. Bend your knees so they point up to the ceiling. Your feet should be flat on the floor. 3. Tighten your lower belly (abdomen) muscles to press your lower back against the floor. This will make your tailbone point up to the ceiling instead of pointing down to your feet or the floor. 4. Stay in this position for 5-10 seconds while you gently tighten your muscles and breathe evenly. Cat-Cow Do these steps until your lower back bends more easily: 1. Get on your hands and knees on a firm surface. Keep your hands under your shoulders, and keep your knees under your hips. You may put padding under your knees. 2. Let your head hang down, and make your tailbone point down to the floor so your lower back is round like the back of a cat. 3. Stay in this position for 5 seconds. 4. Slowly lift your head and make your tailbone point up to the  ceiling so your back hangs low (sags) like the back of a cow. 5. Stay in this position for 5 seconds. Press-Ups Do these steps 5-10 times in a row: 1. Lie on your belly (face-down) on the floor. 2. Place your hands near your head, about shoulder-width apart. 3. While you keep your back relaxed and keep your hips on the floor, slowly straighten your arms to raise the top half of your body and lift your shoulders. Do not use your back muscles. To make yourself more comfortable, you may change where you place your hands. 4. Stay in this position for 5 seconds. 5. Slowly return to lying flat on the floor. Bridges Do these steps 10 times in a row: 1. Lie on your back on a firm surface. 2. Bend your knees so they point up to the ceiling. Your feet should be flat on the floor. 3. Tighten your butt muscles and lift your butt off of the floor until your waist is almost as high as your knees. If you do not feel the muscles working in your butt and the back of your thighs, slide your feet 1-2 inches farther away from your butt. 4. Stay in this position for 3-5 seconds. 5. Slowly lower your butt to the  floor, and let your butt muscles relax. If this exercise is too easy, try doing it with your arms crossed over your chest. Belly Crunches Do these steps 5-10 times in a row: 1. Lie on your back on a firm bed or the floor with your legs stretched out. 2. Bend your knees so they point up to the ceiling. Your feet should be flat on the floor. 3. Cross your arms over your chest. 4. Tip your chin a little bit toward your chest but do not bend your neck. 5. Tighten your belly muscles and slowly raise your chest just enough to lift your shoulder blades a tiny bit off of the floor. 6. Slowly lower your chest and your head to the floor. Back Lifts Do these steps 5-10 times in a row: 1. Lie on your belly (face-down) with your arms at your sides, and rest your forehead on the floor. 2. Tighten the muscles in your  legs and your butt. 3. Slowly lift your chest off of the floor while you keep your hips on the floor. Keep the back of your head in line with the curve in your back. Look at the floor while you do this. 4. Stay in this position for 3-5 seconds. 5. Slowly lower your chest and your face to the floor. GET HELP IF:  Your back pain gets a lot worse when you do an exercise.  Your back pain does not lessen 2 hours after you exercise. If you have any of these problems, stop doing the exercises. Do not do them again unless your doctor says it is okay. GET HELP RIGHT AWAY IF:  You have sudden, very bad back pain. If this happens, stop doing the exercises. Do not do them again unless your doctor says it is okay.   This information is not intended to replace advice given to you by your health care provider. Make sure you discuss any questions you have with your health care provider.   Document Released: 04/05/2010 Document Revised: 11/22/2014 Document Reviewed: 04/27/2014 Elsevier Interactive Patient Education [employer].

## 2015-08-08 NOTE — Progress Notes (Signed)
Subjective:    Patient ID: Todd Randall, male    DOB: 12/26/1955, 60 y.o.   MRN: [id number]  HPI   Todd Randall     MRN: [id number]      DOB: Aug 11, 1955   HPI Todd Randall is here for follow up and re-evaluation of chronic medical conditions, medication management and review of any available recent lab and radiology data.  Preventive health is updated, specifically  Cancer screening and Immunization.   Questions or concerns regarding consultations or procedures which the PT has had in the interim are  Addressed.Had full cardiology eval which was normal The PT denies any adverse reactions to current medications since the last visit.  C/o increased back and lower extremity pain x 2 weeks   ROS Denies recent fever or chills. Denies sinus pressure, nasal congestion, ear pain or sore throat. Denies chest congestion, productive cough or wheezing. Denies chest pains, palpitations and leg swelling Denies abdominal pain, nausea, vomiting,diarrhea or constipation.   Denies dysuria, frequency, hesitancy or incontinence Denies headaches, seizures, numbness, or tingling. Denies depression, anxiety or insomnia. Denies skin break down or rash.   PE  BP 124/80 mmHg  Pulse 89  Resp 16  Ht 5\' 7"  (1.702 m)  Wt 236 lb (107.049 kg)  BMI 36.95 kg/m2  SpO2 93%  Patient alert and oriented and in no cardiopulmonary distress.  HEENT: No facial asymmetry, EOMI,   oropharynx pink and moist.  Neck supple no JVD, no mass.  Chest: Clear to auscultation bilaterally.  CVS: S1, S2 no murmurs, no S3.Regular rate.  ABD: Soft non tender.   Ext: No edema  MS: Adequate  though reduced  ROM spine, normal in shoulders, hips and knees.  Skin: Intact, no ulcerations or rash noted.  Psych: Good eye contact, normal affect. Memory intact not anxious or depressed appearing.  CNS: CN 2-12 intact, power,  normal throughout.no focal deficits noted.   Assessment & Plan   Back pain of lumbar region with  sciatica Uncontrolled.Toradol and depo medrol administered IM in the office , to be followed by a short course of oral prednisone and NSAIDS.   Essential hypertension Controlled, no change in medication DASH diet and commitment to daily physical activity for a minimum of 30 minutes discussed and encouraged, as a part of hypertension management. The importance of attaining a healthy weight is also discussed.  BP/Weight 08/08/2015 07/10/2015 05/21/2015 05/21/2015 04/11/2015 11/15/2014 123XX123  Systolic BP [id number] [id number]  Diastolic BP 80 84 82 78 80 80 70  Wt. (Lbs) 236 234.8 - 235 235 221 223  BMI 36.95 37.33 - 37.95 37.95 35.69 36.01        COPD with emphysema Stable, needs to stop inhaling products, counseled in this regard  Nicotine vapor product user Patient counseled for approximately 5 minutes regarding the health risks of ongoing nicotine use, specifically all types of cancer, heart disease, stroke and respiratory failure. The options available for help with cessation ,the behavioral changes to assist the process, and the option to either gradully reduce usage  Or abruptly stop.is also discussed. Pt is also encouraged to set specific goals in number of vapes used daily, needs to quit    Obesity (BMI 30.0-34.9) Deteriorated. Patient re-educated about  the importance of commitment to a  minimum of 150 minutes of exercise per week.  The importance of healthy food choices with portion control discussed. Encouraged to start a food diary, count calories and to consider  joining a support group. Sample diet sheets offered. Goals set by the patient for the next several months.   Weight /BMI 08/08/2015 07/10/2015 05/21/2015  WEIGHT 236 lb 234 lb 12.8 oz 235 lb  HEIGHT 5\' 7"  5' 6.5" 5\' 6"   BMI 36.95 kg/m2 37.33 kg/m2 37.95 kg/m2    Current exercise per week 60 minutes.   Hyperlipidemia LDL goal <130 Uncontrolled Hyperlipidemia:Low fat diet discussed and  encouraged.   Lipid Panel  Lab Results  Component Value Date   CHOL 216* 05/21/2015   HDL 36* 05/21/2015   LDLCALC 143* 05/21/2015   TRIG 186* 05/21/2015   CHOLHDL 6.0* 05/21/2015  Updated lab needed at/ before next visit.            Review of Systems     Objective:   Physical Exam        Assessment & Plan:

## 2015-08-08 NOTE — Assessment & Plan Note (Signed)
Uncontrolled.Toradol and depo medrol administered IM in the office , to be followed by a short course of oral prednisone and NSAIDS.  

## 2015-08-13 DIAGNOSIS — E785 Hyperlipidemia, unspecified: Secondary | ICD-10-CM | POA: Insufficient documentation

## 2015-08-13 NOTE — Assessment & Plan Note (Signed)
Deteriorated. Patient re-educated about  the importance of commitment to a  minimum of 150 minutes of exercise per week.  The importance of healthy food choices with portion control discussed. Encouraged to start a food diary, count calories and to consider  joining a support group. Sample diet sheets offered. Goals set by the patient for the next several months.   Weight /BMI 08/08/2015 07/10/2015 05/21/2015  WEIGHT 236 lb 234 lb 12.8 oz 235 lb  HEIGHT 5\' 7"  5' 6.5" 5\' 6"   BMI 36.95 kg/m2 37.33 kg/m2 37.95 kg/m2    Current exercise per week 60 minutes.

## 2015-08-13 NOTE — Assessment & Plan Note (Signed)
Patient counseled for approximately 5 minutes regarding the health risks of ongoing nicotine use, specifically all types of cancer, heart disease, stroke and respiratory failure. The options available for help with cessation ,the behavioral changes to assist the process, and the option to either gradully reduce usage  Or abruptly stop.is also discussed. Pt is also encouraged to set specific goals in number of vapes used daily, needs to quit

## 2015-08-13 NOTE — Assessment & Plan Note (Signed)
Stable, needs to stop inhaling products, counseled in this regard

## 2015-08-13 NOTE — Assessment & Plan Note (Signed)
Controlled, no change in medication DASH diet and commitment to daily physical activity for a minimum of 30 minutes discussed and encouraged, as a part of hypertension management. The importance of attaining a healthy weight is also discussed.  BP/Weight 08/08/2015 07/10/2015 05/21/2015 05/21/2015 04/11/2015 11/15/2014 [id number]  Systolic BP A999333 0000000 [id number]  Diastolic BP 80 84 82 78 80 80 70  Wt. (Lbs) 236 234.8 - 235 235 221 223  BMI 36.95 37.33 - 37.95 37.95 35.69 36.01

## 2015-08-13 NOTE — Assessment & Plan Note (Signed)
Uncontrolled Hyperlipidemia:Low fat diet discussed and encouraged.   Lipid Panel  Lab Results  Component Value Date   CHOL 216* 05/21/2015   HDL 36* 05/21/2015   LDLCALC 143* 05/21/2015   TRIG 186* 05/21/2015   CHOLHDL 6.0* 05/21/2015  Updated lab needed at/ before next visit.

## 2015-09-15 ENCOUNTER — Other Ambulatory Visit: Payer: Self-pay | Admitting: Family Medicine

## 2015-12-29 ENCOUNTER — Other Ambulatory Visit: Payer: Self-pay | Admitting: Family Medicine

## 2015-12-29 DIAGNOSIS — I10 Essential (primary) hypertension: Secondary | ICD-10-CM

## 2016-01-04 LAB — HEMOGLOBIN A1C
Hgb A1c MFr Bld: 4.7 % (ref <5.7)
Mean Plasma Glucose: 88 mg/dL

## 2016-01-05 LAB — COMPREHENSIVE METABOLIC PANEL
ALT: 14 U/L (ref 9–46)
AST: 18 U/L (ref 10–35)
Albumin: 4.5 g/dL (ref 3.6–5.1)
Alkaline Phosphatase: 55 U/L (ref 40–115)
BUN: 15 mg/dL (ref 7–25)
CO2: 27 mmol/L (ref 20–31)
Calcium: 9.7 mg/dL (ref 8.6–10.3)
Chloride: 101 mmol/L (ref 98–110)
Creat: 0.86 mg/dL (ref 0.70–1.33)
Glucose, Bld: 78 mg/dL (ref 65–99)
Potassium: 4.2 mmol/L (ref 3.5–5.3)
Sodium: 137 mmol/L (ref 135–146)
Total Bilirubin: 0.6 mg/dL (ref 0.2–1.2)
Total Protein: 7.1 g/dL (ref 6.1–8.1)

## 2016-01-05 LAB — LIPID PANEL
Cholesterol: 144 mg/dL (ref 125–200)
HDL: 38 mg/dL — ABNORMAL LOW (ref >=40)
LDL Cholesterol: 87 mg/dL (ref <130)
Total CHOL/HDL Ratio: 3.8 Ratio (ref <=5.0)
Triglycerides: 95 mg/dL (ref <150)
VLDL: 19 mg/dL (ref <30)

## 2016-01-14 ENCOUNTER — Other Ambulatory Visit: Payer: Self-pay | Admitting: Family Medicine

## 2016-01-14 DIAGNOSIS — J4530 Mild persistent asthma, uncomplicated: Secondary | ICD-10-CM

## 2016-01-16 ENCOUNTER — Other Ambulatory Visit: Payer: Self-pay | Admitting: Family Medicine

## 2016-01-17 ENCOUNTER — Encounter: Payer: Self-pay | Admitting: Family Medicine

## 2016-01-17 ENCOUNTER — Ambulatory Visit (INDEPENDENT_AMBULATORY_CARE_PROVIDER_SITE_OTHER): Payer: BLUE CROSS/BLUE SHIELD | Admitting: Family Medicine

## 2016-01-17 VITALS — BP 122/78 | HR 90 | Ht 67.0 in | Wt 199.0 lb

## 2016-01-17 DIAGNOSIS — J438 Other emphysema: Secondary | ICD-10-CM

## 2016-01-17 DIAGNOSIS — E66811 Obesity, class 1: Secondary | ICD-10-CM

## 2016-01-17 DIAGNOSIS — I10 Essential (primary) hypertension: Secondary | ICD-10-CM

## 2016-01-17 DIAGNOSIS — D509 Iron deficiency anemia, unspecified: Secondary | ICD-10-CM

## 2016-01-17 DIAGNOSIS — Z23 Encounter for immunization: Secondary | ICD-10-CM

## 2016-01-17 DIAGNOSIS — E559 Vitamin D deficiency, unspecified: Secondary | ICD-10-CM

## 2016-01-17 DIAGNOSIS — E785 Hyperlipidemia, unspecified: Secondary | ICD-10-CM

## 2016-01-17 DIAGNOSIS — Z125 Encounter for screening for malignant neoplasm of prostate: Secondary | ICD-10-CM

## 2016-01-17 DIAGNOSIS — E669 Obesity, unspecified: Secondary | ICD-10-CM

## 2016-01-17 DIAGNOSIS — E790 Hyperuricemia without signs of inflammatory arthritis and tophaceous disease: Secondary | ICD-10-CM

## 2016-01-17 DIAGNOSIS — J4530 Mild persistent asthma, uncomplicated: Secondary | ICD-10-CM | POA: Diagnosis not present

## 2016-01-17 MED ORDER — AMLODIPINE BESYLATE 10 MG PO TABS: 10.0000 mg | ORAL_TABLET | Freq: Every day | ORAL | 5 refills | Status: DC

## 2016-01-17 MED ORDER — FLUTICASONE-SALMETEROL 250-50 MCG/DOSE IN AEPB: 1.0000 | INHALATION_SPRAY | Freq: Two times a day (BID) | RESPIRATORY_TRACT | 1 refills | Status: DC

## 2016-01-17 NOTE — Progress Notes (Signed)
   Todd Randall     MRN: [id number]      DOB: Jan 04, 1956   HPI Todd Randall is here for follow up and re-evaluation of chronic medical conditions, medication management and review of any available recent lab and radiology data.  Preventive health is updated, specifically  Cancer screening and Immunization.   Questions or concerns regarding consultations or procedures which the PT has had in the interim are  addressed. The PT denies any adverse reactions to current medications since the last visit.  There are no new concerns.  There are no specific complaints   ROS Denies recent fever or chills. Denies sinus pressure, nasal congestion, ear pain or sore throat. Denies chest congestion, productive cough or wheezing. Denies chest pains, palpitations and leg swelling Denies abdominal pain, nausea, vomiting,diarrhea or constipation.   Denies dysuria, frequency, hesitancy or incontinence. Denies joint pain, swelling and limitation in mobility. Denies headaches, seizures, numbness, or tingling. Denies depression, anxiety or insomnia. Denies skin break down or rash.   PE  BP 122/78   Pulse 90   Ht 5\' 7"  (1.702 m)   Wt 199 lb (90.3 kg)   BMI 31.17 kg/m   Patient alert and oriented and in no cardiopulmonary distress.  HEENT: No facial asymmetry, EOMI,   oropharynx pink and moist.  Neck supple no JVD, no mass.  Chest: Clear to auscultation bilaterally.decreased though adequate air entry  CVS: S1, S2 no murmurs, no S3.Regular rate.  ABD: Soft non tender.   Ext: No edema  MS: Adequate ROM spine, shoulders, hips and knees.  Skin: Intact, no ulcerations or rash noted.  Psych: Good eye contact, normal affect. Memory intact not anxious or depressed appearing.  CNS: CN 2-12 intact, power,  normal throughout.no focal deficits noted.   Assessment & Plan  Essential hypertension Controlled, no change in medication DASH diet and commitment to daily physical activity for a minimum of 30  minutes discussed and encouraged, as a part of hypertension management. The importance of attaining a healthy weight is also discussed.  BP/Weight 01/17/2016 08/08/2015 07/10/2015 05/21/2015 05/21/2015 04/11/2015 [id number]  Systolic BP 123XX123 [id number] 0000000 [id number]  Diastolic BP 78 80 84 82 78 80 80  Wt. (Lbs) 199 236 234.8 - 235 235 221  BMI 31.17 36.95 37.33 - 37.95 37.95 35.69       COPD with emphysema Controlled, no change in medication   Hyperlipidemia LDL goal <130 Hyperlipidemia:Low fat diet discussed and encouraged.   Lipid Panel  Lab Results  Component Value Date   CHOL 144 01/04/2016   HDL 38 (L) 01/04/2016   LDLCALC 87 01/04/2016   TRIG 95 01/04/2016   CHOLHDL 3.8 01/04/2016   Needs to commit to regular exercise    Obesity (BMI 30.0-34.9) Markedly improved, pt applauded on thisPatient re-educated about  the importance of commitment to a  minimum of 150 minutes of exercise per week.  The importance of healthy food choices with portion control discussed. Encouraged to start a food diary, count calories and to consider  joining a support group. Sample diet sheets offered. Goals set by the patient for the next several months.   Weight /BMI 01/17/2016 08/08/2015 07/10/2015  WEIGHT 199 lb 236 lb 234 lb 12.8 oz  HEIGHT 5\' 7"  5\' 7"  5' 6.5"  BMI 31.17 kg/m2 36.95 kg/m2 37.33 kg/m2

## 2016-01-17 NOTE — Assessment & Plan Note (Signed)
Controlled, no change in medication DASH diet and commitment to daily physical activity for a minimum of 30 minutes discussed and encouraged, as a part of hypertension management. The importance of attaining a healthy weight is also discussed.  BP/Weight 01/17/2016 08/08/2015 07/10/2015 05/21/2015 05/21/2015 04/11/2015 [id number]  Systolic BP 123XX123 [id number] 0000000 [id number]  Diastolic BP 78 80 84 82 78 80 80  Wt. (Lbs) 199 236 234.8 - 235 235 221  BMI 31.17 36.95 37.33 - 37.95 37.95 35.69

## 2016-01-17 NOTE — Assessment & Plan Note (Signed)
Markedly improved, pt applauded on thisPatient re-educated about  the importance of commitment to a  minimum of 150 minutes of exercise per week.  The importance of healthy food choices with portion control discussed. Encouraged to start a food diary, count calories and to consider  joining a support group. Sample diet sheets offered. Goals set by the patient for the next several months.   Weight /BMI 01/17/2016 08/08/2015 07/10/2015  WEIGHT 199 lb 236 lb 234 lb 12.8 oz  HEIGHT 5\' 7"  5\' 7"  5' 6.5"  BMI 31.17 kg/m2 36.95 kg/m2 37.33 kg/m2

## 2016-01-17 NOTE — Patient Instructions (Signed)
Annual physical March 10 or after  Flu vaccine today  CONGRATS on successful weight loss, keep it up!  It is important that you exercise regularly at least 30 minutes 5 times a week. If you develop chest pain, have severe difficulty breathing, or feel very tired, stop exercising immediately and seek medical attention   CBC, cmp , PSA, TSH, uric acid and vit D non fast March 6 or after  Thank you  for choosing [facility]. We consider it a privelige to serve you.  Delivering excellent health care in a caring and  compassionate way is our goal.  Partnering with you,  so that together we can achieve this goal is our strategy.

## 2016-01-17 NOTE — Assessment & Plan Note (Signed)
Controlled, no change in medication  

## 2016-01-17 NOTE — Assessment & Plan Note (Signed)
Hyperlipidemia:Low fat diet discussed and encouraged.   Lipid Panel  Lab Results  Component Value Date   CHOL 144 01/04/2016   HDL 38 (L) 01/04/2016   LDLCALC 87 01/04/2016   TRIG 95 01/04/2016   CHOLHDL 3.8 01/04/2016   Needs to commit to regular exercise

## 2016-02-17 ENCOUNTER — Encounter: Payer: Self-pay | Admitting: Family Medicine

## 2016-02-18 ENCOUNTER — Other Ambulatory Visit: Payer: Self-pay

## 2016-02-18 MED ORDER — TIOTROPIUM BROMIDE MONOHYDRATE 18 MCG IN CAPS: 18.0000 ug | ORAL_CAPSULE | Freq: Every day | RESPIRATORY_TRACT | 3 refills | Status: DC

## 2016-02-29 ENCOUNTER — Other Ambulatory Visit: Payer: Self-pay | Admitting: Family Medicine

## 2016-04-09 ENCOUNTER — Other Ambulatory Visit: Payer: Self-pay | Admitting: Family Medicine

## 2016-05-21 LAB — CBC
HCT: 42.2 % (ref 38.5–50.0)
Hemoglobin: 13.3 g/dL (ref 13.2–17.1)
MCH: 27.5 pg (ref 27.0–33.0)
MCHC: 31.5 g/dL — ABNORMAL LOW (ref 32.0–36.0)
MCV: 87.2 fL (ref 80.0–100.0)
MPV: 9.6 fL (ref 7.5–12.5)
Platelets: 277 10*3/uL (ref 140–400)
RBC: 4.84 MIL/uL (ref 4.20–5.80)
RDW: 14.8 % (ref 11.0–15.0)
WBC: 5.1 10*3/uL (ref 3.8–10.8)

## 2016-05-21 LAB — COMPLETE METABOLIC PANEL WITHOUT GFR
ALT: 16 U/L (ref 9–46)
AST: 20 U/L (ref 10–35)
Albumin: 4.9 g/dL (ref 3.6–5.1)
Alkaline Phosphatase: 62 U/L (ref 40–115)
BUN: 17 mg/dL (ref 7–25)
CO2: 29 mmol/L (ref 20–31)
Calcium: 10 mg/dL (ref 8.6–10.3)
Chloride: 100 mmol/L (ref 98–110)
Creat: 0.82 mg/dL (ref 0.70–1.33)
GFR, Est African American: >89 mL/min
GFR, Est Non African American: >89 mL/min
Glucose, Bld: 84 mg/dL (ref 65–99)
Potassium: 4.4 mmol/L (ref 3.5–5.3)
Sodium: 140 mmol/L (ref 135–146)
Total Bilirubin: 0.5 mg/dL (ref 0.2–1.2)
Total Protein: 7.7 g/dL (ref 6.1–8.1)

## 2016-05-21 LAB — URIC ACID: Uric Acid, Serum: 4.4 mg/dL (ref 4.0–8.0)

## 2016-05-21 LAB — TSH: TSH: 1.1 m[IU]/L (ref 0.40–4.50)

## 2016-05-21 LAB — VITAMIN D 25 HYDROXY (VIT D DEFICIENCY, FRACTURES): Vit D, 25-Hydroxy: 50 ng/mL (ref 30–100)

## 2016-05-21 LAB — PSA: PSA: 3.7 ng/mL

## 2016-05-26 ENCOUNTER — Encounter: Payer: Self-pay | Admitting: Family Medicine

## 2016-05-26 ENCOUNTER — Ambulatory Visit (INDEPENDENT_AMBULATORY_CARE_PROVIDER_SITE_OTHER): Payer: BLUE CROSS/BLUE SHIELD | Admitting: Family Medicine

## 2016-05-26 VITALS — BP 124/80 | HR 86 | Ht 67.0 in | Wt 199.4 lb

## 2016-05-26 DIAGNOSIS — I10 Essential (primary) hypertension: Secondary | ICD-10-CM | POA: Diagnosis not present

## 2016-05-26 DIAGNOSIS — Z1211 Encounter for screening for malignant neoplasm of colon: Secondary | ICD-10-CM

## 2016-05-26 DIAGNOSIS — J4530 Mild persistent asthma, uncomplicated: Secondary | ICD-10-CM | POA: Diagnosis not present

## 2016-05-26 DIAGNOSIS — Z0000 Encounter for general adult medical examination without abnormal findings: Secondary | ICD-10-CM | POA: Diagnosis not present

## 2016-05-26 DIAGNOSIS — J438 Other emphysema: Secondary | ICD-10-CM

## 2016-05-26 DIAGNOSIS — Z789 Other specified health status: Secondary | ICD-10-CM | POA: Diagnosis not present

## 2016-05-26 DIAGNOSIS — E785 Hyperlipidemia, unspecified: Secondary | ICD-10-CM

## 2016-05-26 DIAGNOSIS — Z720 Tobacco use: Secondary | ICD-10-CM

## 2016-05-26 LAB — POC HEMOCCULT BLD/STL (OFFICE/1-CARD/DIAGNOSTIC): Fecal Occult Blood, POC: NEGATIVE

## 2016-05-26 MED ORDER — TIOTROPIUM BROMIDE MONOHYDRATE 18 MCG IN CAPS: 18.0000 ug | ORAL_CAPSULE | Freq: Every day | RESPIRATORY_TRACT | 3 refills | Status: DC

## 2016-05-26 MED ORDER — FLUTICASONE-SALMETEROL 250-50 MCG/DOSE IN AEPB: 1.0000 | INHALATION_SPRAY | Freq: Two times a day (BID) | RESPIRATORY_TRACT | 1 refills | Status: DC

## 2016-05-26 MED ORDER — BENZONATATE 100 MG PO CAPS: 100.0000 mg | ORAL_CAPSULE | Freq: Two times a day (BID) | ORAL | 0 refills | Status: DC | PRN

## 2016-05-26 MED ORDER — PRAVASTATIN SODIUM 10 MG PO TABS: 10.0000 mg | ORAL_TABLET | Freq: Every day | ORAL | 1 refills | Status: DC

## 2016-05-26 MED ORDER — PRAVASTATIN SODIUM 20 MG PO TABS: 20.0000 mg | ORAL_TABLET | Freq: Every day | ORAL | 1 refills | Status: DC

## 2016-05-26 NOTE — Progress Notes (Signed)
   Todd Randall     MRN: [id number]      DOB: 1955-08-09   HPI: Patient is in for annual physical exam. 5 day h/o tickle in throat and cough with congestion Recent labs,are reviewed. Immunization is reviewed , and  updated if needed.    PE; Pleasant male, alert and oriented x 3, in no cardio-pulmonary distress. Afebrile. HEENT No facial trauma or asymetry. Sinuses non tender. EOMI, pupils equally reactive to light. External ears normal, tympanic membranes clear. Oropharynx moist, no exudate. Neck: supple, no adenopathy,JVD or thyromegaly.No bruits.  Chest: Clear to ascultation bilaterally.No crackles or wheezes. Non tender to palpation  Breast: No asymetry,no masses. No nipple discharge or inversion. No axillary or supraclavicular adenopathy  Cardiovascular system; Heart sounds normal,  S1 and  S2 ,no S3.  No murmur, or thrill. Apical beat not displaced Peripheral pulses normal.  Abdomen: Soft, non tender, no organomegaly or masses. No bruits. Bowel sounds normal. No guarding, tenderness or rebound.  Rectal:  Normal sphincter tone. No hemorrhoids or  masses. guaiac negative stool. Prostate smooth and firm    Musculoskeletal exam: Full ROM of spine, hips , shoulders and knees. No deformity ,swelling or crepitus noted. No muscle wasting or atrophy.   Neurologic: Cranial nerves 2 to 12 intact. Power, tone ,sensation and reflexes normal throughout. No disturbance in gait. No tremor.  Skin: Intact, no ulceration, erythema , scaling or rash noted. Pigmentation normal throughout  Psych; Normal mood and affect. Judgement and concentration normal   Assessment & Plan:  Annual physical exam Annual exam as documented. Counseling done  re healthy lifestyle involving commitment to 150 minutes exercise per week, heart healthy diet, and attaining healthy weight.The importance of adequate sleep also discussed. Regular seat belt use and home safety, is also  discussed. Changes in health habits are decided on by the patient with goals and time frames  set for achieving them. Immunization and cancer screening needs are specifically addressed at this visit.   Nicotine vapor product user Patient counseled for approximately 5 minutes regarding the health risks of ongoing . The options available for help with cessation ,the behavioral changes to assist the process, and the option to either gradully reduce usage  Or abruptly stop.is also discussed. Pt is also encouraged to set specific goals in number of cigarettes used daily, as well as to set a quit date.     COPD with emphysema 5 day h/o chest congestion, tessalon perles prescribed

## 2016-05-26 NOTE — Assessment & Plan Note (Signed)
5 day h/o chest congestion, tessalon perles prescribed

## 2016-05-26 NOTE — Assessment & Plan Note (Signed)
Patient counseled for approximately 5 minutes regarding the health risks of ongoing . The options available for help with cessation ,the behavioral changes to assist the process, and the option to either gradully reduce usage  Or abruptly stop.is also discussed. Pt is also encouraged to set specific goals in number of cigarettes used daily, as well as to set a quit date.

## 2016-05-26 NOTE — Assessment & Plan Note (Signed)

## 2016-05-26 NOTE — Patient Instructions (Signed)
F/u in 5.5 month, call if you need em sooner  Congrats on excellent labs   Please work on quitting vaping, we are not sure that it ios 100% safe to vape!  It is important that you exercise regularly at least 30 minutes 5 times a week. If you develop chest pain, have severe difficulty breathing, or feel very tired, stop exercising immediately and seek medical attention   Fasting lipid, cmp and r eGFR in 5.5 month  Colonoscopy due in 2019  Reduced dose pravastatin to 10 mg   Decongestant perles sent in, in  The interim , youmay use robitussin dM or oTC decongestant. Call  If you develop fever , chills , green sputum, or feel ill in the next 10 days

## 2016-07-17 ENCOUNTER — Other Ambulatory Visit: Payer: Self-pay | Admitting: Family Medicine

## 2016-08-04 ENCOUNTER — Other Ambulatory Visit: Payer: Self-pay | Admitting: Family Medicine

## 2016-11-03 ENCOUNTER — Other Ambulatory Visit: Payer: Self-pay | Admitting: Family Medicine

## 2016-11-03 NOTE — Telephone Encounter (Signed)
Seen 05/26/16

## 2016-11-08 LAB — COMPLETE METABOLIC PANEL WITH GFR
ALT: 13 U/L (ref 9–46)
AST: 16 U/L (ref 10–35)
Albumin: 4.7 g/dL (ref 3.6–5.1)
Alkaline Phosphatase: 61 U/L (ref 40–115)
BUN: 17 mg/dL (ref 7–25)
CO2: 26 mmol/L (ref 20–32)
Calcium: 9.5 mg/dL (ref 8.6–10.3)
Chloride: 101 mmol/L (ref 98–110)
Creat: 0.87 mg/dL (ref 0.70–1.33)
GFR, Est African American: >89 mL/min (ref >=60)
GFR, Est Non African American: >89 mL/min (ref >=60)
Glucose, Bld: 84 mg/dL (ref 65–99)
Potassium: 4.2 mmol/L (ref 3.5–5.3)
Sodium: 137 mmol/L (ref 135–146)
Total Bilirubin: 0.5 mg/dL (ref 0.2–1.2)
Total Protein: 7.3 g/dL (ref 6.1–8.1)

## 2016-11-08 LAB — LIPID PANEL
Cholesterol: 155 mg/dL
HDL: 38 mg/dL — ABNORMAL LOW
LDL Cholesterol: 101 mg/dL — ABNORMAL HIGH
Total CHOL/HDL Ratio: 4.1 ratio
Triglycerides: 81 mg/dL
VLDL: 16 mg/dL

## 2016-11-11 ENCOUNTER — Encounter: Payer: Self-pay | Admitting: Family Medicine

## 2016-11-11 ENCOUNTER — Ambulatory Visit (INDEPENDENT_AMBULATORY_CARE_PROVIDER_SITE_OTHER): Payer: BLUE CROSS/BLUE SHIELD | Admitting: Family Medicine

## 2016-11-11 VITALS — BP 124/80 | HR 100 | Resp 16 | Ht 67.0 in | Wt 201.0 lb

## 2016-11-11 DIAGNOSIS — E669 Obesity, unspecified: Secondary | ICD-10-CM | POA: Diagnosis not present

## 2016-11-11 DIAGNOSIS — Z789 Other specified health status: Secondary | ICD-10-CM

## 2016-11-11 DIAGNOSIS — Z23 Encounter for immunization: Secondary | ICD-10-CM

## 2016-11-11 DIAGNOSIS — J438 Other emphysema: Secondary | ICD-10-CM | POA: Diagnosis not present

## 2016-11-11 DIAGNOSIS — I10 Essential (primary) hypertension: Secondary | ICD-10-CM | POA: Diagnosis not present

## 2016-11-11 DIAGNOSIS — Z720 Tobacco use: Secondary | ICD-10-CM

## 2016-11-11 NOTE — Patient Instructions (Addendum)
Physical exam March 13 or after , call if you need me sooner  .  It is important that you exercise regularly at least 30 minutes 5 times a week. If you develop chest pain, have severe difficulty breathing, or feel very tired, stop exercising immediately and seek medical attention   Please work on good  health habits so that your health will improve. 1. Commitment to daily physical activity for 30 to 60  minutes, if you are able to do this.  2. Commitment to wise food choices. Aim for half of your  food intake to be vegetable and fruit, one quarter starchy foods, and one quarter protein. Try to eat on a regular schedule  3 meals per day, snacking between meals should be limited to vegetables or fruits or small portions of nuts. 64 ounces of water per day is generally recommended, unless you have specific health conditions, like heart failure or kidney failure where you will need to limit fluid intake.  3. Commitment to sufficient and a  good quality of physical and mental rest daily, generally between 6 to 8 hours per day.  WITH PERSISTANCE AND PERSEVERANCE, THE IMPOSSIBLE , BECOMES THE NORM!  Excellent labs

## 2016-11-12 ENCOUNTER — Encounter: Payer: Self-pay | Admitting: Family Medicine

## 2016-11-12 NOTE — Assessment & Plan Note (Signed)
Controlled, no change in medication DASH diet and commitment to daily physical activity for a minimum of 30 minutes discussed and encouraged, as a part of hypertension management. The importance of attaining a healthy weight is also discussed.  BP/Weight 11/11/2016 05/26/2016 01/17/2016 08/08/2015 07/10/2015 03/17/7354 7/0/1410  Systolic BP 301 314 388 875 797 282 060  Diastolic BP 80 80 78 80 84 82 78  Wt. (Lbs) 201 199.4 199 236 234.8 - 235  BMI 31.48 31.23 31.17 36.95 37.33 - 37.95

## 2016-11-12 NOTE — Progress Notes (Signed)
   Todd Randall     MRN: [id number]      DOB: Jan 14, 1956   HPI Todd Randall is here for follow up and re-evaluation of chronic medical conditions, medication management and review of any available recent lab and radiology data.  Preventive health is updated, specifically  Cancer screening and Immunization.   Questions or concerns regarding consultations or procedures which the PT has had in the interim are  addressed. The PT denies any adverse reactions to current medications since the last visit.  C/o intermittent localized neck pain x 2 days , no inciting trauma 6 years alcohol free Still using e cigarettes No exercise commitment yet   ROS Denies recent fever or chills. Denies sinus pressure, nasal congestion, ear pain or sore throat. Denies chest congestion, productive cough or wheezing. Denies chest pains, palpitations and leg swelling Denies abdominal pain, nausea, vomiting,diarrhea or constipation.   Denies dysuria, frequency, hesitancy or incontinence. Denies headaches, seizures, numbness, or tingling. Denies depression, anxiety or insomnia. Denies skin break down or rash.   PE  BP 124/80   Pulse 100   Resp 16   Ht 5\' 7"  (1.702 m)   Wt 201 lb (91.2 kg)   SpO2 90%   BMI 31.48 kg/m   Patient alert and oriented and in no cardiopulmonary distress.  HEENT: No facial asymmetry, EOMI,   oropharynx pink and moist.  Neck supple no JVD, no mass.  Chest: Clear to auscultation bilaterally.Decreased though adequate air entry CVS: S1, S2 no murmurs, no S3.Regular rate.  ABD: Soft non tender.   Ext: No edema  MS: Adequate ROM spine, shoulders, hips and knees.  Skin: Intact, no ulcerations or rash noted.  Psych: Good eye contact, normal affect. Memory intact not anxious or depressed appearing.  CNS: CN 2-12 intact, power,  normal throughout.no focal deficits noted.   Assessment & Plan  Essential hypertension Controlled, no change in medication DASH diet and commitment  to daily physical activity for a minimum of 30 minutes discussed and encouraged, as a part of hypertension management. The importance of attaining a healthy weight is also discussed.  BP/Weight 11/11/2016 05/26/2016 01/17/2016 08/08/2015 07/10/2015 11/20/2839 05/17/4399  Systolic BP 027 253 664 403 474 259 563  Diastolic BP 80 80 78 80 84 82 78  Wt. (Lbs) 201 199.4 199 236 234.8 - 235  BMI 31.48 31.23 31.17 36.95 37.33 - 37.95       COPD with emphysema Controlled, no change in medication   Nicotine vapor product user Cessation counseling done for 5 minutes  Obesity (BMI 30.0-34.9) Deteriorated. Patient re-educated about  the importance of commitment to a  minimum of 150 minutes of exercise per week.  The importance of healthy food choices with portion control discussed. Encouraged to start a food diary, count calories and to consider  joining a support group. Sample diet sheets offered. Goals set by the patient for the next several months.   Weight /BMI 11/11/2016 05/26/2016 01/17/2016  WEIGHT 201 lb 199 lb 6.4 oz 199 lb  HEIGHT 5\' 7"  5\' 7"  5\' 7"   BMI 31.48 kg/m2 31.23 kg/m2 31.17 kg/m2

## 2016-11-12 NOTE — Assessment & Plan Note (Signed)
Controlled, no change in medication  

## 2016-11-12 NOTE — Assessment & Plan Note (Signed)
Deteriorated. Patient re-educated about  the importance of commitment to a  minimum of 150 minutes of exercise per week.  The importance of healthy food choices with portion control discussed. Encouraged to start a food diary, count calories and to consider  joining a support group. Sample diet sheets offered. Goals set by the patient for the next several months.   Weight /BMI 11/11/2016 05/26/2016 01/17/2016  WEIGHT 201 lb 199 lb 6.4 oz 199 lb  HEIGHT 5\' 7"  5\' 7"  5\' 7"   BMI 31.48 kg/m2 31.23 kg/m2 31.17 kg/m2

## 2016-11-12 NOTE — Assessment & Plan Note (Signed)
Cessation counseling done for 5 minutes

## 2016-12-04 ENCOUNTER — Other Ambulatory Visit: Payer: Self-pay | Admitting: Family Medicine

## 2016-12-04 NOTE — Telephone Encounter (Signed)
Seen 8 [phone] 18 

## 2016-12-08 IMAGING — NM NM MYOCAR MULTI W/SPECT W/WALL MOTION & EF
2 series · 12 of 12 positions shown · non-contrast
Comparison: none

[Series 1: rest · 8.28mm/px · 6 of 64 frames shown]
[frame 6/64]
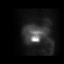
[frame 16/64]
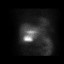
[frame 27/64]
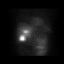
[frame 38/64]
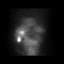
[frame 48/64]
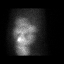
[frame 59/64]
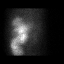

[Series 2: stress gated · 8.28mm/px · 6 of 64 frames shown]
[frame 6/64]
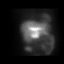
[frame 16/64]
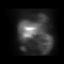
[frame 27/64]
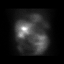
[frame 38/64]
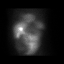
[frame 48/64]
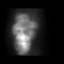
[frame 59/64]
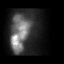

[12 of 12 positions shown; findings below may reference images not displayed]

Canned report from images found in remote index.

Refer to host system for actual result text.

## 2016-12-09 ENCOUNTER — Encounter: Payer: Self-pay | Admitting: Family Medicine

## 2016-12-15 ENCOUNTER — Other Ambulatory Visit: Payer: Self-pay | Admitting: Family Medicine

## 2017-01-13 ENCOUNTER — Other Ambulatory Visit: Payer: Self-pay | Admitting: Family Medicine

## 2017-01-13 DIAGNOSIS — I10 Essential (primary) hypertension: Secondary | ICD-10-CM

## 2017-01-16 ENCOUNTER — Other Ambulatory Visit: Payer: Self-pay | Admitting: Family Medicine

## 2017-01-17 ENCOUNTER — Encounter: Payer: Self-pay | Admitting: Family Medicine

## 2017-01-20 ENCOUNTER — Telehealth: Payer: Self-pay

## 2017-01-20 NOTE — Telephone Encounter (Signed)
rx sent to pharmacy

## 2017-01-20 NOTE — Telephone Encounter (Signed)
Error

## 2017-02-01 ENCOUNTER — Other Ambulatory Visit: Payer: Self-pay | Admitting: Family Medicine

## 2017-02-02 NOTE — Telephone Encounter (Signed)
Seen 8 [phone] 18 

## 2017-02-15 ENCOUNTER — Other Ambulatory Visit: Payer: Self-pay | Admitting: Family Medicine

## 2017-02-15 DIAGNOSIS — J4530 Mild persistent asthma, uncomplicated: Secondary | ICD-10-CM

## 2017-02-16 NOTE — Telephone Encounter (Signed)
Seen 8 [phone] 18 

## 2017-02-28 ENCOUNTER — Other Ambulatory Visit: Payer: Self-pay | Admitting: Family Medicine

## 2017-04-12 ENCOUNTER — Encounter: Payer: Self-pay | Admitting: Family Medicine

## 2017-04-12 DIAGNOSIS — I10 Essential (primary) hypertension: Secondary | ICD-10-CM

## 2017-04-12 DIAGNOSIS — J4530 Mild persistent asthma, uncomplicated: Secondary | ICD-10-CM

## 2017-04-13 MED ORDER — AMLODIPINE BESYLATE 10 MG PO TABS: 10.0000 mg | ORAL_TABLET | Freq: Every day | ORAL | 1 refills | Status: DC

## 2017-04-13 MED ORDER — SPIRONOLACTONE 25 MG PO TABS: 25.0000 mg | ORAL_TABLET | Freq: Every day | ORAL | 5 refills | Status: DC

## 2017-04-13 MED ORDER — CLONIDINE HCL 0.1 MG PO TABS: 0.1000 mg | ORAL_TABLET | Freq: Every day | ORAL | 1 refills | Status: DC

## 2017-04-13 MED ORDER — TIOTROPIUM BROMIDE MONOHYDRATE 18 MCG IN CAPS: ORAL_CAPSULE | RESPIRATORY_TRACT | 1 refills | Status: DC

## 2017-04-13 MED ORDER — PRAVASTATIN SODIUM 10 MG PO TABS: 10.0000 mg | ORAL_TABLET | Freq: Every day | ORAL | 1 refills | Status: DC

## 2017-04-13 MED ORDER — FLUTICASONE-SALMETEROL 250-50 MCG/DOSE IN AEPB: 1.0000 | INHALATION_SPRAY | Freq: Two times a day (BID) | RESPIRATORY_TRACT | 1 refills | Status: DC

## 2017-04-13 MED ORDER — ALLOPURINOL 300 MG PO TABS: 300.0000 mg | ORAL_TABLET | Freq: Every day | ORAL | 0 refills | Status: DC

## 2017-05-27 ENCOUNTER — Ambulatory Visit (INDEPENDENT_AMBULATORY_CARE_PROVIDER_SITE_OTHER): Payer: BLUE CROSS/BLUE SHIELD | Admitting: Family Medicine

## 2017-05-27 ENCOUNTER — Encounter: Payer: Self-pay | Admitting: Family Medicine

## 2017-05-27 VITALS — BP 130/82 | HR 86 | Resp 16 | Ht 67.0 in | Wt 199.0 lb

## 2017-05-27 DIAGNOSIS — Z125 Encounter for screening for malignant neoplasm of prostate: Secondary | ICD-10-CM | POA: Diagnosis not present

## 2017-05-27 DIAGNOSIS — I10 Essential (primary) hypertension: Secondary | ICD-10-CM

## 2017-05-27 DIAGNOSIS — M1000 Idiopathic gout, unspecified site: Secondary | ICD-10-CM | POA: Diagnosis not present

## 2017-05-27 DIAGNOSIS — Z789 Other specified health status: Secondary | ICD-10-CM

## 2017-05-27 DIAGNOSIS — Z0000 Encounter for general adult medical examination without abnormal findings: Secondary | ICD-10-CM | POA: Diagnosis not present

## 2017-05-27 DIAGNOSIS — Z720 Tobacco use: Secondary | ICD-10-CM

## 2017-05-27 DIAGNOSIS — Z800 Family history of malignant neoplasm of digestive organs: Secondary | ICD-10-CM | POA: Diagnosis not present

## 2017-05-27 DIAGNOSIS — E785 Hyperlipidemia, unspecified: Secondary | ICD-10-CM

## 2017-05-27 NOTE — Patient Instructions (Addendum)
F/U in 7 months, call if you need me before  Shingles vaccines due at age 62  CBC, uric acid, lipid,cmp and EGFr, PSA, TSH today  It is important that you exercise regularly at least 30 minutes 5 times a week. If you develop chest pain, have severe difficulty breathing, or feel very tired, stop exercising immediately and seek medical attention   Weight loss with a goal weight of 185 pounds  You are refered to Dr Shelva Majestic  Need to stop smoking e cigarette

## 2017-05-27 NOTE — Progress Notes (Signed)
   Todd Randall     MRN: [id number]      DOB: Aug 04, 1955   HPI: Patient is in for annual physical exam.  Immunization is reviewed , and  Is up to date   PE; BP 130/82   Pulse 86   Resp 16   Ht 5\' 7"  (1.702 m)   Wt 199 lb (90.3 kg)   SpO2 92%   BMI 31.17 kg/m   Pleasant male, alert and oriented x 3, in no cardio-pulmonary distress. Afebrile. HEENT No facial trauma or asymetry. Sinuses non tender. EOMI, pupils equally . External ears normal, tympanic membranes clear. Oropharynx moist, no exudate. Neck: supple, no adenopathy,JVD or thyromegaly.No bruits.  Chest: Clear to ascultation bilaterally.No crackles or wheezes. Non tender to palpation  Breast: No asymetry,no masses. No nipple discharge or inversion. No axillary or supraclavicular adenopathy  Cardiovascular system; Heart sounds normal,  S1 and  S2 ,no S3.  No murmur, or thrill. Apical beat not displaced Peripheral pulses normal.  Abdomen: Soft, non tender, no organomegaly or masses. No bruits. Bowel sounds normal. No guarding, tenderness or rebound.  Rectal:  Deferred, needs colonoscopy this year on 5 year screening due to colon cancer in mother under the age of 39    Musculoskeletal exam: Full ROM of spine, hips , shoulders and knees. No deformity ,swelling or crepitus noted. No muscle wasting or atrophy.   Neurologic: Cranial nerves 2 to 12 intact. Power, tone ,sensation and reflexes normal throughout. No disturbance in gait. No tremor.  Skin: Intact, no ulceration, erythema , scaling or rash noted. Pigmentation normal throughout  Psych; Normal mood and affect. Judgement and concentration normal   Assessment & Plan:  Annual physical exam Annual exam as documented. Counseling done  re healthy lifestyle involving commitment to 150 minutes exercise per week, heart healthy diet, and attaining healthy weight.The importance of adequate sleep also discussed. Regular seat belt use and home safety,  is also discussed. Changes in health habits are decided on by the patient with goals and time frames  set for achieving them. Immunization and cancer screening needs are specifically addressed at this visit.   Nicotine vapor product user asked and confirms current use of e cigarette Advised : need to quit has emphysema, and this will increase all cancer risks, has f/h colon ca Assess : not ready to quit Arrange f/u in 6 months, advised of [place] access 24/7

## 2017-05-27 NOTE — Progress Notes (Signed)
   Todd Randall     MRN: [id number]      DOB: Mar 14, 1956

## 2017-05-28 ENCOUNTER — Encounter (INDEPENDENT_AMBULATORY_CARE_PROVIDER_SITE_OTHER): Payer: Self-pay | Admitting: *Deleted

## 2017-05-28 LAB — CBC
HCT: 40.5 % (ref 38.5–50.0)
Hemoglobin: 12.7 g/dL — ABNORMAL LOW (ref 13.2–17.1)
MCH: 27.1 pg (ref 27.0–33.0)
MCHC: 31.4 g/dL — ABNORMAL LOW (ref 32.0–36.0)
MCV: 86.5 fL (ref 80.0–100.0)
MPV: 10.7 fL (ref 7.5–12.5)
Platelets: 252 10*3/uL (ref 140–400)
RBC: 4.68 10*6/uL (ref 4.20–5.80)
RDW: 13.5 % (ref 11.0–15.0)
WBC: 3.9 10*3/uL (ref 3.8–10.8)

## 2017-05-28 LAB — COMPLETE METABOLIC PANEL WITH GFR
AG Ratio: 1.7 (calc) (ref 1.0–2.5)
ALT: 14 U/L (ref 9–46)
AST: 18 U/L (ref 10–35)
Albumin: 4.6 g/dL (ref 3.6–5.1)
Alkaline phosphatase (APISO): 64 U/L (ref 40–115)
BUN: 20 mg/dL (ref 7–25)
CO2: 32 mmol/L (ref 20–32)
Calcium: 10.1 mg/dL (ref 8.6–10.3)
Chloride: 101 mmol/L (ref 98–110)
Creat: 0.83 mg/dL (ref 0.70–1.33)
GFR, Est African American: 112 mL/min/{1.73_m2} (ref >=60)
GFR, Est Non African American: 96 mL/min/{1.73_m2} (ref >=60)
Globulin: 2.7 g/dL (calc) (ref 1.9–3.7)
Glucose, Bld: 102 mg/dL — ABNORMAL HIGH (ref 65–99)
Potassium: 4.6 mmol/L (ref 3.5–5.3)
Sodium: 138 mmol/L (ref 135–146)
Total Bilirubin: 0.4 mg/dL (ref 0.2–1.2)
Total Protein: 7.3 g/dL (ref 6.1–8.1)

## 2017-05-28 LAB — LIPID PANEL
Cholesterol: 137 mg/dL (ref <200)
HDL: 30 mg/dL — ABNORMAL LOW (ref >40)
LDL Cholesterol (Calc): 87 mg/dL (calc)
Non-HDL Cholesterol (Calc): 107 mg/dL (calc) (ref <130)
Total CHOL/HDL Ratio: 4.6 (calc) (ref <5.0)
Triglycerides: 108 mg/dL (ref <150)

## 2017-05-28 LAB — URIC ACID: Uric Acid, Serum: 4.9 mg/dL (ref 4.0–8.0)

## 2017-05-28 LAB — TSH: TSH: 0.92 mIU/L (ref 0.40–4.50)

## 2017-05-28 LAB — PSA: PSA: 4.2 ng/mL — ABNORMAL HIGH (ref <=4.0)

## 2017-05-29 ENCOUNTER — Other Ambulatory Visit: Payer: Self-pay | Admitting: Family Medicine

## 2017-05-29 ENCOUNTER — Encounter: Payer: Self-pay | Admitting: Family Medicine

## 2017-05-29 DIAGNOSIS — R972 Elevated prostate specific antigen [PSA]: Principal | ICD-10-CM

## 2017-05-29 DIAGNOSIS — N400 Enlarged prostate without lower urinary tract symptoms: Secondary | ICD-10-CM

## 2017-05-31 NOTE — Assessment & Plan Note (Signed)
asked and confirms current use of e cigarette Advised : need to quit has emphysema, and this will increase all cancer risks, has f/h colon ca Assess : not ready to quit Arrange f/u in 6 months, advised of [place] access 24/7

## 2017-05-31 NOTE — Assessment & Plan Note (Signed)

## 2017-06-05 ENCOUNTER — Encounter: Payer: Self-pay | Admitting: Family Medicine

## 2017-06-07 ENCOUNTER — Encounter: Payer: Self-pay | Admitting: Family Medicine

## 2017-06-08 ENCOUNTER — Encounter: Payer: Self-pay | Admitting: Family Medicine

## 2017-06-14 ENCOUNTER — Other Ambulatory Visit: Payer: Self-pay | Admitting: Family Medicine

## 2017-07-16 ENCOUNTER — Other Ambulatory Visit (INDEPENDENT_AMBULATORY_CARE_PROVIDER_SITE_OTHER): Payer: Self-pay | Admitting: *Deleted

## 2017-07-16 DIAGNOSIS — Z800 Family history of malignant neoplasm of digestive organs: Secondary | ICD-10-CM

## 2017-07-21 ENCOUNTER — Ambulatory Visit: Payer: BLUE CROSS/BLUE SHIELD | Admitting: Urology

## 2017-07-21 DIAGNOSIS — R972 Elevated prostate specific antigen [PSA]: Secondary | ICD-10-CM

## 2017-07-21 DIAGNOSIS — R3912 Poor urinary stream: Secondary | ICD-10-CM

## 2017-07-21 DIAGNOSIS — N401 Enlarged prostate with lower urinary tract symptoms: Secondary | ICD-10-CM

## 2017-07-22 ENCOUNTER — Other Ambulatory Visit: Payer: Self-pay | Admitting: Urology

## 2017-07-22 DIAGNOSIS — R972 Elevated prostate specific antigen [PSA]: Secondary | ICD-10-CM

## 2017-09-08 ENCOUNTER — Ambulatory Visit (HOSPITAL_COMMUNITY)
Admission: RE | Admit: 2017-09-08 | Discharge: 2017-09-08 | Disposition: A | Discharge status: Home / Self Care | Payer: BLUE CROSS/BLUE SHIELD | Source: Ambulatory Visit | Attending: Urology | Admitting: Urology

## 2017-09-08 DIAGNOSIS — Z8042 Family history of malignant neoplasm of prostate: Secondary | ICD-10-CM | POA: Insufficient documentation

## 2017-09-08 DIAGNOSIS — R972 Elevated prostate specific antigen [PSA]: Secondary | ICD-10-CM | POA: Insufficient documentation

## 2017-09-08 MED ORDER — LIDOCAINE HCL (PF) 2 % IJ SOLN
INTRAMUSCULAR | Status: AC
  Administered 2017-09-08: 10 mL
  Filled 2017-09-08: qty 10

## 2017-09-08 MED ORDER — GENTAMICIN SULFATE 40 MG/ML IJ SOLN
160.0000 mg | Freq: Once | INTRAMUSCULAR | Status: AC
  Administered 2017-09-08: 160 mg via INTRAMUSCULAR

## 2017-09-08 MED ORDER — GENTAMICIN SULFATE 40 MG/ML IJ SOLN
INTRAMUSCULAR | Status: AC
  Administered 2017-09-08: 160 mg via INTRAMUSCULAR
  Filled 2017-09-08: qty 4

## 2017-09-08 MED ORDER — LIDOCAINE HCL (PF) 2 % IJ SOLN
10.0000 mL | Freq: Once | INTRAMUSCULAR | Status: AC
  Administered 2017-09-08: 10 mL

## 2017-09-15 ENCOUNTER — Telehealth (INDEPENDENT_AMBULATORY_CARE_PROVIDER_SITE_OTHER): Payer: Self-pay | Admitting: *Deleted

## 2017-09-15 ENCOUNTER — Encounter (INDEPENDENT_AMBULATORY_CARE_PROVIDER_SITE_OTHER): Payer: Self-pay | Admitting: *Deleted

## 2017-09-15 MED ORDER — PEG 3350-KCL-NA BICARB-NACL 420 G PO SOLR: 4000.0000 mL | Freq: Once | ORAL | 0 refills | Status: AC

## 2017-09-15 NOTE — Telephone Encounter (Signed)
Patient needs trilyte 

## 2017-09-26 ENCOUNTER — Other Ambulatory Visit: Payer: Self-pay | Admitting: Family Medicine

## 2017-10-06 ENCOUNTER — Telehealth (INDEPENDENT_AMBULATORY_CARE_PROVIDER_SITE_OTHER): Payer: Self-pay | Admitting: *Deleted

## 2017-10-06 NOTE — Telephone Encounter (Signed)
Referring MD/PCP: simpson   Procedure: tcs  Reason/Indication:  fam hx colon ca  Has patient had this procedure before?  Yes, 10/2012  If so, when, by whom and where?    Is there a family history of colon cancer?  Yes, mother  Who?  What age when diagnosed?    Is patient diabetic?   no      Does patient have prosthetic heart valve or mechanical valve?  no  Do you have a pacemaker?  no  Has patient ever had endocarditis? no  Has patient had joint replacement within last 12 months?  no  Is patient constipated or do they take laxatives? no  Does patient have a history of alcohol/drug use?  no  Is patient on blood thinner such as Coumadin, Plavix and/or Aspirin? yes  Medications: advair diskus 250/50 1 puff bid, allopurinol 300 mg daily, asa 81 mg daily, amlodipine 10 mg daily, centrum silver, fish oil pmega 3 daily, pravastatin 20 mg daily, spironolactone 25 mg daily, vit b12 daily, CoQ 10 daily, Vit D3 daily, magnesium daily, zinc daily  Allergies: nkda  Medication Adjustment per Dr Lindi Adie, NP: asa 2 days  Procedure date & time: 10/29/17 at 1200

## 2017-10-07 NOTE — Telephone Encounter (Signed)
agree

## 2017-10-29 ENCOUNTER — Encounter (HOSPITAL_COMMUNITY)
Admission: RE | Disposition: A | Discharge status: Home / Self Care | Payer: Self-pay | Source: Ambulatory Visit | Attending: Internal Medicine

## 2017-10-29 ENCOUNTER — Ambulatory Visit (HOSPITAL_COMMUNITY)
Admission: RE | Admit: 2017-10-29 | Discharge: 2017-10-29 | Disposition: A | Discharge status: Home / Self Care | Payer: BLUE CROSS/BLUE SHIELD | Source: Ambulatory Visit | Attending: Internal Medicine | Admitting: Internal Medicine

## 2017-10-29 ENCOUNTER — Other Ambulatory Visit: Payer: Self-pay

## 2017-10-29 DIAGNOSIS — D120 Benign neoplasm of cecum: Secondary | ICD-10-CM | POA: Insufficient documentation

## 2017-10-29 DIAGNOSIS — D123 Benign neoplasm of transverse colon: Secondary | ICD-10-CM | POA: Insufficient documentation

## 2017-10-29 DIAGNOSIS — Z800 Family history of malignant neoplasm of digestive organs: Secondary | ICD-10-CM | POA: Diagnosis not present

## 2017-10-29 DIAGNOSIS — Z7951 Long term (current) use of inhaled steroids: Secondary | ICD-10-CM | POA: Diagnosis not present

## 2017-10-29 DIAGNOSIS — K644 Residual hemorrhoidal skin tags: Secondary | ICD-10-CM | POA: Insufficient documentation

## 2017-10-29 DIAGNOSIS — Z888 Allergy status to other drugs, medicaments and biological substances status: Secondary | ICD-10-CM | POA: Insufficient documentation

## 2017-10-29 DIAGNOSIS — Z86010 Personal history of colonic polyps: Secondary | ICD-10-CM | POA: Insufficient documentation

## 2017-10-29 DIAGNOSIS — F17290 Nicotine dependence, other tobacco product, uncomplicated: Secondary | ICD-10-CM | POA: Insufficient documentation

## 2017-10-29 DIAGNOSIS — Z1211 Encounter for screening for malignant neoplasm of colon: Secondary | ICD-10-CM | POA: Insufficient documentation

## 2017-10-29 DIAGNOSIS — Z09 Encounter for follow-up examination after completed treatment for conditions other than malignant neoplasm: Secondary | ICD-10-CM

## 2017-10-29 DIAGNOSIS — E785 Hyperlipidemia, unspecified: Secondary | ICD-10-CM | POA: Diagnosis not present

## 2017-10-29 DIAGNOSIS — Z79899 Other long term (current) drug therapy: Secondary | ICD-10-CM | POA: Insufficient documentation

## 2017-10-29 DIAGNOSIS — Z7982 Long term (current) use of aspirin: Secondary | ICD-10-CM | POA: Insufficient documentation

## 2017-10-29 DIAGNOSIS — K5730 Diverticulosis of large intestine without perforation or abscess without bleeding: Secondary | ICD-10-CM | POA: Diagnosis not present

## 2017-10-29 DIAGNOSIS — I10 Essential (primary) hypertension: Secondary | ICD-10-CM | POA: Insufficient documentation

## 2017-10-29 DIAGNOSIS — J449 Chronic obstructive pulmonary disease, unspecified: Secondary | ICD-10-CM | POA: Insufficient documentation

## 2017-10-29 HISTORY — PX: COLONOSCOPY: SHX5424

## 2017-10-29 HISTORY — PX: POLYPECTOMY: SHX5525

## 2017-10-29 SURGERY — COLONOSCOPY
Anesthesia: Moderate Sedation

## 2017-10-29 MED ORDER — MIDAZOLAM HCL 5 MG/5ML IJ SOLN
INTRAMUSCULAR | Status: DC | PRN
Start: 2017-10-29 — End: 2017-10-29
  Administered 2017-10-29 (×2): 2 mg via INTRAVENOUS

## 2017-10-29 MED ORDER — MEPERIDINE HCL 50 MG/ML IJ SOLN
INTRAMUSCULAR | Status: AC
  Filled 2017-10-29: qty 1

## 2017-10-29 MED ORDER — SIMETHICONE 40 MG/0.6ML PO SUSP
ORAL | Status: DC | PRN
  Administered 2017-10-29: 11:00:00

## 2017-10-29 MED ORDER — SODIUM CHLORIDE 0.9 % IV SOLN
INTRAVENOUS | Status: DC
  Administered 2017-10-29: 11:00:00 via INTRAVENOUS

## 2017-10-29 MED ORDER — MEPERIDINE HCL 50 MG/ML IJ SOLN
INTRAMUSCULAR | Status: DC | PRN
  Administered 2017-10-29: 25 mg via INTRAVENOUS

## 2017-10-29 MED ORDER — MIDAZOLAM HCL 5 MG/5ML IJ SOLN
INTRAMUSCULAR | Status: AC
  Filled 2017-10-29: qty 10

## 2017-10-29 NOTE — Discharge Instructions (Addendum)
Colonoscopy, Adult, Care After This sheet gives you information about how to care for yourself after your procedure. Your doctor may also give you more specific instructions. If you have problems or questions, call your doctor. Follow these instructions at home: General instructions   For the first 24 hours after the procedure: ? Do not drive or use machinery. ? Do not sign important documents. ? Do not drink alcohol. ? Do your daily activities more slowly than normal. ? Eat foods that are soft and easy to digest. ? Rest often.  Take over-the-counter or prescription medicines only as told by your doctor.  It is up to you to get the results of your procedure. Ask your doctor, or the department performing the procedure, when your results will be ready. To help cramping and bloating:  Try walking around.  Put heat on your belly (abdomen) as told by your doctor. Use a heat source that your doctor recommends, such as a moist heat pack or a heating pad. ? Put a towel between your skin and the heat source. ? Leave the heat on for 20-30 minutes. ? Remove the heat if your skin turns bright red. This is especially important if you cannot feel pain, heat, or cold. You can get burned. Eating and drinking  Drink enough fluid to keep your pee (urine) clear or pale yellow.  Return to your normal diet as told by your doctor. Avoid heavy or fried foods that are hard to digest.  Avoid drinking alcohol for as long as told by your doctor. Contact a doctor if:  You have blood in your poop (stool) 2-3 days after the procedure. Get help right away if:  You have more than a small amount of blood in your poop.  You see large clumps of tissue (blood clots) in your poop.  Your belly is swollen.  You feel sick to your stomach (nauseous).  You throw up (vomit).  You have a fever.  You have belly pain that gets worse, and medicine does not help your pain. This information is not intended to  replace advice given to you by your health care provider. Make sure you discuss any questions you have with your health care provider. Document Released: 04/05/2010 Document Revised: 11/26/2015 Document Reviewed: 11/26/2015 Elsevier Interactive Patient Education  2017 [employer]. Colon Polyps Polyps are tissue growths inside the body. Polyps can grow in many places, including the large intestine (colon). A polyp may be a round bump or a mushroom-shaped growth. You could have one polyp or several. Most colon polyps are noncancerous (benign). However, some colon polyps can become cancerous over time. What are the causes? The exact cause of colon polyps is not known. What increases the risk? This condition is more likely to develop in people who:  Have a family history of colon cancer or colon polyps.  Are older than 79 or older than 45 if they are African American.  Have inflammatory bowel disease, such as ulcerative colitis or Crohn disease.  Are overweight.  Smoke cigarettes.  Do not get enough exercise.  Drink too much alcohol.  Eat a diet that is: ? High in fat and red meat. ? Low in fiber.  Had childhood cancer that was treated with abdominal radiation.  What are the signs or symptoms? Most polyps do not cause symptoms. If you have symptoms, they may include:  Blood coming from your rectum when having a bowel movement.  Blood in your stool.The stool may look dark red  or black.  A change in bowel habits, such as constipation or diarrhea.  How is this diagnosed? This condition is diagnosed with a colonoscopy. This is a procedure that uses a lighted, flexible scope to look at the inside of your colon. How is this treated? Treatment for this condition involves removing any polyps that are found. Those polyps will then be tested for cancer. If cancer is found, your health care provider will talk to you about options for colon cancer treatment. Follow these instructions  at home: Diet  Eat plenty of fiber, such as fruits, vegetables, and whole grains.  Eat foods that are high in calcium and vitamin D, such as milk, cheese, yogurt, eggs, liver, fish, and broccoli.  Limit foods high in fat, red meats, and processed meats, such as hot dogs, sausage, bacon, and lunch meats.  Maintain a healthy weight, or lose weight if recommended by your health care provider. General instructions  Do not smoke cigarettes.  Do not drink alcohol excessively.  Keep all follow-up visits as told by your health care provider. This is important. This includes keeping regularly scheduled colonoscopies. Talk to your health care provider about when you need a colonoscopy.  Exercise every day or as told by your health care provider. Contact a health care provider if:  You have new or worsening bleeding during a bowel movement.  You have new or increased blood in your stool.  You have a change in bowel habits.  You unexpectedly lose weight. This information is not intended to replace advice given to you by your health care provider. Make sure you discuss any questions you have with your health care provider. Document Released: 11/28/2003 Document Revised: 08/09/2015 Document Reviewed: 01/22/2015 Elsevier Interactive Patient Education  2018 [employer]. Resume aspirin on 10/30/2017. Resume other medications as before. High-fiber diet. No driving for 24 hours. Physician will call with biopsy results. Next colonoscopy in 5 years.

## 2017-10-29 NOTE — H&P (Signed)
Todd Randall is an 62 y.o. male.   Chief Complaint: Patient is here for colonoscopy. HPI: Patient is a 10-year-old African-American male who has a history of colonic adenoma and family history of CRC who is here for surveillance colonoscopy.  He denies abdominal pain change in bowel habits or rectal bleeding. Patient had tubular adenoma removed in August 2007 and no polyp was found on his last exam August 2014. Family history is positive for CRC in mother who was 57 at the time of diagnosis and died a year later with metastatic disease.   Past Medical History:  Diagnosis Date  . COPD (chronic obstructive pulmonary disease) ([place])   . Hyperlipidemia   . Hypertension     Past Surgical History:  Procedure Laterality Date  . COLONOSCOPY N/A 11/10/2012   Procedure: COLONOSCOPY;  Surgeon: Rogene Houston, MD;  Location: [facility];  Service: Endoscopy;  Laterality: N/A;  205-moved to 120 Ann to notify pt  . 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    Family History  Problem Relation Age of Onset  . Colon cancer Mother    Social History:  reports that he has been smoking e-cigarettes. He has never used smokeless tobacco. He reports that he does not drink alcohol or use drugs.  Allergies:  Allergies  Allergen Reactions  . Lisinopril Cough    Medications Prior to Admission  Medication Sig Dispense Refill  . albuterol (PROVENTIL HFA;VENTOLIN HFA) 108 (90 BASE) MCG/ACT inhaler Inhale 2 puffs into the lungs every 6 (six) hours as needed for wheezing.    Marland Kitchen allopurinol (ZYLOPRIM) 300 MG tablet TAKE 1 TABLET BY MOUTH  DAILY 90 tablet 0  . amLODipine (NORVASC) 10 MG tablet TAKE 1 TABLET BY MOUTH  DAILY 90 tablet 1  . aspirin 81 MG tablet Take 81 mg by mouth daily.    . cholecalciferol (VITAMIN D) 1000 units tablet Take 1,000 Units by mouth daily.    . cloNIDine (CATAPRES) 0.1 MG tablet TAKE 1 TABLET BY MOUTH  DAILY WITH BREAKFAST 90 tablet 1  . Coenzyme Q10 (COQ10)  100 MG CAPS Take 100 mg by mouth daily.    . Cyanocobalamin (B-12) 2500 MCG TABS Take 2,500 mcg by mouth daily.    . fish oil-omega-3 fatty acids 1000 MG capsule Take 2 g by mouth daily.     . Fluticasone-Salmeterol (ADVAIR DISKUS) 250-50 MCG/DOSE AEPB Inhale 1 puff into the lungs 2 (two) times daily. 3 each 1  . Magnesium 400 MG TABS Take 400 mg by mouth daily.    . Multiple Vitamin (MULTIVITAMIN) tablet Take 1 tablet by mouth daily.    . pravastatin (PRAVACHOL) 10 MG tablet TAKE 1 TABLET BY MOUTH  DAILY 90 tablet 1  . spironolactone (ALDACTONE) 25 MG tablet Take 1 tablet (25 mg total) by mouth daily. 30 tablet 5  . zinc gluconate 50 MG tablet Take 50 mg by mouth daily.      No results found for this or any previous visit (from the past 48 hour(s)). No results found.  ROS  Blood pressure 109/70, pulse 72, temperature 98.4 F (36.9 C), temperature source Oral, resp. rate 14, height 5' 6.5" (1.689 m), weight 90.7 kg, SpO2 97 %. Physical Exam  Constitutional: He appears well-developed and well-nourished.  HENT:  Mouth/Throat: Oropharynx is clear and moist.  Eyes: Conjunctivae are normal. No scleral icterus.  Neck: No thyromegaly present.  Cardiovascular: Normal rate, regular rhythm and normal  heart sounds.  No murmur heard. Respiratory: Effort normal and breath sounds normal.  GI: Soft. He exhibits no distension and no mass. There is no tenderness.  Scar on either side of umbilicus left longer than the right.  Musculoskeletal: He exhibits no edema.  Lymphadenopathy:    He has no cervical adenopathy.  Neurological: He is alert.  Skin: Skin is warm and dry.     Assessment/Plan History of colonic adenoma. History of CRC in first-degree relative. Surveillance colonoscopy.  Hildred Laser, MD 10/29/2017, 11:12 AM

## 2017-10-29 NOTE — Op Note (Signed)
[facility] Patient Name: Todd Randall Procedure Date: 10/29/2017 11:06 AM MRN: [id number] Date of Birth: 08-21-1955 Attending MD: Hildred Laser , MD CSN: [id number] Age: 62 Admit Type: Outpatient Procedure:                Colonoscopy Indications:              High risk colon cancer surveillance: Personal                            history of colonic polyps, Family history of colon                            cancer in a first-degree relative Providers:                Hildred Laser, MD, Otis Peak B. Sharon Seller, RN, Nelma Rothman, Technician Referring MD:             Norwood Levo. Moshe Cipro, MD Medicines:                Meperidine 25 mg IV, Midazolam 4 mg IV Complications:            No immediate complications. Estimated Blood Loss:     Estimated blood loss was minimal. Procedure:                Pre-Anesthesia Assessment:                           - Prior to the procedure, a History and Physical                            was performed, and patient medications and                            allergies were reviewed. The patient's tolerance of                            previous anesthesia was also reviewed. The risks                            and benefits of the procedure and the sedation                            options and risks were discussed with the patient.                            All questions were answered, and informed consent                            was obtained. Prior Anticoagulants: The patient                            last took aspirin 3 days prior to the procedure.  ASA Grade Assessment: II - A patient with mild                            systemic disease. After reviewing the risks and                            benefits, the patient was deemed in satisfactory                            condition to undergo the procedure.                           After obtaining informed consent, the colonoscope      was passed under direct vision. Throughout the                            procedure, the patient's blood pressure, pulse, and                            oxygen saturations were monitored continuously. The                            [id number]-[id number] ([id number]) scope was introduced through                            the anus and advanced to the the cecum, identified                            by appendiceal orifice and ileocecal valve. The                            colonoscopy was performed without difficulty. The                            patient tolerated the procedure well. The quality                            of the bowel preparation was excellent. Scope In: 11:22:37 AM Scope Out: 11:42:46 AM Scope Withdrawal Time: 0 hours 15 minutes 6 seconds  Total Procedure Duration: 0 hours 20 minutes 9 seconds  Findings:      The perianal and digital rectal examinations were normal.      Two sessile polyps were found in the cecum. The polyps were small in       size. These were biopsied with a cold forceps for histology. The       pathology specimen was placed into Bottle Number 1.      A small polyp was found in the hepatic flexure. The polyp was sessile.       Biopsies were taken with a cold forceps for histology. The pathology       specimen was placed into Bottle Number 1.      A few small-mouthed diverticula were found in the sigmoid colon.      External hemorrhoids were found during retroflexion. The hemorrhoids       were small. Impression:               -  Two small polyps in the cecum. Biopsied.                           - One small polyp at the hepatic flexure. Biopsied.                           - Diverticulosis in the sigmoid colon.                           - External hemorrhoids. Moderate Sedation:      Moderate (conscious) sedation was administered by the endoscopy nurse       and supervised by the endoscopist. The following parameters were       monitored: oxygen saturation, heart  rate, blood pressure, CO2       capnography and response to care. Total physician intraservice time was       25 minutes. Recommendation:           - Patient has a contact number available for                            emergencies. The signs and symptoms of potential                            delayed complications were discussed with the                            patient. Return to normal activities tomorrow.                            Written discharge instructions were provided to the                            patient.                           - High fiber diet today.                           - Continue present medications.                           - No aspirin, ibuprofen, naproxen, or other                            non-steroidal anti-inflammatory drugs for 1 day.                           - Await pathology results.                           - Repeat colonoscopy in 5 years for surveillance. Procedure Code(s):        --- Professional ---                           [phone], Colonoscopy, flexible; with biopsy, single  or multiple                           G0500, Moderate sedation services provided by the                            same physician or other qualified health care                            professional performing a gastrointestinal                            endoscopic service that sedation supports,                            requiring the presence of an independent trained                            observer to assist in the monitoring of the                            patient's level of consciousness and physiological                            status; initial 15 minutes of intra-service time;                            patient age 44 years or older (additional time may                            be reported with [phone], as appropriate)                           [phone], Moderate sedation services provided by the                            same  physician or other qualified health care                            professional performing the diagnostic or                            therapeutic service that the sedation supports,                            requiring the presence of an independent trained                            observer to assist in the monitoring of the                            patient's level of consciousness and physiological                            status;  each additional 15 minutes intraservice                            time (List separately in addition to code for                            primary service) Diagnosis Code(s):        --- Professional ---                           Z86.010, Personal history of colonic polyps                           D12.0, Benign neoplasm of cecum                           D12.3, Benign neoplasm of transverse colon (hepatic                            flexure or splenic flexure)                           K64.4, Residual hemorrhoidal skin tags                           Z80.0, Family history of malignant neoplasm of                            digestive organs                           K57.30, Diverticulosis of large intestine without                            perforation or abscess without bleeding CPT copyright 2017 American [employer]. All rights reserved. The codes documented in this report are preliminary and upon coder review may  be revised to meet current compliance requirements. Hildred Laser, MD Hildred Laser, MD 10/29/2017 11:50:30 AM This report has been signed electronically. Number of Addenda: 0

## 2017-11-03 ENCOUNTER — Encounter (HOSPITAL_COMMUNITY): Payer: Self-pay | Admitting: Internal Medicine

## 2017-11-19 ENCOUNTER — Ambulatory Visit (INDEPENDENT_AMBULATORY_CARE_PROVIDER_SITE_OTHER): Payer: BLUE CROSS/BLUE SHIELD | Admitting: Family Medicine

## 2017-11-19 ENCOUNTER — Encounter: Payer: Self-pay | Admitting: Family Medicine

## 2017-11-19 ENCOUNTER — Other Ambulatory Visit: Payer: Self-pay

## 2017-11-19 VITALS — BP 112/68 | HR 75 | Temp 98.9°F | Resp 12 | Ht 66.5 in | Wt 203.1 lb

## 2017-11-19 DIAGNOSIS — H5789 Other specified disorders of eye and adnexa: Secondary | ICD-10-CM | POA: Diagnosis not present

## 2017-11-19 NOTE — Progress Notes (Signed)
Patient ID: Todd Randall, male    DOB: April 07, 1955, 62 y.o.   MRN: [id number]  Chief Complaint  Patient presents with  . Eye Pain    mainly right eye but some with the left as well. More than 2 weeks.  . Eye Irritation    Allergies Lisinopril  Subjective:   Todd Randall is a 62 y.o. male who presents to [facility] today.  HPI Todd Randall presents today because about 2 weeks ago he squirted toothpaste and it got in his right eye.  He reports immediately following and since then has had eye itching and irritation.  He describes the itching as intense and bothersome.  He did try to wash his eye out but he has not had any improvement.  He reports that his right eye has been draining/tearing since the incident and wakes in the morning with matting of his eye.  He reports that he does have a foreign body sensation that something is in his eye and is just not normal.  He does not complain of light sensitivity.  He reports his vision is okay. Has used Visine over-the-counter drops with no improvement.  He does not have an eye doctor.  He comes in today wanting some drops they will make it better. Eye Problem   The right eye is affected. This is a new problem. The current episode started 1 to 4 weeks ago. The problem occurs constantly. The problem has been unchanged. The injury mechanism was a chemical exposure. The pain is at a severity of 2/10. The pain is mild. There is no known exposure to pink eye. He does not wear contacts. Associated symptoms include a foreign body sensation and itching. Pertinent negatives include no blurred vision, eye discharge, double vision, eye redness, fever, nausea, photophobia, recent URI or vomiting. He has tried eye drops for the symptoms. The treatment provided no relief.    Past Medical History:  Diagnosis Date  . COPD (chronic obstructive pulmonary disease) ([place])   . Hyperlipidemia   . Hypertension     Past Surgical History:  Procedure  Laterality Date  . COLONOSCOPY N/A 11/10/2012   Procedure: COLONOSCOPY;  Surgeon: Rogene Houston, MD;  Location: [facility];  Service: Endoscopy;  Laterality: N/A;  205-moved to 120 Ann to notify pt  . COLONOSCOPY N/A 10/29/2017   Procedure: COLONOSCOPY;  Surgeon: Rogene Houston, MD;  Location: [facility];  Service: Endoscopy;  Laterality: N/A;  1200  . POLYPECTOMY  10/29/2017   Procedure: POLYPECTOMY;  Surgeon: Rogene Houston, MD;  Location: [facility];  Service: Endoscopy;;  colon  . 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    Family History  Problem Relation Age of Onset  . Colon cancer Mother      Social History   Socioeconomic History  . Marital status: Married    Spouse name: Not on file  . Number of children: Not on file  . Years of education: Not on file  . Highest education level: Not on file  Occupational History  . Not on file  Social Needs  . Financial resource strain: Not on file  . Food insecurity:    Worry: Not on file    Inability: Not on file  . Transportation needs:    Medical: Not on file    Non-medical: Not on file  Tobacco Use  . Smoking status: Current Every Day Smoker  Types: E-cigarettes  . Smokeless tobacco: Never Used  . Tobacco comment: quit 2 yrs ago. He is using the E-cigarettes  Substance and Sexual Activity  . Alcohol use: No    Alcohol/week: 0.0 standard drinks  . Drug use: No  . Sexual activity: Yes  Lifestyle  . Physical activity:    Days per week: Not on file    Minutes per session: Not on file  . Stress: Not on file  Relationships  . Social connections:    Talks on phone: Not on file    Gets together: Not on file    Attends religious service: Not on file    Active member of club or organization: Not on file    Attends meetings of clubs or organizations: Not on file    Relationship status: Not on file  Other Topics Concern  . Not on file  Social History Narrative  . Not on file    Current Outpatient Medications on File Prior to Visit  Medication Sig Dispense Refill  . albuterol (PROVENTIL HFA;VENTOLIN HFA) 108 (90 BASE) MCG/ACT inhaler Inhale 2 puffs into the lungs every 6 (six) hours as needed for wheezing.    Marland Kitchen allopurinol (ZYLOPRIM) 300 MG tablet TAKE 1 TABLET BY MOUTH  DAILY 90 tablet 0  . amLODipine (NORVASC) 10 MG tablet TAKE 1 TABLET BY MOUTH  DAILY 90 tablet 1  . aspirin 81 MG tablet Take 1 tablet (81 mg total) by mouth daily. 30 tablet   . cholecalciferol (VITAMIN D) 1000 units tablet Take 1,000 Units by mouth daily.    . cloNIDine (CATAPRES) 0.1 MG tablet TAKE 1 TABLET BY MOUTH  DAILY WITH BREAKFAST 90 tablet 1  . Coenzyme Q10 (COQ10) 100 MG CAPS Take 100 mg by mouth daily.    . Cyanocobalamin (B-12) 2500 MCG TABS Take 2,500 mcg by mouth daily.    . fish oil-omega-3 fatty acids 1000 MG capsule Take 2 g by mouth daily.     . Fluticasone-Salmeterol (ADVAIR DISKUS) 250-50 MCG/DOSE AEPB Inhale 1 puff into the lungs 2 (two) times daily. 3 each 1  . Magnesium 400 MG TABS Take 400 mg by mouth daily.    . Multiple Vitamin (MULTIVITAMIN) tablet Take 1 tablet by mouth daily.    . pravastatin (PRAVACHOL) 10 MG tablet TAKE 1 TABLET BY MOUTH  DAILY 90 tablet 1  . spironolactone (ALDACTONE) 25 MG tablet Take 1 tablet (25 mg total) by mouth daily. 30 tablet 5  . zinc gluconate 50 MG tablet Take 50 mg by mouth daily.     No current facility-administered medications on file prior to visit.     Review of Systems  Constitutional: Negative for fever.  Eyes: Positive for pain and itching. Negative for blurred vision, double vision, photophobia, discharge, redness and visual disturbance.  Gastrointestinal: Negative for nausea and vomiting.  Skin: Negative for rash.     Objective:   BP 112/68 (BP Location: Left Arm, Patient Position: Sitting, Cuff Size: Large)   Pulse 75   Temp 98.9 F (37.2 C) (Temporal)   Resp 12   Ht 5' 6.5" (1.689 m)   Wt 203 lb 1.9 oz (92.1  kg)   SpO2 96% Comment: room air  BMI 32.29 kg/m   Physical Exam  Constitutional: He appears well-developed and well-nourished.  HENT:  Head: Normocephalic and atraumatic.  Eyes: Pupils are equal, round, and reactive to light. EOM and lids are normal. Right eye exhibits no discharge and no exudate. No foreign body  present in the right eye. Left eye exhibits no chemosis, no discharge and no exudate. Right conjunctiva is injected. Right conjunctiva has no hemorrhage. Left conjunctiva is not injected. Left conjunctiva has no hemorrhage. No scleral icterus.  Mild conjunctival injection on the right side.  No foreign body observed.  No periorbital edema.  No periorbital tenderness to palpation.  No skin irritation or abnormality associated with his eyes.  Vitals reviewed.    Assessment and Plan  1. Irritation of right eye Patient with irritation and problems with right eye after getting toothpaste in his eye.  Suspect chemical irritation/abrasion of conjunctiva or cornea.  Will send to ophthalmology for evaluation.  Patient was scheduled for an appointment with ophthalmology and given the appointment information while he was in the office today. Keep follow-up with PCP for regularly scheduled visits. No follow-ups on file. Caren Macadam, MD 11/19/2017

## 2017-12-29 ENCOUNTER — Ambulatory Visit (INDEPENDENT_AMBULATORY_CARE_PROVIDER_SITE_OTHER): Payer: BLUE CROSS/BLUE SHIELD | Admitting: Family Medicine

## 2017-12-29 ENCOUNTER — Encounter: Payer: Self-pay | Admitting: Family Medicine

## 2017-12-29 VITALS — BP 120/76 | HR 82 | Resp 12 | Ht 66.5 in | Wt 210.1 lb

## 2017-12-29 DIAGNOSIS — I10 Essential (primary) hypertension: Secondary | ICD-10-CM | POA: Diagnosis not present

## 2017-12-29 DIAGNOSIS — Z23 Encounter for immunization: Secondary | ICD-10-CM | POA: Diagnosis not present

## 2017-12-29 DIAGNOSIS — E669 Obesity, unspecified: Secondary | ICD-10-CM | POA: Diagnosis not present

## 2017-12-29 DIAGNOSIS — E785 Hyperlipidemia, unspecified: Secondary | ICD-10-CM

## 2017-12-29 DIAGNOSIS — J438 Other emphysema: Secondary | ICD-10-CM

## 2017-12-29 NOTE — Patient Instructions (Signed)
Physical exam May 30, 2018,  With shingrix, call if you need me before  Fasting lipid, cmp and EgFR  This week, as soon as possible Commit to change  in food choice, and daily exercise to facitaste improved heal;th and weight loss.  Consider EAP, just dealing with life is a challenge  Blood pressure is very good  Flu vaccine today  Pls stop e cigarettes  Thank you  for choosing [facility]. We consider it a privelige to serve you.  Delivering excellent health care in a caring and  compassionate way is our goal.  Partnering with you,  so that together we can achieve this goal is our strategy.

## 2017-12-29 NOTE — Assessment & Plan Note (Signed)
Hyperlipidemia:Low fat diet discussed and encouraged. Updated lab needed at/ before next visit.    Lipid Panel  Lab Results  Component Value Date   CHOL 137 05/27/2017   HDL 30 (L) 05/27/2017   LDLCALC 87 05/27/2017   TRIG 108 05/27/2017   CHOLHDL 4.6 05/27/2017

## 2017-12-29 NOTE — Assessment & Plan Note (Signed)
After obtaining informed consent, the vaccine is  administered by LPN.  

## 2017-12-29 NOTE — Assessment & Plan Note (Signed)
Deteriorated. Patient re-educated about  the importance of commitment to a  minimum of 150 minutes of exercise per week.  The importance of healthy food choices with portion control discussed. Encouraged to start a food diary, count calories and to consider  joining a support group. Sample diet sheets offered. Goals set by the patient for the next several months.   Weight /BMI 12/29/2017 11/19/2017 10/29/2017  WEIGHT 210 lb 1.9 oz 203 lb 1.9 oz 200 lb  HEIGHT 5' 6.5" 5' 6.5" 5' 6.5"  BMI 33.41 kg/m2 32.29 kg/m2 31.8 kg/m2

## 2017-12-29 NOTE — Assessment & Plan Note (Signed)
Controlled, no change in medication DASH diet and commitment to daily physical activity for a minimum of 30 minutes discussed and encouraged, as a part of hypertension management. The importance of attaining a healthy weight is also discussed.  BP/Weight 12/29/2017 11/19/2017 10/29/2017 09/08/2017 05/27/2017 11/11/2016 0/11/2328  Systolic BP 076 226 97 333 545 625 638  Diastolic BP 76 68 71 84 82 80 80  Wt. (Lbs) 210.12 203.12 200 - 199 201 199.4  BMI 33.41 32.29 31.8 - 31.17 31.48 31.23

## 2017-12-29 NOTE — Assessment & Plan Note (Signed)
Progressively worsening due to e cigarette use

## 2017-12-29 NOTE — Progress Notes (Signed)
Todd Randall     MRN: [id number]      DOB: 1955/07/21   HPI Todd Randall is here for follow up and re-evaluation of chronic medical conditions, medication management and review of any available recent lab and radiology data.  Preventive health is updated, specifically  Cancer screening and Immunization.    The PT denies any adverse reactions to current medications since the last visit.  There are no new concerns.  There are no specific complaints   ROS Denies recent fever or chills. Denies sinus pressure, nasal congestion, ear pain or sore throat. Denies chest congestion, productive cough or wheezing. Denies chest pains, palpitations and leg swelling Denies abdominal pain, nausea, vomiting,diarrhea or constipation.   Denies dysuria, frequency, hesitancy or incontinence. Denies joint pain, swelling and limitation in mobility. Denies headaches, seizures, numbness, or tingling. Denies depression does have  anxiety denies  insomnia. Denies skin break down or rash.   PE  BP 120/76 (BP Location: Right Arm, Patient Position: Sitting, Cuff Size: Large)   Pulse 82   Resp 12   Ht 5' 6.5" (1.689 m)   Wt 210 lb 1.9 oz (95.3 kg)   SpO2 98% Comment: room air  BMI 33.41 kg/m   Patient alert and oriented and in no cardiopulmonary distress.  HEENT: No facial asymmetry, EOMI,   oropharynx pink and moist.  Neck supple no JVD, no mass.  Chest: Clear to auscultation bilaterally.Decreased air entry  CVS: S1, S2 no murmurs, no S3.Regular rate.  ABD: Soft non tender.   Ext: No edema  MS: Adequate ROM spine, shoulders, hips and knees.  Skin: Intact, no ulcerations or rash noted.  Psych: Good eye contact, normal affect. Memory intact  anxious not depressed appearing.  CNS: CN 2-12 intact, power,  normal throughout.no focal deficits noted.   Assessment & Plan  Essential hypertension Controlled, no change in medication DASH diet and commitment to daily physical activity for a minimum  of 30 minutes discussed and encouraged, as a part of hypertension management. The importance of attaining a healthy weight is also discussed.  BP/Weight 12/29/2017 11/19/2017 10/29/2017 09/08/2017 05/27/2017 11/11/2016 0/63/0160  Systolic BP 109 323 97 557 322 025 427  Diastolic BP 76 68 71 84 82 80 80  Wt. (Lbs) 210.12 203.12 200 - 199 201 199.4  BMI 33.41 32.29 31.8 - 31.17 31.48 31.23       Obesity (BMI 30.0-34.9) Deteriorated. Patient re-educated about  the importance of commitment to a  minimum of 150 minutes of exercise per week.  The importance of healthy food choices with portion control discussed. Encouraged to start a food diary, count calories and to consider  joining a support group. Sample diet sheets offered. Goals set by the patient for the next several months.   Weight /BMI 12/29/2017 11/19/2017 10/29/2017  WEIGHT 210 lb 1.9 oz 203 lb 1.9 oz 200 lb  HEIGHT 5' 6.5" 5' 6.5" 5' 6.5"  BMI 33.41 kg/m2 32.29 kg/m2 31.8 kg/m2      Hyperlipidemia LDL goal <130 Hyperlipidemia:Low fat diet discussed and encouraged. Updated lab needed at/ before next visit.    Lipid Panel  Lab Results  Component Value Date   CHOL 137 05/27/2017   HDL 30 (L) 05/27/2017   LDLCALC 87 05/27/2017   TRIG 108 05/27/2017   CHOLHDL 4.6 05/27/2017       COPD with emphysema Progressively worsening due to e cigarette use  Need for immunization against influenza After obtaining informed consent, the vaccine is  administered by LPN.

## 2018-01-01 LAB — COMPLETE METABOLIC PANEL WITH GFR
AG Ratio: 1.7 (calc) (ref 1.0–2.5)
ALT: 16 U/L (ref 9–46)
AST: 21 U/L (ref 10–35)
Albumin: 4.8 g/dL (ref 3.6–5.1)
Alkaline phosphatase (APISO): 57 U/L (ref 40–115)
BUN: 16 mg/dL (ref 7–25)
CO2: 29 mmol/L (ref 20–32)
Calcium: 10.2 mg/dL (ref 8.6–10.3)
Chloride: 100 mmol/L (ref 98–110)
Creat: 0.96 mg/dL (ref 0.70–1.25)
GFR, Est African American: 99 mL/min/{1.73_m2} (ref >=60)
GFR, Est Non African American: 86 mL/min/{1.73_m2} (ref >=60)
Globulin: 2.9 g/dL (calc) (ref 1.9–3.7)
Glucose, Bld: 82 mg/dL (ref 65–99)
Potassium: 4.7 mmol/L (ref 3.5–5.3)
Sodium: 139 mmol/L (ref 135–146)
Total Bilirubin: 0.6 mg/dL (ref 0.2–1.2)
Total Protein: 7.7 g/dL (ref 6.1–8.1)

## 2018-01-01 LAB — LIPID PANEL
Cholesterol: 189 mg/dL (ref <200)
HDL: 43 mg/dL (ref >40)
LDL Cholesterol (Calc): 120 mg/dL (calc) — ABNORMAL HIGH
Non-HDL Cholesterol (Calc): 146 mg/dL (calc) — ABNORMAL HIGH (ref <130)
Total CHOL/HDL Ratio: 4.4 (calc) (ref <5.0)
Triglycerides: 147 mg/dL (ref <150)

## 2018-01-07 ENCOUNTER — Encounter: Payer: Self-pay | Admitting: Family Medicine

## 2018-01-09 ENCOUNTER — Encounter: Payer: Self-pay | Admitting: Family Medicine

## 2018-01-19 ENCOUNTER — Other Ambulatory Visit: Payer: Self-pay | Admitting: Family Medicine

## 2018-01-19 DIAGNOSIS — I10 Essential (primary) hypertension: Secondary | ICD-10-CM

## 2018-02-06 ENCOUNTER — Other Ambulatory Visit: Payer: Self-pay | Admitting: Family Medicine

## 2018-02-06 DIAGNOSIS — J4530 Mild persistent asthma, uncomplicated: Secondary | ICD-10-CM

## 2018-02-27 ENCOUNTER — Other Ambulatory Visit: Payer: Self-pay | Admitting: Family Medicine

## 2018-06-02 ENCOUNTER — Other Ambulatory Visit: Payer: Self-pay

## 2018-06-02 ENCOUNTER — Ambulatory Visit (INDEPENDENT_AMBULATORY_CARE_PROVIDER_SITE_OTHER): Payer: BLUE CROSS/BLUE SHIELD | Admitting: Family Medicine

## 2018-06-02 ENCOUNTER — Encounter: Payer: Self-pay | Admitting: Family Medicine

## 2018-06-02 VITALS — BP 118/70 | HR 86 | Resp 12 | Ht 66.5 in | Wt 202.1 lb

## 2018-06-02 DIAGNOSIS — Z0000 Encounter for general adult medical examination without abnormal findings: Secondary | ICD-10-CM | POA: Diagnosis not present

## 2018-06-02 DIAGNOSIS — I10 Essential (primary) hypertension: Secondary | ICD-10-CM | POA: Diagnosis not present

## 2018-06-02 DIAGNOSIS — M1000 Idiopathic gout, unspecified site: Secondary | ICD-10-CM

## 2018-06-02 DIAGNOSIS — Z23 Encounter for immunization: Secondary | ICD-10-CM | POA: Diagnosis not present

## 2018-06-02 DIAGNOSIS — E785 Hyperlipidemia, unspecified: Secondary | ICD-10-CM | POA: Diagnosis not present

## 2018-06-02 DIAGNOSIS — E669 Obesity, unspecified: Secondary | ICD-10-CM

## 2018-06-02 DIAGNOSIS — Z789 Other specified health status: Secondary | ICD-10-CM

## 2018-06-02 DIAGNOSIS — Z720 Tobacco use: Secondary | ICD-10-CM

## 2018-06-02 MED ORDER — PRAVASTATIN SODIUM 10 MG PO TABS: 10.0000 mg | ORAL_TABLET | Freq: Every day | ORAL | 3 refills | Status: DC

## 2018-06-02 MED ORDER — CLONIDINE HCL 0.1 MG PO TABS: 0.1000 mg | ORAL_TABLET | Freq: Three times a day (TID) | ORAL | 3 refills | Status: DC

## 2018-06-02 MED ORDER — AMLODIPINE BESYLATE 10 MG PO TABS: 10.0000 mg | ORAL_TABLET | Freq: Every day | ORAL | 3 refills | Status: DC

## 2018-06-02 NOTE — Assessment & Plan Note (Signed)
Improved.  Patient re-educated about  the importance of commitment to a  minimum of 150 minutes of exercise per week as able.  The importance of healthy food choices with portion control discussed, as well as eating regularly and within a 12 hour window most days. The need to choose "clean , green" food 50 to 75% of the time is discussed, as well as to make water the primary drink and set a goal of 64 ounces water daily.  Encouraged to start a food diary,  and to consider  joining a support group. Sample diet sheets offered. Goals set by the patient for the next several months.   Weight /BMI 06/02/2018 12/29/2017 11/19/2017  WEIGHT 202 lb 1.9 oz 210 lb 1.9 oz 203 lb 1.9 oz  HEIGHT 5' 6.5" 5' 6.5" 5' 6.5"  BMI 32.13 kg/m2 33.41 kg/m2 32.29 kg/m2

## 2018-06-02 NOTE — Patient Instructions (Addendum)
MD follow up in November, call if you need me before  Please work    Shingrix # 1 today   Fasting cBC, lipid, cmp and EGFr , uric acid level and tSH in 2 months  Nurse to schedule appt for shingrix # 2   It is important that you exercise regularly at least 30 minutes 5 times a week. If you develop chest pain, have severe difficulty breathing, or feel very tired, stop exercising immediately and seek medical attention  Think about what you will eat, plan ahead. Choose " clean, green, fresh or frozen" over canned, processed or packaged foods which are more sugary, salty and fatty. 70 to 75% of food eaten should be vegetables and fruit. Three meals at set times with snacks allowed between meals, but they must be fruit or vegetables. Aim to eat over a 12 hour period , example 7 am to 7 pm, and STOP after  your last meal of the day. Drink water,generally about 64 ounces per day, no other drink is as healthy. Fruit juice is best enjoyed in a healthy way, by EATING the fruit.   Please work on 6 pound weight loss  Please schedule f/u with Urology

## 2018-06-02 NOTE — Assessment & Plan Note (Signed)
Asked:confirms currently vapes Assess: Unwilling to quit but cutting back Advise: needs to QUIT to reduce risk of cancer, cardio and cerebrovascular disease Assist: counseled for 5 minutes and literature provided Arrange: follow up in 3 months

## 2018-06-02 NOTE — Assessment & Plan Note (Signed)
After obtaining informed consent, the vaccine is  administered , with no adverse effect noted at the time of administration.  

## 2018-06-02 NOTE — Assessment & Plan Note (Signed)
Annual exam as documented. Counseling done  re healthy lifestyle involving commitment to 150 minutes exercise per week, heart healthy diet, and attaining healthy weight.The importance of adequate sleep also discussed. Changes in health habits are decided on by the patient with goals and time frames  set for achieving them. Immunization and cancer screening needs are specifically addressed at this visit. 

## 2018-06-02 NOTE — Progress Notes (Signed)
   Todd Randall     MRN: [id number]      DOB: 10/12/1955   HPI: Patient is in for annual physical exam. Needs to be followed by Urology for his prostate Not willing to committing to quitting vaping, but re educated re the need to do so No regular exercise , encouraged to commit to 150 mins/ week Recent labs, if available are reviewed. Immunization is reviewed , and  updated   PE;  BP 118/70   Pulse 86   Resp 12   Ht 5' 6.5" (1.689 m)   Wt 202 lb 1.9 oz (91.7 kg)   SpO2 97% Comment: room air  BMI 32.13 kg/m   Pleasant male, alert and oriented x 3, in no cardio-pulmonary distress. Afebrile. HEENT No facial trauma or asymetry. Sinuses non tender. EOMI,t. External ears normal, tympanic membranes clear. Oropharynx moist, no exudate. Neck: supple, no adenopathy,JVD or thyromegaly.No bruits.  Chest: Clear to ascultation bilaterally.No crackles or wheezes.Decreased air entry Non tender to palpation    Cardiovascular system; Heart sounds normal,  S1 and  S2 ,no S3.  No murmur, or thrill. Apical beat not displaced Peripheral pulses normal.  Abdomen: Soft, non tender, no organomegaly or masses. No bruits. Bowel sounds normal. No guarding, tenderness or rebound.     Musculoskeletal exam: Full ROM of spine, hips , shoulders and knees. No deformity ,swelling or crepitus noted. No muscle wasting or atrophy.   Neurologic: Cranial nerves 2 to 12 intact. Power,  normal throughout. No disturbance in gait. No tremor.  Skin: Intact, no ulceration, erythema , scaling or rash noted. Pigmentation normal throughout  Psych; Normal mood and affect. Judgement and concentration normal   Assessment & Plan:  Annual physical exam Annual exam as documented. Counseling done  re healthy lifestyle involving commitment to 150 minutes exercise per week, heart healthy diet, and attaining healthy weight.The importance of adequate sleep also discussed.  Changes in health habits are  decided on by the patient with goals and time frames  set for achieving them. Immunization and cancer screening needs are specifically addressed at this visit.   Need for shingles vaccine After obtaining informed consent, the vaccine is  administered , with no adverse effect noted at the time of administration.   Obesity (BMI 30.0-34.9) Improved.  Patient re-educated about  the importance of commitment to a  minimum of 150 minutes of exercise per week as able.  The importance of healthy food choices with portion control discussed, as well as eating regularly and within a 12 hour window most days. The need to choose "clean , green" food 50 to 75% of the time is discussed, as well as to make water the primary drink and set a goal of 64 ounces water daily.  Encouraged to start a food diary,  and to consider  joining a support group. Sample diet sheets offered. Goals set by the patient for the next several months.   Weight /BMI 06/02/2018 12/29/2017 11/19/2017  WEIGHT 202 lb 1.9 oz 210 lb 1.9 oz 203 lb 1.9 oz  HEIGHT 5' 6.5" 5' 6.5" 5' 6.5"  BMI 32.13 kg/m2 33.41 kg/m2 32.29 kg/m2      Nicotine vapor product user Asked:confirms currently vapes Assess: Unwilling to quit but cutting back Advise: needs to QUIT to reduce risk of cancer, cardio and cerebrovascular disease Assist: counseled for 5 minutes and literature provided Arrange: follow up in 3 months

## 2018-06-07 ENCOUNTER — Other Ambulatory Visit: Payer: Self-pay | Admitting: Family Medicine

## 2018-06-07 MED ORDER — CLONIDINE HCL 0.1 MG PO TABS: 0.1000 mg | ORAL_TABLET | Freq: Every day | ORAL | 3 refills | Status: DC

## 2018-08-23 ENCOUNTER — Ambulatory Visit: Payer: BLUE CROSS/BLUE SHIELD

## 2018-08-24 ENCOUNTER — Ambulatory Visit (INDEPENDENT_AMBULATORY_CARE_PROVIDER_SITE_OTHER): Payer: BLUE CROSS/BLUE SHIELD

## 2018-08-24 ENCOUNTER — Other Ambulatory Visit: Payer: Self-pay

## 2018-08-24 DIAGNOSIS — Z23 Encounter for immunization: Secondary | ICD-10-CM

## 2018-08-24 NOTE — Progress Notes (Signed)
Patient given 2nd Shingrix in left deltoid with no immediate problems

## 2018-09-10 ENCOUNTER — Other Ambulatory Visit: Payer: Self-pay

## 2018-09-10 ENCOUNTER — Other Ambulatory Visit: Payer: Self-pay | Admitting: Internal Medicine

## 2018-09-10 DIAGNOSIS — Z20822 Contact with and (suspected) exposure to covid-19: Secondary | ICD-10-CM

## 2018-09-16 LAB — NOVEL CORONAVIRUS, NAA: SARS-CoV-2, NAA: NOT DETECTED

## 2018-09-17 ENCOUNTER — Encounter: Payer: Self-pay | Admitting: Family Medicine

## 2018-09-21 ENCOUNTER — Telehealth (INDEPENDENT_AMBULATORY_CARE_PROVIDER_SITE_OTHER): Payer: BLUE CROSS/BLUE SHIELD | Admitting: Family Medicine

## 2018-09-21 ENCOUNTER — Encounter: Payer: Self-pay | Admitting: Family Medicine

## 2018-09-21 ENCOUNTER — Other Ambulatory Visit: Payer: Self-pay

## 2018-09-21 VITALS — BP 118/70 | Ht 66.5 in | Wt 202.0 lb

## 2018-09-21 DIAGNOSIS — I10 Essential (primary) hypertension: Secondary | ICD-10-CM

## 2018-09-21 DIAGNOSIS — M10042 Idiopathic gout, left hand: Secondary | ICD-10-CM | POA: Diagnosis not present

## 2018-09-21 MED ORDER — INDOMETHACIN 50 MG PO CAPS: ORAL_CAPSULE | ORAL | 0 refills | Status: DC

## 2018-09-21 MED ORDER — PREDNISONE 5 MG (21) PO TBPK: 5.0000 mg | ORAL_TABLET | ORAL | 0 refills | Status: DC

## 2018-09-21 NOTE — Progress Notes (Signed)
Virtual Visit via Telephone Note  I connected with Todd Randall on 09/21/18 at  1:00 PM EDT by telephone and verified that I am speaking with the correct person using two identifiers.  Location: Patient: work Provider: office   I discussed the limitations, risks, security and privacy concerns of performing an evaluation and management service by telephone and the availability of in person appointments. I also discussed with the patient that there may be a patient responsible charge related to this service. The patient expressed understanding and agreed to proceed.   History of Present Illness: 5 day h/o pain and swelling of knuckles of left hand esp the 2nd, 3rd and 4th fingers Had indomethacin left over which helped a lot. No trauma to hand, has been off of allopurinol for over 1 year   Observations/Objective: BP 118/70   Ht 5' 6.5" (1.689 m)   Wt 202 lb (91.6 kg)   BMI 32.12 kg/m  Good communication with no confusion and intact memory. Alert and oriented x 3 No signs of respiratory distress during speech    Assessment and Plan:  Gout 5 day flare left hand Check uric acid level, and short course prednisone and indomethacin  Essential hypertension Controlled, no change in medication DASH diet and commitment to daily physical activity for a minimum of 30 minutes discussed and encouraged, as a part of hypertension management. The importance of attaining a healthy weight is also discussed.  BP/Weight 09/21/2018 06/02/2018 12/29/2017 11/19/2017 10/29/2017 09/08/2017 05/10/8248  Systolic BP 037 048 889 169 97 450 388  Diastolic BP 70 70 76 68 71 84 82  Wt. (Lbs) 202 202.12 210.12 203.12 200 - 199  BMI 32.12 32.13 33.41 32.29 31.8 - 31.17        Follow Up Instructions:    I discussed the assessment and treatment plan with the patient. The patient was provided an opportunity to ask questions and all were answered. The patient agreed with the plan and demonstrated an understanding  of the instructions.   The patient was advised to call back or seek an in-person evaluation if the symptoms worsen or if the condition fails to improve as anticipated.  I provided 12 minutes of non-face-to-face time during this encounter.   Tula Nakayama, MD

## 2018-09-21 NOTE — Patient Instructions (Addendum)
F/u as before , call if you need me sooner  Indomethacin and prednisone are sent to your local pharmacy for acute gout flare  Please get uric acid level drawn at [facility] lab this week, order is sent  [facility] you feel better soon  Thanks for choosing [facility], we consider it a privelige to serve you.

## 2018-09-21 NOTE — Assessment & Plan Note (Signed)
Controlled, no change in medication DASH diet and commitment to daily physical activity for a minimum of 30 minutes discussed and encouraged, as a part of hypertension management. The importance of attaining a healthy weight is also discussed.  BP/Weight 09/21/2018 06/02/2018 12/29/2017 11/19/2017 10/29/2017 09/08/2017 8/36/6294  Systolic BP 765 465 035 465 97 681 275  Diastolic BP 70 70 76 68 71 84 82  Wt. (Lbs) 202 202.12 210.12 203.12 200 - 199  BMI 32.12 32.13 33.41 32.29 31.8 - 31.17

## 2018-09-21 NOTE — Addendum Note (Signed)
Addended by: Obie Dredge A on: 09/21/2018 01:44 PM   Modules accepted: Orders

## 2018-09-21 NOTE — Assessment & Plan Note (Signed)
5 day flare left hand Check uric acid level, and short course prednisone and indomethacin

## 2018-10-06 ENCOUNTER — Ambulatory Visit (INDEPENDENT_AMBULATORY_CARE_PROVIDER_SITE_OTHER): Payer: BLUE CROSS/BLUE SHIELD | Admitting: Family Medicine

## 2018-10-06 ENCOUNTER — Other Ambulatory Visit: Payer: Self-pay

## 2018-10-06 ENCOUNTER — Encounter: Payer: Self-pay | Admitting: Family Medicine

## 2018-10-06 VITALS — BP 112/80 | HR 81 | Temp 98.6°F | Resp 15 | Ht 66.5 in | Wt 216.4 lb

## 2018-10-06 DIAGNOSIS — M79641 Pain in right hand: Secondary | ICD-10-CM

## 2018-10-06 DIAGNOSIS — E790 Hyperuricemia without signs of inflammatory arthritis and tophaceous disease: Secondary | ICD-10-CM | POA: Diagnosis not present

## 2018-10-06 DIAGNOSIS — I10 Essential (primary) hypertension: Secondary | ICD-10-CM | POA: Diagnosis not present

## 2018-10-06 DIAGNOSIS — E669 Obesity, unspecified: Secondary | ICD-10-CM | POA: Diagnosis not present

## 2018-10-06 HISTORY — DX: Pain in right hand: M79.641

## 2018-10-06 MED ORDER — KETOROLAC TROMETHAMINE 60 MG/2ML IM SOLN
60.0000 mg | Freq: Once | INTRAMUSCULAR | Status: AC
  Administered 2018-10-06: 60 mg via INTRAMUSCULAR

## 2018-10-06 MED ORDER — PREDNISONE 20 MG PO TABS: ORAL_TABLET | ORAL | 0 refills | Status: DC

## 2018-10-06 MED ORDER — METHYLPREDNISOLONE ACETATE 80 MG/ML IJ SUSP
80.0000 mg | Freq: Once | INTRAMUSCULAR | Status: AC
  Administered 2018-10-06: 80 mg via INTRAMUSCULAR

## 2018-10-06 NOTE — Assessment & Plan Note (Signed)
Uncontrolled.Toradol and depo medrol administered IM in the office , to be followed by a short course of oral prednisone   

## 2018-10-06 NOTE — Patient Instructions (Addendum)
F/U as before call if you need me sooner  You are  Receiving injections of toradol 60 mg and depo Medrol 80 mg IM in the office today and short course of prednisone20 mg is prescribed in tapering dose  Pls get uric acid level drawn in 4weeks, this is already ordered

## 2018-10-10 ENCOUNTER — Encounter: Payer: Self-pay | Admitting: Family Medicine

## 2018-10-10 NOTE — Assessment & Plan Note (Signed)
H/o hyperuricemia with current swelling of joins of right hand, update uric acid level

## 2018-10-10 NOTE — Progress Notes (Signed)
   Todd Randall     MRN: [id number]      DOB: Mar 08, 1956   HPI Todd Randall is here for follow up intermittent right hand pain x 1 month, no trauma, h/o gout, knuckle remains swollen despite recent prednisone tape, states redness and warmth and pain are somewhat improved , but not resolved C/o weigh gain from poor eating and house confinement ROS Denies recent fever or chills. Denies sinus pressure, nasal congestion, ear pain or sore throat. Denies chest congestion, productive cough or wheezing. Denies chest pains, palpitations and leg swelling Denies abdominal pain, nausea, vomiting,diarrhea or constipation.   Denies dysuria, frequency, hesitancy or incontinence.  Denies headaches, seizures, numbness, or tingling. Denies depression, anxiety or insomnia. Denies skin break down or rash.   PE  BP 112/80   Pulse 81   Temp 98.6 F (37 C) (Temporal)   Resp 15   Ht 5' 6.5" (1.689 m)   Wt 216 lb 6.4 oz (98.2 kg)   SpO2 97%   BMI 34.40 kg/m   Patient alert and oriented and in no cardiopulmonary distress.  HEENT: No facial asymmetry, EOMI,   oropharynx pink and moist.  Neck supple no JVD, no mass.  Chest: Clear to auscultation bilaterally.  CVS: S1, S2 no murmurs, no S3.Regular rate.  ABD: Soft non tender.   Ext: No edema  MS: Adequate ROM spine, shoulders, hips and knees. Dorsum of right hand swollen and deformed at MP joints , no warmth or erythema, decreased ROM Skin: Intact, no ulcerations or rash noted.  Psych: Good eye contact, normal affect. Memory intact not anxious or depressed appearing.  CNS: CN 2-12 intact, power,  normal throughout.no focal deficits noted.   Assessment & Plan  Hand pain, right Uncontrolled.Toradol and depo medrol administered IM in the office , to be followed by a short course of oral prednisone .   Gout H/o hyperuricemia with current swelling of joins of right hand, update uric acid level  Essential hypertension Controlled, no change  in medication DASH diet and commitment to daily physical activity for a minimum of 30 minutes discussed and encouraged, as a part of hypertension management. The importance of attaining a healthy weight is also discussed.  BP/Weight 10/06/2018 09/21/2018 06/02/2018 12/29/2017 11/19/2017 10/29/2017 0/99/8338  Systolic BP 250 539 767 341 937 97 902  Diastolic BP 80 70 70 76 68 71 84  Wt. (Lbs) 216.4 202 202.12 210.12 203.12 200 -  BMI 34.4 32.12 32.13 33.41 32.29 31.8 -       Obesity (BMI 30.0-34.9)  Patient re-educated about  the importance of commitment to a  minimum of 150 minutes of exercise per week as able.  The importance of healthy food choices with portion control discussed, as well as eating regularly and within a 12 hour window most days. The need to choose "clean , green" food 50 to 75% of the time is discussed, as well as to make water the primary drink and set a goal of 64 ounces water daily.    Weight /BMI 10/06/2018 09/21/2018 06/02/2018  WEIGHT 216 lb 6.4 oz 202 lb 202 lb 1.9 oz  HEIGHT 5' 6.5" 5' 6.5" 5' 6.5"  BMI 34.4 kg/m2 32.12 kg/m2 32.13 kg/m2

## 2018-10-10 NOTE — Assessment & Plan Note (Signed)
  Patient re-educated about  the importance of commitment to a  minimum of 150 minutes of exercise per week as able.  The importance of healthy food choices with portion control discussed, as well as eating regularly and within a 12 hour window most days. The need to choose "clean , green" food 50 to 75% of the time is discussed, as well as to make water the primary drink and set a goal of 64 ounces water daily.    Weight /BMI 10/06/2018 09/21/2018 06/02/2018  WEIGHT 216 lb 6.4 oz 202 lb 202 lb 1.9 oz  HEIGHT 5' 6.5" 5' 6.5" 5' 6.5"  BMI 34.4 kg/m2 32.12 kg/m2 32.13 kg/m2

## 2018-10-10 NOTE — Assessment & Plan Note (Signed)
Controlled, no change in medication DASH diet and commitment to daily physical activity for a minimum of 30 minutes discussed and encouraged, as a part of hypertension management. The importance of attaining a healthy weight is also discussed.  BP/Weight 10/06/2018 09/21/2018 06/02/2018 12/29/2017 11/19/2017 10/29/2017 0/56/9794  Systolic BP 801 655 374 827 078 97 675  Diastolic BP 80 70 70 76 68 71 84  Wt. (Lbs) 216.4 202 202.12 210.12 203.12 200 -  BMI 34.4 32.12 32.13 33.41 32.29 31.8 -

## 2018-11-20 ENCOUNTER — Other Ambulatory Visit: Payer: Self-pay | Admitting: Family Medicine

## 2018-11-20 DIAGNOSIS — I10 Essential (primary) hypertension: Secondary | ICD-10-CM

## 2018-12-13 ENCOUNTER — Other Ambulatory Visit: Payer: Self-pay

## 2018-12-13 ENCOUNTER — Ambulatory Visit (INDEPENDENT_AMBULATORY_CARE_PROVIDER_SITE_OTHER): Payer: BLUE CROSS/BLUE SHIELD

## 2018-12-13 DIAGNOSIS — Z23 Encounter for immunization: Secondary | ICD-10-CM | POA: Diagnosis not present

## 2019-02-01 ENCOUNTER — Telehealth (INDEPENDENT_AMBULATORY_CARE_PROVIDER_SITE_OTHER): Payer: Self-pay | Admitting: Family Medicine

## 2019-02-01 ENCOUNTER — Encounter: Payer: Self-pay | Admitting: Family Medicine

## 2019-02-01 ENCOUNTER — Other Ambulatory Visit: Payer: Self-pay

## 2019-02-01 VITALS — BP 112/80 | Ht 66.5 in | Wt 219.2 lb

## 2019-02-01 DIAGNOSIS — I10 Essential (primary) hypertension: Secondary | ICD-10-CM

## 2019-02-01 DIAGNOSIS — Z125 Encounter for screening for malignant neoplasm of prostate: Secondary | ICD-10-CM

## 2019-02-01 DIAGNOSIS — Z87891 Personal history of nicotine dependence: Secondary | ICD-10-CM

## 2019-02-01 DIAGNOSIS — Z720 Tobacco use: Secondary | ICD-10-CM

## 2019-02-01 DIAGNOSIS — E785 Hyperlipidemia, unspecified: Secondary | ICD-10-CM

## 2019-02-01 DIAGNOSIS — E66811 Obesity, class 1: Secondary | ICD-10-CM

## 2019-02-01 DIAGNOSIS — E669 Obesity, unspecified: Secondary | ICD-10-CM

## 2019-02-01 NOTE — Patient Instructions (Addendum)
Annual physical exam last week in March, call if you need me before Fasting CBC, lipid, cmp and EGF, TSH,  and PSA at [facility] this week    It is important that you exercise regularly at least 30 minutes 5 times a week. If you develop chest pain, have severe difficulty breathing, or feel very tired, stop exercising immediately and seek medical attention   Think about what you will eat, plan ahead. Choose " clean, green, fresh or frozen" over canned, processed or packaged foods which are more sugary, salty and fatty. 70 to 75% of food eaten should be vegetables and fruit. Three meals at set times with snacks allowed between meals, but they must be fruit or vegetables. Aim to eat over a 12 hour period , example 7 am to 7 pm, and STOP after  your last meal of the day. Drink water,generally about 64 ounces per day, no other drink is as healthy. Fruit juice is best enjoyed in a healthy way, by EATING the fruit.  SCREENING CHEST SCAN BASED ON SMOKING HISTORY, YOU ARE ENCOURAGED TO STOP VAPING ALSO SO NO POTENTIAL DAMAGE TO LUNGS IS ONGOING, THIS TEST IS ORDERED, PLEASE FOLLOW THROUGH AS DISCUSSED  Thanks for choosing [facility], we consider it a privelige to serve you.

## 2019-02-01 NOTE — Progress Notes (Signed)
Virtual Visit via Telephone Note  I connected with Todd Randall on 02/01/19 at  8:00 AM EST by telephone and verified that I am speaking with the correct person using two identifiers.  Location: Patient: home Provider: office   I discussed the limitations, risks, security and privacy concerns of performing an evaluation and management service by telephone and the availability of in person appointments. I also discussed with the patient that there may be a patient responsible charge related to this service. The patient expressed understanding and agreed to proceed.   History of Present Illness: F/u chronic problems. Intermittent RLQ pain lasts less than 5 mins, can stretch it out, once per month, up to a 6, for approx 5 months.No change in BM, which are regular Denies recent fever or chills. Denies sinus pressure, nasal congestion, ear pain or sore throat. Denies chest congestion, productive cough or wheezing. Denies chest pains, palpitations and leg swelling Denies abdominal pain, nausea, vomiting,diarrhea or constipation.   Denies dysuria, frequency, hesitancy or incontinence. Denies joint pain, swelling and limitation in mobility. Denies headaches, seizures, numbness, or tingling. Denies depression, anxiety or insomnia. Denies skin break down or rash.       Observations/Objective: BP 112/80   Ht 5' 6.5" (1.689 m)   Wt 219 lb 3.2 oz (99.4 kg)   BMI 34.85 kg/m   Good communication with no confusion and intact memory. Alert and oriented x 3 No signs of respiratory distress during speech    Assessment and Plan: Essential hypertension Controlled, no change in medication DASH diet and commitment to daily physical activity for a minimum of 30 minutes discussed and encouraged, as a part of hypertension management. The importance of attaining a healthy weight is also discussed.  BP/Weight 02/01/2019 10/06/2018 09/21/2018 06/02/2018 12/29/2017 11/19/2017 [id number]  Systolic BP [id number]  [id number] 123456 123456 123456 [id number] 97  Diastolic BP 80 80 70 70 76 68 71  Wt. (Lbs) 219.2 216.4 202 202.12 210.12 203.12 200  BMI 34.85 34.4 32.12 32.13 33.41 32.29 31.8       H/O nicotine dependence 3 4 pack year smoking history, quit in 2014, currently vaping , needs lung cancer screening   Nicotine vapor product user Asked:confirms currently vapes, s Assess: Unwilling to quit but cutting back Advise: needs to QUIT to reduce risk of cancer, cardio and cerebrovascular disease Assist: counseled for 5 minutes  Arrange: follow up in 3 months   Obesity (BMI 30.0-34.9)  Patient re-educated about  the importance of commitment to a  minimum of 150 minutes of exercise per week as able.  The importance of healthy food choices with portion control discussed, as well as eating regularly and within a 12 hour window most days. The need to choose "clean , green" food 50 to 75% of the time is discussed, as well as to make water the primary drink and set a goal of 64 ounces water daily.    Weight /BMI 02/01/2019 10/06/2018 09/21/2018  WEIGHT 219 lb 3.2 oz 216 lb 6.4 oz 202 lb  HEIGHT 5' 6.5" 5' 6.5" 5' 6.5"  BMI 34.85 kg/m2 34.4 kg/m2 32.12 kg/m2      Hyperlipidemia LDL goal <130 Hyperlipidemia:Low fat diet discussed and encouraged.   Lipid Panel  Lab Results  Component Value Date   CHOL 189 01/01/2018   HDL 43 01/01/2018   LDLCALC 120 (H) 01/01/2018   TRIG 147 01/01/2018   CHOLHDL 4.4 01/01/2018  Updated lab needed at/ before next visit. Not at goal  Follow Up Instructions:    I discussed the assessment and treatment plan with the patient. The patient was provided an opportunity to ask questions and all were answered. The patient agreed with the plan and demonstrated an understanding of the instructions.   The patient was advised to call back or seek an in-person evaluation if the symptoms worsen or if the condition fails to improve as anticipated.  I provided 25 minutes of  non-face-to-face time during this encounter.   Tula Nakayama, MD

## 2019-02-03 ENCOUNTER — Encounter: Payer: Self-pay | Admitting: Family Medicine

## 2019-02-03 NOTE — Assessment & Plan Note (Signed)
Hyperlipidemia:Low fat diet discussed and encouraged.   Lipid Panel  Lab Results  Component Value Date   CHOL 189 01/01/2018   HDL 43 01/01/2018   LDLCALC 120 (H) 01/01/2018   TRIG 147 01/01/2018   CHOLHDL 4.4 01/01/2018  Updated lab needed at/ before next visit. Not at goal

## 2019-02-03 NOTE — Assessment & Plan Note (Signed)
3 4 pack year smoking history, quit in 2014, currently vaping , needs lung cancer screening

## 2019-02-03 NOTE — Assessment & Plan Note (Signed)
Asked:confirms currently vapes, s Assess: Unwilling to quit but cutting back Advise: needs to QUIT to reduce risk of cancer, cardio and cerebrovascular disease Assist: counseled for 5 minutes  Arrange: follow up in 3 months

## 2019-02-03 NOTE — Assessment & Plan Note (Signed)
  Patient re-educated about  the importance of commitment to a  minimum of 150 minutes of exercise per week as able.  The importance of healthy food choices with portion control discussed, as well as eating regularly and within a 12 hour window most days. The need to choose "clean , green" food 50 to 75% of the time is discussed, as well as to make water the primary drink and set a goal of 64 ounces water daily.    Weight /BMI 02/01/2019 10/06/2018 09/21/2018  WEIGHT 219 lb 3.2 oz 216 lb 6.4 oz 202 lb  HEIGHT 5' 6.5" 5' 6.5" 5' 6.5"  BMI 34.85 kg/m2 34.4 kg/m2 32.12 kg/m2

## 2019-02-03 NOTE — Assessment & Plan Note (Signed)
Controlled, no change in medication DASH diet and commitment to daily physical activity for a minimum of 30 minutes discussed and encouraged, as a part of hypertension management. The importance of attaining a healthy weight is also discussed.  BP/Weight 02/01/2019 10/06/2018 09/21/2018 06/02/2018 12/29/2017 11/19/2017 [id number]  Systolic BP [id number] 123456 123456 123456 [id number] 97  Diastolic BP 80 80 70 70 76 68 71  Wt. (Lbs) 219.2 216.4 202 202.12 210.12 203.12 200  BMI 34.85 34.4 32.12 32.13 33.41 32.29 31.8

## 2019-02-04 ENCOUNTER — Encounter: Payer: Self-pay | Admitting: Family Medicine

## 2019-02-04 ENCOUNTER — Other Ambulatory Visit: Payer: Self-pay | Admitting: Family Medicine

## 2019-02-04 DIAGNOSIS — R972 Elevated prostate specific antigen [PSA]: Secondary | ICD-10-CM

## 2019-02-04 LAB — LIPID PANEL
Cholesterol: 183 mg/dL (ref <200)
HDL: 34 mg/dL — ABNORMAL LOW (ref >=40)
LDL Cholesterol (Calc): 123 mg/dL (calc) — ABNORMAL HIGH
Non-HDL Cholesterol (Calc): 149 mg/dL (calc) — ABNORMAL HIGH (ref <130)
Total CHOL/HDL Ratio: 5.4 (calc) — ABNORMAL HIGH (ref <5.0)
Triglycerides: 144 mg/dL (ref <150)

## 2019-02-04 LAB — COMPLETE METABOLIC PANEL WITH GFR
AG Ratio: 1.7 (calc) (ref 1.0–2.5)
ALT: 16 U/L (ref 9–46)
AST: 20 U/L (ref 10–35)
Albumin: 4.9 g/dL (ref 3.6–5.1)
Alkaline phosphatase (APISO): 66 U/L (ref 35–144)
BUN: 19 mg/dL (ref 7–25)
CO2: 25 mmol/L (ref 20–32)
Calcium: 9.8 mg/dL (ref 8.6–10.3)
Chloride: 101 mmol/L (ref 98–110)
Creat: 1.01 mg/dL (ref 0.70–1.25)
GFR, Est African American: 93 mL/min/{1.73_m2} (ref >=60)
GFR, Est Non African American: 80 mL/min/{1.73_m2} (ref >=60)
Globulin: 2.9 g/dL (calc) (ref 1.9–3.7)
Glucose, Bld: 95 mg/dL (ref 65–99)
Potassium: 4.2 mmol/L (ref 3.5–5.3)
Sodium: 140 mmol/L (ref 135–146)
Total Bilirubin: 0.5 mg/dL (ref 0.2–1.2)
Total Protein: 7.8 g/dL (ref 6.1–8.1)

## 2019-02-04 LAB — CBC
HCT: 43.6 % (ref 38.5–50.0)
Hemoglobin: 13.6 g/dL (ref 13.2–17.1)
MCH: 26.8 pg — ABNORMAL LOW (ref 27.0–33.0)
MCHC: 31.2 g/dL — ABNORMAL LOW (ref 32.0–36.0)
MCV: 86 fL (ref 80.0–100.0)
MPV: 11 fL (ref 7.5–12.5)
Platelets: 286 10*3/uL (ref 140–400)
RBC: 5.07 10*6/uL (ref 4.20–5.80)
RDW: 14.1 % (ref 11.0–15.0)
WBC: 5 10*3/uL (ref 3.8–10.8)

## 2019-02-04 LAB — TSH: TSH: 1.02 mIU/L (ref 0.40–4.50)

## 2019-02-04 LAB — PSA: PSA: 6.5 ng/mL — ABNORMAL HIGH (ref <=4.0)

## 2019-02-04 NOTE — Progress Notes (Signed)
amb urology  

## 2019-02-14 ENCOUNTER — Other Ambulatory Visit: Payer: Self-pay | Admitting: Family Medicine

## 2019-02-14 DIAGNOSIS — J4530 Mild persistent asthma, uncomplicated: Secondary | ICD-10-CM

## 2019-03-09 ENCOUNTER — Telehealth: Payer: Self-pay

## 2019-03-09 NOTE — Telephone Encounter (Signed)
Noted, pls also make pt aware of the update

## 2019-03-09 NOTE — Telephone Encounter (Signed)
Called to precert his low dose lung cancer screen and they said he no longer has insurance and is currently uninsured.

## 2019-03-22 ENCOUNTER — Other Ambulatory Visit: Payer: Self-pay

## 2019-03-22 ENCOUNTER — Ambulatory Visit (INDEPENDENT_AMBULATORY_CARE_PROVIDER_SITE_OTHER): Payer: BC Managed Care – PPO | Admitting: Urology

## 2019-03-22 ENCOUNTER — Other Ambulatory Visit (HOSPITAL_COMMUNITY)
Admission: RE | Admit: 2019-03-22 | Discharge: 2019-03-22 | Disposition: A | Discharge status: Home / Self Care | Payer: BC Managed Care – PPO | Source: Ambulatory Visit | Attending: Urology | Admitting: Urology

## 2019-03-22 VITALS — BP 129/80 | HR 99 | Temp 97.5°F | Ht 66.5 in | Wt 220.0 lb

## 2019-03-22 DIAGNOSIS — R972 Elevated prostate specific antigen [PSA]: Secondary | ICD-10-CM

## 2019-03-22 NOTE — Progress Notes (Signed)
H&P  Chief Complaint: Elevated PSA  History of Present Illness:   1.5.2021: He returns today for follow-up having had a recently elevated PSA measurement --  6.5 on 11.19.2020. He denies any changes in urinary sx's in the last year. He expresses that he was a little hesitant to return after his bx.  (below copied from [place] records):   6.25.2020: TRUS/Bx -- all cores negative. PSA 4.2 volume 37 ml, PSAD 0.11.  5.7.2019: Todd Randall is a 64 year-old male patient who was referred by Dr. Tula Nakayama, MD who is here evaluation of his PSA.  He is a patient of Dr Bari Mantis seen in office consultation today. He has not undergone a prior prostate biopsy. Patient does have a family history of prostate cancer. Family members with diagnosed prostate cancer include his father.   PSA has been increasing, most recently to 4.2. Father died of PCa  His symptoms have gotten worse over the last year. He has not been treated with medications in the past for his lower urinary tract symptoms.   IPSS 11, QOL score 3. Not on any meds.   Past Medical History:  Diagnosis Date  . COPD (chronic obstructive pulmonary disease) ([place])   . Hyperlipidemia   . Hypertension     Past Surgical History:  Procedure Laterality Date  . COLONOSCOPY N/A 11/10/2012   Procedure: COLONOSCOPY;  Surgeon: Rogene Houston, MD;  [facility];  Service: Endoscopy;  Laterality: N/A;  205-moved to 120 Ann to notify pt  . COLONOSCOPY N/A 10/29/2017   Procedure: COLONOSCOPY;  Surgeon: Rogene Houston, MD;  [facility];  Service: Endoscopy;  Laterality: N/A;  1200  . POLYPECTOMY  10/29/2017   Procedure: POLYPECTOMY;  Surgeon: Rogene Houston, MD;  [facility];  Service: Endoscopy;;  colon  . 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    Home Medications:  Allergies as of 03/22/2019      Reactions   Lisinopril Cough      Medication List       Accurate as of March 22, 2019  1:48 PM. If you have any questions, ask your nurse or doctor.        allopurinol 300 MG tablet Commonly known as: ZYLOPRIM TAKE 1 TABLET BY MOUTH  DAILY   amLODipine 10 MG tablet Commonly known as: NORVASC Take 1 tablet (10 mg total) by mouth daily.   aspirin 81 MG tablet Take 1 tablet (81 mg total) by mouth daily.   B-12 2500 MCG Tabs Take 2,500 mcg by mouth daily.   cholecalciferol 1000 units tablet Commonly known as: VITAMIN D Take 1,000 Units by mouth daily.   cloNIDine 0.1 MG tablet Commonly known as: CATAPRES Take 1 tablet (0.1 mg total) by mouth at bedtime.   CoQ10 100 MG Caps Take 100 mg by mouth daily.   fish oil-omega-3 fatty acids 1000 MG capsule Take 2 g by mouth daily.   Magnesium 400 MG Tabs Take 400 mg by mouth daily.   multivitamin tablet Take 1 tablet by mouth daily.   pravastatin 10 MG tablet Commonly known as: PRAVACHOL Take 1 tablet (10 mg total) by mouth daily.   spironolactone 25 MG tablet Commonly known as: ALDACTONE TAKE 1 TABLET BY MOUTH  DAILY   Wixela Inhub 250-50 MCG/DOSE Aepb Generic drug: Fluticasone-Salmeterol USE 1 INHALATION BY MOUTH  TWO TIMES DAILY   zinc gluconate 50 MG tablet Take  50 mg by mouth daily.       Allergies:  Allergies  Allergen Reactions  . Lisinopril Cough    Family History  Problem Relation Age of Onset  . Colon cancer Mother     Social History:  reports that he has been smoking e-cigarettes. He has never used smokeless tobacco. He reports that he does not drink alcohol or use drugs.  ROS: A complete review of systems was performed.  All systems are negative except for pertinent findings as noted.  Physical Exam:  Vital signs in last 24 hours: BP 129/80   Pulse 99   Temp (!) 97.5 F (36.4 C)   Ht 5' 6.5" (1.689 m)   Wt 220 lb (99.8 kg)   BMI 34.98 kg/m  Constitutional:  Alert and oriented, No acute distress. Overweight.  Cardiovascular: Regular rate  Respiratory: Normal  respiratory effort GI: Abdomen is soft, nontender, nondistended, no abdominal masses. No CVAT.  Genitourinary: Normal male phallus (uncircumcised), testes are descended bilaterally and non-tender and without masses, scrotum is normal in appearance without lesions or masses, perineum is normal on inspection. Prostate feels about 60 grams in size, no nodules or firmness. Lymphatic: No lymphadenopathy Neurologic: Grossly intact, no focal deficits Psychiatric: Normal mood and affect  Laboratory Data:  No results for input(s): WBC, HGB, HCT, PLT in the last 72 hours.  No results for input(s): NA, K, CL, GLUCOSE, BUN, CALCIUM, CREATININE in the last 72 hours.  Invalid input(s): CO3   No results found for this or any previous visit (from the past 24 hour(s)). No results found for this or any previous visit (from the past 240 hour(s)).  Renal Function: No results for input(s): CREATININE in the last 168 hours. CrCl cannot be calculated (Patient's most recent lab result is older than the maximum 21 days allowed.).  Radiologic Imaging: No results found.  Impression/Assessment:  His PSA is elevated from his typical baseline. He has previously had a negative [place] bx approximately 1.5 years ago and there is not currently a need to repeat this at this point. At this point, we will follow-up with PCA3.  Plan:  1. PCA3 today -- will forward results.  2. Pending results, will plan appropriate follow-up. If positive, we may move forward with MRI.  3. He will return on a 6 mo basis with regular PSA checks.

## 2019-03-23 LAB — MISC LABCORP TEST (SEND OUT)

## 2019-03-30 NOTE — Telephone Encounter (Signed)
Pateint scheduled and aware

## 2019-03-30 NOTE — Telephone Encounter (Signed)
Since its the first of the year can you call patient and verify if  he has insurance because when I called to precert his CT in Dec I was told he did not

## 2019-03-30 NOTE — Telephone Encounter (Signed)
Pt does have bcbs and it is in the system

## 2019-04-07 ENCOUNTER — Other Ambulatory Visit: Payer: Self-pay

## 2019-04-07 ENCOUNTER — Ambulatory Visit (HOSPITAL_COMMUNITY)
Admission: RE | Admit: 2019-04-07 | Discharge: 2019-04-07 | Disposition: A | Discharge status: Home / Self Care | Payer: BC Managed Care – PPO | Source: Ambulatory Visit | Attending: Family Medicine | Admitting: Family Medicine

## 2019-04-07 DIAGNOSIS — Z87891 Personal history of nicotine dependence: Secondary | ICD-10-CM | POA: Diagnosis not present

## 2019-04-11 ENCOUNTER — Encounter: Payer: Self-pay | Admitting: Family Medicine

## 2019-05-18 ENCOUNTER — Other Ambulatory Visit: Payer: Self-pay | Admitting: Family Medicine

## 2019-06-15 ENCOUNTER — Ambulatory Visit (INDEPENDENT_AMBULATORY_CARE_PROVIDER_SITE_OTHER): Payer: BC Managed Care – PPO | Admitting: Family Medicine

## 2019-06-15 ENCOUNTER — Other Ambulatory Visit: Payer: Self-pay

## 2019-06-15 ENCOUNTER — Encounter: Payer: Self-pay | Admitting: Family Medicine

## 2019-06-15 VITALS — BP 120/84 | HR 88 | Ht 66.5 in | Wt 215.0 lb

## 2019-06-15 DIAGNOSIS — I10 Essential (primary) hypertension: Secondary | ICD-10-CM

## 2019-06-15 DIAGNOSIS — E785 Hyperlipidemia, unspecified: Secondary | ICD-10-CM

## 2019-06-15 DIAGNOSIS — R972 Elevated prostate specific antigen [PSA]: Secondary | ICD-10-CM | POA: Insufficient documentation

## 2019-06-15 DIAGNOSIS — J438 Other emphysema: Secondary | ICD-10-CM | POA: Diagnosis not present

## 2019-06-15 DIAGNOSIS — Z87891 Personal history of nicotine dependence: Secondary | ICD-10-CM

## 2019-06-15 DIAGNOSIS — J4530 Mild persistent asthma, uncomplicated: Secondary | ICD-10-CM

## 2019-06-15 DIAGNOSIS — E66811 Obesity, class 1: Secondary | ICD-10-CM

## 2019-06-15 DIAGNOSIS — R7301 Impaired fasting glucose: Secondary | ICD-10-CM

## 2019-06-15 DIAGNOSIS — Z0001 Encounter for general adult medical examination with abnormal findings: Secondary | ICD-10-CM | POA: Diagnosis not present

## 2019-06-15 DIAGNOSIS — E669 Obesity, unspecified: Secondary | ICD-10-CM | POA: Diagnosis not present

## 2019-06-15 DIAGNOSIS — Z720 Tobacco use: Secondary | ICD-10-CM

## 2019-06-15 MED ORDER — PRAVASTATIN SODIUM 10 MG PO TABS: 10.0000 mg | ORAL_TABLET | Freq: Every day | ORAL | 3 refills | Status: DC

## 2019-06-15 MED ORDER — ALLOPURINOL 300 MG PO TABS: 300.0000 mg | ORAL_TABLET | Freq: Every day | ORAL | 3 refills | Status: DC

## 2019-06-15 MED ORDER — CLONIDINE HCL 0.1 MG PO TABS: 0.1000 mg | ORAL_TABLET | Freq: Every day | ORAL | 3 refills | Status: DC

## 2019-06-15 MED ORDER — AMLODIPINE BESYLATE 10 MG PO TABS: 10.0000 mg | ORAL_TABLET | Freq: Every day | ORAL | 3 refills | Status: DC

## 2019-06-15 MED ORDER — FLUTICASONE-SALMETEROL 250-50 MCG/DOSE IN AEPB: INHALATION_SPRAY | RESPIRATORY_TRACT | 3 refills | Status: DC

## 2019-06-15 MED ORDER — SPIRONOLACTONE 25 MG PO TABS: 25.0000 mg | ORAL_TABLET | Freq: Every day | ORAL | 3 refills | Status: DC

## 2019-06-15 NOTE — Assessment & Plan Note (Signed)
Needs updated labs, ordered today. Encouraged low fat diet

## 2019-06-15 NOTE — Assessment & Plan Note (Signed)
Todd Randall is educated about the importance of exercise daily to help with weight management. A minumum of 30 minutes daily is recommended. Additionally, importance of healthy food choices  with portion control discussed.  Wt Readings from Last 3 Encounters:  06/15/19 215 lb (97.5 kg)  03/22/19 220 lb (99.8 kg)  02/01/19 219 lb 3.2 oz (99.4 kg)

## 2019-06-15 NOTE — Progress Notes (Signed)
Todd Randall is a 64 y.o. male who presents for annual wellness visit and follow-up on chronic medical conditions.  He has the following concerns: none   Today patient denies signs and symptoms of COVID 19 infection including fever, chills, cough, shortness of breath, and headache.  Past Medical, Surgical, Social History, Allergies, and Medications have been Reviewed.   Immunization History  Administered Date(s) Administered  . Influenza Whole 12/25/2006  . Influenza,inj,Quad PF,6+ Mos 12/22/2012, 01/30/2014, 11/15/2014, 01/17/2016, 11/11/2016, 12/29/2017, 12/13/2018  . Pneumococcal Conjugate-13 09/13/2013  . Pneumococcal Polysaccharide-23 10/16/2005  . Td 03/18/1995  . Tdap 08/02/2012  . Zoster Recombinat (Shingrix) 06/02/2018, 08/24/2018   Last colonoscopy: 2020  Last PSA: 01/2019 Dentist: has not seen in years, dentures are not well fitted Ophtho: has not seen in years, glasses are older Exercise: very little   End of Life Discussion:  Patient does not have a living will and medical power of attorney  The patient denies anorexia, fever, weight changes, headaches,  vision loss, decreased hearing, ear pain, hoarseness, chest pain, palpitations, dizziness, syncope, dyspnea on exertion, cough, swelling, nausea, vomiting, diarrhea, constipation, abdominal pain, melena, hematochezia, indigestion/heartburn, hematuria, incontinence, erectile dysfunction, nocturia, weakened urine stream, dysuria, genital lesions, joint pains, numbness, tingling, weakness, tremor, suspicious skin lesions, depression, anxiety, abnormal bleeding/bruising, or enlarged lymph nodes  PHYSICAL EXAM:  BP 120/84   Pulse 88   Ht 5' 6.5" (1.689 m)   Wt 215 lb (97.5 kg)   SpO2 98%   BMI 34.18 kg/m   Physical Exam Vitals and nursing note reviewed.  Constitutional:      Appearance: Normal appearance. He is well-developed and well-groomed. He is obese.  HENT:     Head: Normocephalic and atraumatic.     Right  Ear: Hearing, tympanic membrane, ear canal and external ear normal.     Left Ear: Hearing, tympanic membrane, ear canal and external ear normal.     Nose: Nose normal.     Mouth/Throat:     Mouth: Mucous membranes are moist.     Dentition: Has dentures.     Pharynx: Oropharynx is clear.  Eyes:     General: Lids are normal.        Right eye: No discharge.        Left eye: No discharge.     Extraocular Movements: Extraocular movements intact.     Conjunctiva/sclera: Conjunctivae normal.     Pupils: Pupils are equal, round, and reactive to light.  Neck:     Thyroid: No thyroid mass, thyromegaly or thyroid tenderness.  Cardiovascular:     Rate and Rhythm: Normal rate and regular rhythm.     Pulses: Normal pulses.          Radial pulses are 2+ on the right side and 2+ on the left side.     Heart sounds: Normal heart sounds.  Pulmonary:     Effort: Pulmonary effort is normal.     Breath sounds: Normal breath sounds. Decreased air movement present.  Abdominal:     General: Bowel sounds are normal. There is no distension.     Palpations: Abdomen is soft.     Tenderness: There is no right CVA tenderness or left CVA tenderness.  Musculoskeletal:        General: Normal range of motion.     Cervical back: Normal range of motion and neck supple.     Right lower leg: No edema.     Left lower leg: No edema.  Lymphadenopathy:  Cervical: No cervical adenopathy.  Skin:    General: Skin is warm and dry.     Capillary Refill: Capillary refill takes less than 2 seconds.  Neurological:     General: No focal deficit present.     Mental Status: He is alert and oriented to person, place, and time.     Cranial Nerves: Cranial nerves are intact.     Sensory: Sensation is intact.     Motor: Motor function is intact.     Coordination: Coordination is intact.     Gait: Gait is intact.     Deep Tendon Reflexes: Reflexes are normal and symmetric.  Psychiatric:        Attention and Perception:  Attention and perception normal.        Mood and Affect: Mood and affect normal.        Speech: Speech normal.        Behavior: Behavior normal. Behavior is cooperative.        Thought Content: Thought content normal.        Cognition and Memory: Cognition and memory normal.        Judgment: Judgment normal.      Depression screen [facility] 2/9 06/15/2019 02/01/2019 10/06/2018  Decreased Interest 0 0 0  Down, Depressed, Hopeless 0 0 0  PHQ - 2 Score 0 0 0  Altered sleeping - - -  Tired, decreased energy - - -  Change in appetite - - -  Feeling bad or failure about yourself  - - -  Trouble concentrating - - -  Moving slowly or fidgety/restless - - -  Suicidal thoughts - - -  PHQ-9 Score - - -  Difficult doing work/chores - - -     ASSESSMENT/PLAN:  1. Annual visit for general adult medical examination with abnormal findings  - CBC - COMPLETE METABOLIC PANEL WITH GFR - Hemoglobin A1c - Lipid panel - TSH  2. Other emphysema ([place])   3. Essential hypertension  - CBC - COMPLETE METABOLIC PANEL WITH GFR  4. Obesity (BMI 30.0-34.9)  - CBC - COMPLETE METABOLIC PANEL WITH GFR - Hemoglobin A1c - Lipid panel  5. Nicotine vapor product user   6. Hyperlipidemia LDL goal <130  - Lipid panel  7. H/O nicotine dependence   8. Elevated PSA  - PSA  9. Impaired fasting glucose  - Hemoglobin A1c   The patient's weight, height, BMI, and visual acuity have been recorded in the chart.  I have made referrals, counseling, and provided education to the patient based on review of the above and I have provided the patient with a written personalized care plan for preventive services.     Perlie Mayo, NP   06/15/2019

## 2019-06-15 NOTE — Assessment & Plan Note (Signed)
Getting updated labs, is followed by urology he reports.

## 2019-06-15 NOTE — Patient Instructions (Signed)
I appreciate the opportunity to provide you with care for your health and wellness. Today we discussed: overall health   Follow up: 6 months   Labs this week fasting No referrals   Please continue to practice social distancing to keep you, your family, and our community safe.  If you must go out, please wear a mask and practice good handwashing.  It was a pleasure to see you and I look forward to continuing to work together on your health and well-being. Please do not hesitate to call the office if you need care or have questions about your care.  Have a wonderful day and week. With Gratitude, Cherly Beach, DNP, AGNP-BC  HEALTH MAINTENANCE RECOMMENDATIONS:  It is recommended that you get at least 30 minutes of aerobic exercise at least 5 days/week (for weight loss, you may need as much as 60-90 minutes). This can be any activity that gets your heart rate up. This can be divided in 10-15 minute intervals if needed, but try and build up your endurance at least once a week.  Weight bearing exercise is also recommended twice weekly.  Eat a healthy diet with lots of vegetables, fruits and fiber.  "Colorful" foods have a lot of vitamins (ie green vegetables, tomatoes, red peppers, etc).  Limit sweet tea, regular sodas and alcoholic beverages, all of which has a lot of calories and sugar.  Up to 2 alcoholic drinks daily may be beneficial for men (unless trying to lose weight, watch sugars).  Drink a lot of water.  Sunscreen of at least SPF 30 should be used on all sun-exposed parts of the skin when outside between the hours of 10 am and 4 pm (not just when at beach or pool, but even with exercise, golf, tennis, and yard work!)  Use a sunscreen that says "broad spectrum" so it covers both UVA and UVB rays, and make sure to reapply every 1-2 hours.  Remember to change the batteries in your smoke detectors when changing your clock times in the spring and fall.  Use your seat belt every time you are  in a car, and please drive safely and not be distracted with cell phones and texting while driving.

## 2019-06-15 NOTE — Assessment & Plan Note (Signed)
Todd Randall is encouraged to maintain a well balanced diet that is low in salt. Controlled, continue current medication regimen.  Additionally, he is also reminded that exercise is beneficial for heart health and control of  Blood pressure. 30-60 minutes daily is recommended-walking was suggested.

## 2019-06-15 NOTE — Assessment & Plan Note (Signed)
Uses inhaler, doing well.

## 2019-06-15 NOTE — Addendum Note (Signed)
Addended by: Eual Fines on: 06/15/2019 09:03 AM   Modules accepted: Orders

## 2019-06-15 NOTE — Assessment & Plan Note (Signed)
Asked about quitting: confirms they are currently smokes cigarettes Advise to quit smoking: Educated about QUITTING to reduce the risk of cancer, cardio and cerebrovascular disease. Assess willingness: Unwilling to quit at this time, but is working on cutting back. Assist with counseling and pharmacotherapy: Counseled for 5 minutes and literature provided. Arrange for follow up: not ready quitting follow up in 3 months and continue to offer help.

## 2019-06-15 NOTE — Assessment & Plan Note (Signed)
Discussed PSA screening (risks/benefits), recommended at least 30 minutes of aerobic activity at least 5 days/week; proper sunscreen use reviewed; healthy diet and alcohol recommendations (less than or equal to 2 drinks/day) reviewed; regular seatbelt use; changing batteries in smoke detectors. Immunization recommendations discussed.  Colonoscopy recommendations reviewed. ° °

## 2019-06-18 LAB — CBC
HCT: 43.4 % (ref 38.5–50.0)
Hemoglobin: 13.8 g/dL (ref 13.2–17.1)
MCH: 27.2 pg (ref 27.0–33.0)
MCHC: 31.8 g/dL — ABNORMAL LOW (ref 32.0–36.0)
MCV: 85.4 fL (ref 80.0–100.0)
MPV: 10.5 fL (ref 7.5–12.5)
Platelets: 259 10*3/uL (ref 140–400)
RBC: 5.08 10*6/uL (ref 4.20–5.80)
RDW: 15.2 % — ABNORMAL HIGH (ref 11.0–15.0)
WBC: 4.9 10*3/uL (ref 3.8–10.8)

## 2019-06-18 LAB — LIPID PANEL
Cholesterol: 190 mg/dL (ref <200)
HDL: 35 mg/dL — ABNORMAL LOW (ref >=40)
LDL Cholesterol (Calc): 129 mg/dL (calc) — ABNORMAL HIGH
Non-HDL Cholesterol (Calc): 155 mg/dL (calc) — ABNORMAL HIGH (ref <130)
Total CHOL/HDL Ratio: 5.4 (calc) — ABNORMAL HIGH (ref <5.0)
Triglycerides: 151 mg/dL — ABNORMAL HIGH (ref <150)

## 2019-06-18 LAB — HEMOGLOBIN A1C
Hgb A1c MFr Bld: 5 % of total Hgb (ref <5.7)
Mean Plasma Glucose: 97 (calc)
eAG (mmol/L): 5.4 (calc)

## 2019-06-18 LAB — COMPLETE METABOLIC PANEL WITH GFR
AG Ratio: 1.7 (calc) (ref 1.0–2.5)
ALT: 17 U/L (ref 9–46)
AST: 21 U/L (ref 10–35)
Albumin: 4.7 g/dL (ref 3.6–5.1)
Alkaline phosphatase (APISO): 62 U/L (ref 35–144)
BUN: 17 mg/dL (ref 7–25)
CO2: 30 mmol/L (ref 20–32)
Calcium: 10.3 mg/dL (ref 8.6–10.3)
Chloride: 102 mmol/L (ref 98–110)
Creat: 0.98 mg/dL (ref 0.70–1.25)
GFR, Est African American: 96 mL/min/{1.73_m2} (ref >=60)
GFR, Est Non African American: 83 mL/min/{1.73_m2} (ref >=60)
Globulin: 2.8 g/dL (calc) (ref 1.9–3.7)
Glucose, Bld: 98 mg/dL (ref 65–99)
Potassium: 4.5 mmol/L (ref 3.5–5.3)
Sodium: 140 mmol/L (ref 135–146)
Total Bilirubin: 0.5 mg/dL (ref 0.2–1.2)
Total Protein: 7.5 g/dL (ref 6.1–8.1)

## 2019-06-18 LAB — PSA: PSA: 4.3 ng/mL — ABNORMAL HIGH (ref <=4.0)

## 2019-06-18 LAB — TSH: TSH: 1.46 mIU/L (ref 0.40–4.50)

## 2019-09-20 ENCOUNTER — Ambulatory Visit: Payer: BC Managed Care – PPO | Admitting: Urology

## 2019-09-27 ENCOUNTER — Ambulatory Visit: Payer: BC Managed Care – PPO | Admitting: Urology

## 2019-10-18 ENCOUNTER — Ambulatory Visit: Payer: BC Managed Care – PPO | Admitting: Urology

## 2019-11-29 ENCOUNTER — Ambulatory Visit: Payer: BC Managed Care – PPO | Admitting: Urology

## 2019-12-15 ENCOUNTER — Ambulatory Visit: Payer: BC Managed Care – PPO | Admitting: Family Medicine

## 2020-01-10 ENCOUNTER — Encounter: Payer: Self-pay | Admitting: Urology

## 2020-01-10 ENCOUNTER — Ambulatory Visit (INDEPENDENT_AMBULATORY_CARE_PROVIDER_SITE_OTHER): Payer: BC Managed Care – PPO | Admitting: Urology

## 2020-01-10 VITALS — BP 123/79 | HR 94 | Temp 98.8°F | Ht 66.5 in | Wt 215.0 lb

## 2020-01-10 DIAGNOSIS — R972 Elevated prostate specific antigen [PSA]: Secondary | ICD-10-CM

## 2020-01-10 NOTE — Progress Notes (Signed)
Urological Symptom Review  Patient is experiencing the following symptoms: Weak stream   Review of Systems  Gastrointestinal (upper)  : Negative for upper GI symptoms  Gastrointestinal (lower) : Negative for lower GI symptoms  Constitutional : Negative for symptoms  Skin: Negative for skin symptoms  Eyes: Negative for eye symptoms  Ear/Nose/Throat : Negative for Ear/Nose/Throat symptoms  Hematologic/Lymphatic: Negative for Hematologic/Lymphatic symptoms  Cardiovascular : Negative for cardiovascular symptoms  Respiratory : Negative for respiratory symptoms  Endocrine: Negative for endocrine symptoms  Musculoskeletal: Negative for musculoskeletal symptoms  Neurological: Negative for neurological symptoms  Psychologic: Negative for psychiatric symptoms  

## 2020-01-10 NOTE — Progress Notes (Signed)
History of Present Illness: Man returns for follow-up of elevated PSA.  History as follows:  6.25.2020: TRUS/Bx -- all cores negative. PSA 4.2 volume 37 ml, PSAD 0.11.  11.19.2020:PSA 6.5  4.2.2021: PSA 4.3.   10.26.2021: He has minimal lower urinary tract symptomatology.  Biggest issue is a weak stream.  He does not have issues bothersome enough for him to want to take a medication.  No recent gross hematuria or dysuria.  No recent PSA. He did have PCA 3 test drawn in early 2021.  It revealed a score of 32 (weakly positive)     Past Medical History:  Diagnosis Date  . COPD (chronic obstructive pulmonary disease) ([place])   . Hand pain, right 10/06/2018  . Hyperlipidemia   . Hypertension     Past Surgical History:  Procedure Laterality Date  . COLONOSCOPY N/A 11/10/2012   Procedure: COLONOSCOPY;  Surgeon: Rogene Houston, MD;  [facility];  Service: Endoscopy;  Laterality: N/A;  205-moved to 120 Ann to notify pt  . COLONOSCOPY N/A 10/29/2017   Procedure: COLONOSCOPY;  Surgeon: Rogene Houston, MD;  [facility];  Service: Endoscopy;  Laterality: N/A;  1200  . POLYPECTOMY  10/29/2017   Procedure: POLYPECTOMY;  Surgeon: Rogene Houston, MD;  [facility];  Service: Endoscopy;;  colon  . 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    Home Medications:  Allergies as of 01/10/2020      Reactions   Lisinopril Cough      Medication List       Accurate as of January 10, 2020  8:35 AM. If you have any questions, ask your nurse or doctor.        allopurinol 300 MG tablet Commonly known as: ZYLOPRIM Take 1 tablet (300 mg total) by mouth daily.   amLODipine 10 MG tablet Commonly known as: NORVASC Take 1 tablet (10 mg total) by mouth daily.   aspirin 81 MG tablet Take 1 tablet (81 mg total) by mouth daily.   B-12 2500 MCG Tabs Take 2,500 mcg by mouth daily.   cholecalciferol 1000 units tablet Commonly known as:  VITAMIN D Take 1,000 Units by mouth daily.   cloNIDine 0.1 MG tablet Commonly known as: CATAPRES Take 1 tablet (0.1 mg total) by mouth at bedtime.   CoQ10 100 MG Caps Take 100 mg by mouth daily.   fish oil-omega-3 fatty acids 1000 MG capsule Take 2 g by mouth daily.   Fluticasone-Salmeterol 250-50 MCG/DOSE Aepb Commonly known as: Wixela Inhub USE 1 INHALATION BY MOUTH  TWO TIMES DAILY   Magnesium 400 MG Tabs Take 400 mg by mouth daily.   multivitamin tablet Take 1 tablet by mouth daily.   pravastatin 10 MG tablet Commonly known as: PRAVACHOL Take 1 tablet (10 mg total) by mouth daily.   spironolactone 25 MG tablet Commonly known as: ALDACTONE Take 1 tablet (25 mg total) by mouth daily.   zinc gluconate 50 MG tablet Take 50 mg by mouth daily.       Allergies:  Allergies  Allergen Reactions  . Lisinopril Cough    Family History  Problem Relation Age of Onset  . Colon cancer Mother     Social History:  reports that he has been smoking e-cigarettes. He has never used smokeless tobacco. He reports that he does not drink alcohol and does not use drugs.  ROS: A complete review of systems was performed.  All systems are negative except for pertinent findings as noted.  Physical Exam:  Vital signs in last 24 hours: There were no vitals taken for this visit. Constitutional:  Alert and oriented, No acute distress Cardiovascular: Regular rate  Respiratory: Normal respiratory effort Neurologic: Grossly intact, no focal deficits Psychiatric: Normal mood and affect  I have reviewed prior pt notes  I have reviewed notes from referring/previous physicians  I have reviewed urinalysis results  I have independently reviewed prior imaging  I have reviewed prior PSA/PCA 3 results    Impression/Assessment:  1.  Elevated PSA with negative biopsy.  PSA checked 6 months ago was stable.  No significant lower urinary tract symptoms.  PCA 3 test was 32 earlier this year.   We have not performed repeat biopsy  2.  BPH with mild symptomatology, not that bothersome  Plan:  I will check a PSA today, as well as 1 in 6 months  If continued upward trend, consider MRI  I will see back in a year

## 2020-01-11 LAB — PSA: Prostate Specific Ag, Serum: 8.5 ng/mL — ABNORMAL HIGH (ref 0.0–4.0)

## 2020-01-12 ENCOUNTER — Other Ambulatory Visit: Payer: Self-pay | Admitting: Urology

## 2020-01-12 ENCOUNTER — Telehealth: Payer: Self-pay

## 2020-01-12 DIAGNOSIS — R972 Elevated prostate specific antigen [PSA]: Secondary | ICD-10-CM

## 2020-01-12 NOTE — Telephone Encounter (Signed)
Lft msg. Asking pt to call back.

## 2020-01-12 NOTE — Telephone Encounter (Signed)
-----   Message from Franchot Gallo, MD sent at 01/12/2020 10:51 AM EDT ----- Please call pt--psa now 8.5--suggest MRI prostate to see if anything suspicious present in prostate--I put order in. Let me know if he needs sedation ----- Message ----- From: Dorisann Frames, RN Sent: 01/11/2020  10:24 AM EDT To: Franchot Gallo, MD  Please review

## 2020-01-17 NOTE — Telephone Encounter (Signed)
Tried to call pt again. Lft msg on cell # and tried home # but no vm.

## 2020-01-17 NOTE — Telephone Encounter (Signed)
-----   Message from Franchot Gallo, MD sent at 01/12/2020 10:51 AM EDT ----- Please call pt--psa now 8.5--suggest MRI prostate to see if anything suspicious present in prostate--I put order in. Let me know if he needs sedation ----- Message ----- From: Dorisann Frames, RN Sent: 01/11/2020  10:24 AM EDT To: Franchot Gallo, MD  Please review

## 2020-01-18 ENCOUNTER — Telehealth: Payer: Self-pay

## 2020-01-18 NOTE — Telephone Encounter (Signed)
-----   Message from Franchot Gallo, MD sent at 01/12/2020 10:51 AM EDT ----- Please call pt--psa now 8.5--suggest MRI prostate to see if anything suspicious present in prostate--I put order in. Let me know if he needs sedation ----- Message ----- From: Dorisann Frames, RN Sent: 01/11/2020  10:24 AM EDT To: Franchot Gallo, MD  Please review

## 2020-01-23 ENCOUNTER — Ambulatory Visit (INDEPENDENT_AMBULATORY_CARE_PROVIDER_SITE_OTHER): Payer: BC Managed Care – PPO | Admitting: Internal Medicine

## 2020-01-23 ENCOUNTER — Other Ambulatory Visit: Payer: Self-pay

## 2020-01-23 ENCOUNTER — Encounter: Payer: Self-pay | Admitting: Internal Medicine

## 2020-01-23 ENCOUNTER — Ambulatory Visit: Payer: BC Managed Care – PPO | Admitting: Family Medicine

## 2020-01-23 VITALS — BP 125/80 | HR 83 | Temp 98.8°F | Resp 18 | Ht 66.0 in | Wt 221.0 lb

## 2020-01-23 DIAGNOSIS — R972 Elevated prostate specific antigen [PSA]: Secondary | ICD-10-CM | POA: Diagnosis not present

## 2020-01-23 DIAGNOSIS — Z23 Encounter for immunization: Secondary | ICD-10-CM | POA: Diagnosis not present

## 2020-01-23 DIAGNOSIS — J438 Other emphysema: Secondary | ICD-10-CM | POA: Diagnosis not present

## 2020-01-23 DIAGNOSIS — I10 Essential (primary) hypertension: Secondary | ICD-10-CM | POA: Diagnosis not present

## 2020-01-23 DIAGNOSIS — E785 Hyperlipidemia, unspecified: Secondary | ICD-10-CM | POA: Diagnosis not present

## 2020-01-23 NOTE — Assessment & Plan Note (Signed)
Stable On Wixela

## 2020-01-23 NOTE — Patient Instructions (Signed)
Please continue to take medications as prescribed.  You are advised to quit vaping to decrease the risk of vaping associated lung injury.  Please follow DASH diet and perform moderate exercise at least 150 mins/week.  DASH stands for Dietary Approaches to Stop Hypertension. The DASH diet is a healthy-eating plan designed to help treat or prevent high blood pressure (hypertension).  The DASH diet includes foods that are rich in potassium, calcium and magnesium. These nutrients help control blood pressure. The diet limits foods that are high in sodium, saturated fat and added sugars.  Studies have shown that the DASH diet can lower blood pressure in as little as two weeks. The diet can also lower low-density lipoprotein (LDL or "bad") cholesterol levels in the blood. High blood pressure and high LDL cholesterol levels are two major risk factors for heart disease and stroke.    DASH diet: Recommended servings The DASH diet provides daily and weekly nutritional goals. The number of servings you should have depends on your daily calorie needs.  Here's a look at the recommended servings from each food group for a 2,000-calorie-a-day DASH diet:  Grains: 6 to 8 servings a day. One serving is one slice bread, 1 ounce dry cereal, or 1/2 cup cooked cereal, rice or pasta. Vegetables: 4 to 5 servings a day. One serving is 1 cup raw leafy green vegetable, 1/2 cup cut-up raw or cooked vegetables, or 1/2 cup vegetable juice. Fruits: 4 to 5 servings a day. One serving is one medium fruit, 1/2 cup fresh, frozen or canned fruit, or 1/2 cup fruit juice. Fat-free or low-fat dairy products: 2 to 3 servings a day. One serving is 1 cup milk or yogurt, or 1 1/2 ounces cheese. Lean meats, poultry and fish: six 1-ounce servings or fewer a day. One serving is 1 ounce cooked meat, poultry or fish, or 1 egg. Nuts, seeds and legumes: 4 to 5 servings a week. One serving is 1/3 cup nuts, 2 tablespoons peanut butter, 2  tablespoons seeds, or 1/2 cup cooked legumes (dried beans or peas). Fats and oils: 2 to 3 servings a day. One serving is 1 teaspoon soft margarine, 1 teaspoon vegetable oil, 1 tablespoon mayonnaise or 2 tablespoons salad dressing. Sweets and added sugars: 5 servings or fewer a week. One serving is 1 tablespoon sugar, jelly or jam, 1/2 cup sorbet, or 1 cup lemonade.

## 2020-01-23 NOTE — Assessment & Plan Note (Signed)
BP Readings from Last 1 Encounters:  01/23/20 125/80   Well-controlled with amlodipine, spironolactone, and clonidine Counseled for compliance with the medications Advised DASH diet and moderate exercise/walking, at least 150 mins/week

## 2020-01-23 NOTE — Assessment & Plan Note (Signed)
Follows up with Urologist Planned to get MRI pelvis

## 2020-01-23 NOTE — Progress Notes (Signed)
Established Patient Office Visit  Subjective:  Patient ID: Todd Randall, male    DOB: 01-27-1956  Age: 64 y.o. MRN: [id number]  CC:  Chief Complaint  Patient presents with  . Follow-up    6 month follow up pt is not sleeping well thinks this is due to work     HPI Todd Randall is 64 year old male with past medical history of hypertension, COPD, and elevated PSA presents for follow-up of his chronic medical conditions.  His blood pressure was 157/82, upon repeat check-it was 125/80.  He denies any headache, dizziness, chest pain, dyspnea or palpitations.  He takes amlodipine, clonidine and spironolactone regularly and tolerates them well.  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  He denies any change in weight or appetite or anhedonia.  Past Medical History:  Diagnosis Date  . COPD (chronic obstructive pulmonary disease) ([place])   . Hand pain, right 10/06/2018  . Hyperlipidemia   . Hypertension     Past Surgical History:  Procedure Laterality Date  . COLONOSCOPY N/A 11/10/2012   Procedure: COLONOSCOPY;  Surgeon: Rogene Houston, MD;  [facility];  Service: Endoscopy;  Laterality: N/A;  205-moved to 120 Ann to notify pt  . COLONOSCOPY N/A 10/29/2017   Procedure: COLONOSCOPY;  Surgeon: Rogene Houston, MD;  [facility];  Service: Endoscopy;  Laterality: N/A;  1200  . POLYPECTOMY  10/29/2017   Procedure: POLYPECTOMY;  Surgeon: Rogene Houston, MD;  [facility];  Service: Endoscopy;;  colon  . 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    Family History  Problem Relation Age of Onset  . Colon cancer Mother     Social History   Socioeconomic History  . Marital status: Married    Spouse name: Not on file  . Number of children: Not on file  . Years of education: Not on file  . Highest education level: Not on file  Occupational History  . Not on file  Tobacco Use  .  Smoking status: Current Every Day Smoker    Types: E-cigarettes  . Smokeless tobacco: Never Used  . Tobacco comment: quit 6 yrs ago. He is using the E-cigarettes  Vaping Use  . Vaping Use: Every day  Substance and Sexual Activity  . Alcohol use: No    Alcohol/week: 0.0 standard drinks  . Drug use: No  . Sexual activity: Yes  Other Topics Concern  . Not on file  Social History Narrative  . Not on file   Social Determinants of Health   Financial Resource Strain:   . Difficulty of Paying Living Expenses: Not on file  Food Insecurity:   . Worried About Charity fundraiser in the Last Year: Not on file  . Ran Out of Food in the Last Year: Not on file  Transportation Needs:   . Lack of Transportation (Medical): Not on file  . Lack of Transportation (Non-Medical): Not on file  Physical Activity:   . Days of Exercise per Week: Not on file  . Minutes of Exercise per Session: Not on file  Stress:   . Feeling of Stress : Not on file  Social Connections:   . Frequency of Communication with Friends and Family: Not on file  . Frequency of Social Gatherings with Friends and Family: Not on file  . Attends Religious Services: Not  on file  . Active Member of Clubs or Organizations: Not on file  . Attends Archivist Meetings: Not on file  . Marital Status: Not on file  Intimate Partner Violence:   . Fear of Current or Ex-Partner: Not on file  . Emotionally Abused: Not on file  . Physically Abused: Not on file  . Sexually Abused: Not on file    Outpatient Medications Prior to Visit  Medication Sig Dispense Refill  . allopurinol (ZYLOPRIM) 300 MG tablet Take 1 tablet (300 mg total) by mouth daily. 90 tablet 3  . amLODipine (NORVASC) 10 MG tablet Take 1 tablet (10 mg total) by mouth daily. 90 tablet 3  . aspirin 81 MG tablet Take 1 tablet (81 mg total) by mouth daily. 30 tablet   . cholecalciferol (VITAMIN D) 1000 units tablet Take 1,000 Units by mouth daily.    . cloNIDine  (CATAPRES) 0.1 MG tablet Take 1 tablet (0.1 mg total) by mouth at bedtime. 90 tablet 3  . Coenzyme Q10 (COQ10) 100 MG CAPS Take 100 mg by mouth daily.    . Cyanocobalamin (B-12) 2500 MCG TABS Take 2,500 mcg by mouth daily.    . fish oil-omega-3 fatty acids 1000 MG capsule Take 2 g by mouth daily.     . Fluticasone-Salmeterol (WIXELA INHUB) 250-50 MCG/DOSE AEPB USE 1 INHALATION BY MOUTH  TWO TIMES DAILY 180 each 3  . Magnesium 400 MG TABS Take 400 mg by mouth daily.    . Multiple Vitamin (MULTIVITAMIN) tablet Take 1 tablet by mouth daily.    . pravastatin (PRAVACHOL) 10 MG tablet Take 1 tablet (10 mg total) by mouth daily. 90 tablet 3  . spironolactone (ALDACTONE) 25 MG tablet Take 1 tablet (25 mg total) by mouth daily. 90 tablet 3  . zinc gluconate 50 MG tablet Take 50 mg by mouth daily.     No facility-administered medications prior to visit.    Allergies  Allergen Reactions  . Lisinopril Cough    ROS Review of Systems  Constitutional: Negative for chills and fever.  HENT: Negative for congestion and sore throat.   Eyes: Negative for pain and discharge.  Respiratory: Negative for cough and shortness of breath.   Cardiovascular: Negative for chest pain and palpitations.  Gastrointestinal: Negative for constipation, diarrhea, nausea and vomiting.  Endocrine: Negative for polydipsia and polyuria.  Genitourinary: Negative for dysuria and hematuria.  Musculoskeletal: Negative for neck pain and neck stiffness.  Skin: Negative for rash.  Neurological: Negative for dizziness, weakness, numbness and headaches.  Psychiatric/Behavioral: Negative for agitation and behavioral problems.      Objective:    Physical Exam Vitals reviewed.  Constitutional:      General: He is not in acute distress.    Appearance: He is obese. He is not diaphoretic.  HENT:     Head: Normocephalic and atraumatic.     Nose: Nose normal. No congestion.     Mouth/Throat:     Mouth: Mucous membranes are  moist.     Pharynx: No posterior oropharyngeal erythema.  Eyes:     General: No scleral icterus.    Extraocular Movements: Extraocular movements intact.     Pupils: Pupils are equal, round, and reactive to light.  Cardiovascular:     Rate and Rhythm: Normal rate and regular rhythm.     Pulses: Normal pulses.     Heart sounds: Normal heart sounds. No murmur heard.   Pulmonary:     Breath sounds: Normal breath sounds.  No wheezing or rales.  Abdominal:     Palpations: Abdomen is soft.     Tenderness: There is no abdominal tenderness.  Musculoskeletal:     Cervical back: Neck supple. No tenderness.     Right lower leg: No edema.     Left lower leg: No edema.  Skin:    General: Skin is warm.     Findings: No rash.  Neurological:     General: No focal deficit present.     Mental Status: He is alert and oriented to person, place, and time.     Sensory: No sensory deficit.     Motor: No weakness.  Psychiatric:        Mood and Affect: Mood normal.        Behavior: Behavior normal.     BP 125/80 (BP Location: Right Arm, Patient Position: Sitting)   Pulse 83   Temp 98.8 F (37.1 C) (Oral)   Resp 18   Ht 5\' 6"  (1.676 m)   Wt 221 lb (100.2 kg)   SpO2 97%   BMI 35.67 kg/m  Wt Readings from Last 3 Encounters:  01/23/20 221 lb (100.2 kg)  01/10/20 215 lb (97.5 kg)  06/15/19 215 lb (97.5 kg)     There are no preventive care reminders to display for this patient.  There are no preventive care reminders to display for this patient.  Lab Results  Component Value Date   TSH 1.46 06/17/2019   Lab Results  Component Value Date   WBC 4.9 06/17/2019   HGB 13.8 06/17/2019   HCT 43.4 06/17/2019   MCV 85.4 06/17/2019   PLT 259 06/17/2019   Lab Results  Component Value Date   NA 140 06/17/2019   K 4.5 06/17/2019   CO2 30 06/17/2019   GLUCOSE 98 06/17/2019   BUN 17 06/17/2019   CREATININE 0.98 06/17/2019   BILITOT 0.5 06/17/2019   ALKPHOS 61 11/07/2016   AST 21  06/17/2019   ALT 17 06/17/2019   PROT 7.5 06/17/2019   ALBUMIN 4.7 11/07/2016   CALCIUM 10.3 06/17/2019   Lab Results  Component Value Date   CHOL 190 06/17/2019   Lab Results  Component Value Date   HDL 35 (L) 06/17/2019   Lab Results  Component Value Date   LDLCALC 129 (H) 06/17/2019   Lab Results  Component Value Date   TRIG 151 (H) 06/17/2019   Lab Results  Component Value Date   CHOLHDL 5.4 (H) 06/17/2019   Lab Results  Component Value Date   HGBA1C 5.0 06/17/2019      Assessment & Plan:   Problem List Items Addressed This Visit      Cardiovascular and Mediastinum   Essential hypertension - Primary    BP Readings from Last 1 Encounters:  01/23/20 125/80   Well-controlled with amlodipine, spironolactone, and clonidine Counseled for compliance with the medications Advised DASH diet and moderate exercise/walking, at least 150 mins/week        Respiratory   COPD with emphysema ([place])    Stable On Wixela        Other   Hyperlipidemia LDL goal <130    On pravastatin Will check lipid profile in next visit      Elevated PSA    Follows up with Urologist Planned to get MRI pelvis       Other Visit Diagnoses    Need for immunization against influenza       Relevant Orders   Flu  Vaccine QUAD 36+ mos IM (Completed)      No orders of the defined types were placed in this encounter.   Follow-up: Return in about 3 months (around 04/24/2020).    Lindell Spar, MD

## 2020-01-23 NOTE — Assessment & Plan Note (Signed)
On pravastatin Will check lipid profile in next visit

## 2020-01-24 NOTE — Telephone Encounter (Signed)
Talked with a supervisor at [facility] and he said with a hip replacement they cannot do MRI and get a good clear picture.

## 2020-01-24 NOTE — Telephone Encounter (Signed)
Can you call [facility] and ask? Hip/knee replacements should be compatible

## 2020-01-24 NOTE — Telephone Encounter (Signed)
Called [facility] to ask about this. Had to leave msg asking them to call us back.

## 2020-02-01 ENCOUNTER — Telehealth: Payer: Self-pay

## 2020-02-01 NOTE — Telephone Encounter (Signed)
What about w/ endorectal coil?

## 2020-02-01 NOTE — Telephone Encounter (Signed)
Called [facility] and someone was going to ask about my question of the endorectal coil and we were disconnected. Called back and left another message.

## 2020-02-03 NOTE — Telephone Encounter (Signed)
Lft msg w/ facility to return call pls several times.

## 2020-02-07 NOTE — Telephone Encounter (Deleted)
Spoke w/ Dr. Alyson Ingles and pain med was refilled.

## 2020-02-08 ENCOUNTER — Telehealth: Payer: Self-pay

## 2020-03-05 NOTE — Telephone Encounter (Signed)
Virtual ov has been scheduled.

## 2020-03-19 ENCOUNTER — Other Ambulatory Visit: Payer: Self-pay | Admitting: Urology

## 2020-03-19 DIAGNOSIS — R972 Elevated prostate specific antigen [PSA]: Secondary | ICD-10-CM

## 2020-04-24 ENCOUNTER — Ambulatory Visit: Payer: BC Managed Care – PPO | Admitting: Internal Medicine

## 2020-04-24 ENCOUNTER — Ambulatory Visit (HOSPITAL_COMMUNITY)
Admission: RE | Admit: 2020-04-24 | Discharge: 2020-04-24 | Disposition: A | Discharge status: Home / Self Care | Payer: BC Managed Care – PPO | Source: Ambulatory Visit | Attending: Internal Medicine | Admitting: Internal Medicine

## 2020-04-24 ENCOUNTER — Encounter: Payer: Self-pay | Admitting: Internal Medicine

## 2020-04-24 ENCOUNTER — Other Ambulatory Visit: Payer: Self-pay

## 2020-04-24 VITALS — BP 127/80 | HR 93 | Temp 98.9°F | Resp 18 | Ht 66.0 in | Wt 219.1 lb

## 2020-04-24 DIAGNOSIS — I10 Essential (primary) hypertension: Secondary | ICD-10-CM

## 2020-04-24 DIAGNOSIS — J438 Other emphysema: Secondary | ICD-10-CM

## 2020-04-24 DIAGNOSIS — M7552 Bursitis of left shoulder: Secondary | ICD-10-CM | POA: Diagnosis not present

## 2020-04-24 DIAGNOSIS — M25512 Pain in left shoulder: Secondary | ICD-10-CM | POA: Insufficient documentation

## 2020-04-24 MED ORDER — IBUPROFEN 600 MG PO TABS: 600.0000 mg | ORAL_TABLET | Freq: Three times a day (TID) | ORAL | 0 refills | Status: DC | PRN

## 2020-04-24 NOTE — Assessment & Plan Note (Signed)
BP Readings from Last 1 Encounters:  04/24/20 127/80   Well-controlled Counseled for compliance with the medications Advised DASH diet and moderate exercise/walking, at least 150 mins/week

## 2020-04-24 NOTE — Progress Notes (Signed)
Established Patient Office Visit  Subjective:  Patient ID: Todd Randall, male    DOB: [birth year]  Age: 65 y.o. MRN: [id number]  CC:  Chief Complaint  Patient presents with  . Follow-up    3 month follow up pt is having left shoulder pain has been going on for 1 month feels like something is disconnected and feels off     HPI Todd Randall is a 65 year old male with PMH of HTN, COPD, HLD and elevated PSA who presents for follow up of his chronic medical conditions.  He c/o left shoulder pain for the last 1 month, which is constant, about 5-6/10, worse with movement. He has not tried any medication yet. Denies any recent injury or heavy lifting. He mentions noticing popping sounds upon movement. Denies any numbness, tingling or weakness.  BP is well-controlled. Takes medications regularly. Patient denies headache, dizziness, chest pain, dyspnea or palpitations.  His breathing has been stable with inhalers.  Past Medical History:  Diagnosis Date  . COPD (chronic obstructive pulmonary disease) ([place])   . Hand pain, right 10/06/2018  . Hyperlipidemia   . Hypertension     Past Surgical History:  Procedure Laterality Date  . COLONOSCOPY N/A 11/10/2012   Procedure: COLONOSCOPY;  Surgeon: Rogene Houston, MD;  [facility];  Service: Endoscopy;  Laterality: N/A;  205-moved to 120 Ann to notify pt  . COLONOSCOPY N/A 10/29/2017   Procedure: COLONOSCOPY;  Surgeon: Rogene Houston, MD;  [facility];  Service: Endoscopy;  Laterality: N/A;  1200  . POLYPECTOMY  10/29/2017   Procedure: POLYPECTOMY;  Surgeon: Rogene Houston, MD;  [facility];  Service: Endoscopy;;  colon  . 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    Family History  Problem Relation Age of Onset  . Colon cancer Mother     Social History   Socioeconomic History  . Marital status: Married    Spouse name: Not on file  . Number of children: Not on file  . Years  of education: Not on file  . Highest education level: Not on file  Occupational History  . Not on file  Tobacco Use  . Smoking status: Current Every Day Smoker    Types: E-cigarettes  . Smokeless tobacco: Never Used  . Tobacco comment: quit 6 yrs ago. He is using the E-cigarettes  Vaping Use  . Vaping Use: Every day  Substance and Sexual Activity  . Alcohol use: No    Alcohol/week: 0.0 standard drinks  . Drug use: No  . Sexual activity: Yes  Other Topics Concern  . Not on file  Social History Narrative  . Not on file   Social Determinants of Health   Financial Resource Strain: Not on file  Food Insecurity: Not on file  Transportation Needs: Not on file  Physical Activity: Not on file  Stress: Not on file  Social Connections: Not on file  Intimate Partner Violence: Not on file    Outpatient Medications Prior to Visit  Medication Sig Dispense Refill  . allopurinol (ZYLOPRIM) 300 MG tablet Take 1 tablet (300 mg total) by mouth daily. 90 tablet 3  . amLODipine (NORVASC) 10 MG tablet Take 1 tablet (10 mg total) by mouth daily. 90 tablet 3  . aspirin 81 MG tablet Take 1 tablet (81 mg total) by mouth daily. 30 tablet   . cholecalciferol (VITAMIN D) 1000 units tablet Take 1,000 Units  by mouth daily.    . cloNIDine (CATAPRES) 0.1 MG tablet Take 1 tablet (0.1 mg total) by mouth at bedtime. 90 tablet 3  . Coenzyme Q10 (COQ10) 100 MG CAPS Take 100 mg by mouth daily.    . Cyanocobalamin (B-12) 2500 MCG TABS Take 2,500 mcg by mouth daily.    . fish oil-omega-3 fatty acids 1000 MG capsule Take 2 g by mouth daily.     . Fluticasone-Salmeterol (WIXELA INHUB) 250-50 MCG/DOSE AEPB USE 1 INHALATION BY MOUTH  TWO TIMES DAILY 180 each 3  . Magnesium 400 MG TABS Take 400 mg by mouth daily.    . Multiple Vitamin (MULTIVITAMIN) tablet Take 1 tablet by mouth daily.    . pravastatin (PRAVACHOL) 10 MG tablet Take 1 tablet (10 mg total) by mouth daily. 90 tablet 3  . spironolactone (ALDACTONE) 25 MG  tablet Take 1 tablet (25 mg total) by mouth daily. 90 tablet 3  . zinc gluconate 50 MG tablet Take 50 mg by mouth daily.     No facility-administered medications prior to visit.    Allergies  Allergen Reactions  . Lisinopril Cough    ROS Review of Systems  Constitutional: Negative for chills and fever.  HENT: Negative for congestion and sore throat.   Eyes: Negative for pain and discharge.  Respiratory: Negative for cough and shortness of breath.   Cardiovascular: Negative for chest pain and palpitations.  Gastrointestinal: Negative for constipation, diarrhea, nausea and vomiting.  Endocrine: Negative for polydipsia and polyuria.  Genitourinary: Negative for dysuria and hematuria.  Musculoskeletal: Negative for neck pain and neck stiffness.       Left shoulder pain  Skin: Negative for rash.  Neurological: Negative for dizziness, weakness, numbness and headaches.  Psychiatric/Behavioral: Negative for agitation and behavioral problems.      Objective:    Physical Exam Vitals reviewed.  Constitutional:      General: He is not in acute distress.    Appearance: He is not diaphoretic.  HENT:     Head: Normocephalic and atraumatic.     Nose: Nose normal.     Mouth/Throat:     Mouth: Mucous membranes are moist.  Eyes:     General: No scleral icterus.    Extraocular Movements: Extraocular movements intact.     Pupils: Pupils are equal, round, and reactive to light.  Cardiovascular:     Rate and Rhythm: Normal rate and regular rhythm.     Pulses: Normal pulses.     Heart sounds: Normal heart sounds. No murmur heard.   Pulmonary:     Breath sounds: Normal breath sounds. No wheezing or rales.  Musculoskeletal:     Right shoulder: Normal. No swelling. Normal range of motion.     Left shoulder: Tenderness (Acromian process area) present. No swelling. Decreased range of motion (Due to pain).     Cervical back: Neck supple. No tenderness.     Right lower leg: No edema.      Left lower leg: No edema.  Skin:    General: Skin is warm.     Findings: No rash.  Neurological:     General: No focal deficit present.     Mental Status: He is alert and oriented to person, place, and time.     Sensory: No sensory deficit.     Motor: No weakness.  Psychiatric:        Mood and Affect: Mood normal.        Behavior: Behavior normal.  BP 127/80 (BP Location: Right Arm, Patient Position: Sitting, Cuff Size: Normal)   Pulse 93   Temp 98.9 F (37.2 C) (Oral)   Resp 18   Ht 5\' 6"  (1.676 m)   Wt 219 lb 1.9 oz (99.4 kg)   SpO2 98%   BMI 35.37 kg/m  Wt Readings from Last 3 Encounters:  04/24/20 219 lb 1.9 oz (99.4 kg)  01/23/20 221 lb (100.2 kg)  01/10/20 215 lb (97.5 kg)     There are no preventive care reminders to display for this patient.  There are no preventive care reminders to display for this patient.  Lab Results  Component Value Date   TSH 1.46 06/17/2019   Lab Results  Component Value Date   WBC 4.9 06/17/2019   HGB 13.8 06/17/2019   HCT 43.4 06/17/2019   MCV 85.4 06/17/2019   PLT 259 06/17/2019   Lab Results  Component Value Date   NA 140 06/17/2019   K 4.5 06/17/2019   CO2 30 06/17/2019   GLUCOSE 98 06/17/2019   BUN 17 06/17/2019   CREATININE 0.98 06/17/2019   BILITOT 0.5 06/17/2019   ALKPHOS 61 11/07/2016   AST 21 06/17/2019   ALT 17 06/17/2019   PROT 7.5 06/17/2019   ALBUMIN 4.7 11/07/2016   CALCIUM 10.3 06/17/2019   Lab Results  Component Value Date   CHOL 190 06/17/2019   Lab Results  Component Value Date   HDL 35 (L) 06/17/2019   Lab Results  Component Value Date   LDLCALC 129 (H) 06/17/2019   Lab Results  Component Value Date   TRIG 151 (H) 06/17/2019   Lab Results  Component Value Date   CHOLHDL 5.4 (H) 06/17/2019   Lab Results  Component Value Date   HGBA1C 5.0 06/17/2019      Assessment & Plan:   Problem List Items Addressed This Visit      Cardiovascular and Mediastinum   Essential  hypertension    BP Readings from Last 1 Encounters:  04/24/20 127/80   Well-controlled Counseled for compliance with the medications Advised DASH diet and moderate exercise/walking, at least 150 mins/week         Respiratory   COPD with emphysema ([place])    Well-controlled Uses Wixela inhaler        Other   Acute pain of left shoulder - Primary    Point tenderness over acromial area Could be subacromian bursitis Conservative measures discussed - heating pad, Ibuprofen/Tylenol and pain relieving cream Check X-ray of the left shoulder as he c/o popping sounds with movements      Relevant Medications   ibuprofen (ADVIL) 600 MG tablet   Other Relevant Orders   DG Shoulder Left    Other Visit Diagnoses    Bursitis of left shoulder   Appears to be the etiology for shoulder pain Would check X-ray of the shoulder first to r/o major abnormality        Meds ordered this encounter  Medications  . ibuprofen (ADVIL) 600 MG tablet    Sig: Take 1 tablet (600 mg total) by mouth every 8 (eight) hours as needed.    Dispense:  30 tablet    Refill:  0    Follow-up: Return in 3 months (on 07/22/2020), or if symptoms worsen or fail to improve.    Lindell Spar, MD

## 2020-04-24 NOTE — Assessment & Plan Note (Signed)
Well-controlled Uses Wixela inhaler

## 2020-04-24 NOTE — Assessment & Plan Note (Signed)
Point tenderness over acromial area Could be subacromian bursitis Conservative measures discussed - heating pad, Ibuprofen/Tylenol and pain relieving cream Check X-ray of the left shoulder as he c/o popping sounds with movements

## 2020-04-24 NOTE — Patient Instructions (Signed)
Please take Ibuprofen as needed for moderate-severe pain.  Please get X-ray of the left shoulder done.  Please avoid any heavy lifting on the left side.

## 2020-05-25 ENCOUNTER — Other Ambulatory Visit: Payer: Self-pay | Admitting: Family Medicine

## 2020-07-09 ENCOUNTER — Other Ambulatory Visit: Payer: BC Managed Care – PPO

## 2020-07-10 ENCOUNTER — Other Ambulatory Visit: Payer: BC Managed Care – PPO

## 2020-07-25 ENCOUNTER — Other Ambulatory Visit: Payer: Self-pay

## 2020-07-25 ENCOUNTER — Encounter: Payer: Self-pay | Admitting: Family Medicine

## 2020-07-25 ENCOUNTER — Ambulatory Visit: Payer: BC Managed Care – PPO | Admitting: Family Medicine

## 2020-07-25 VITALS — BP 139/73 | HR 95 | Resp 16 | Ht 66.5 in | Wt 215.1 lb

## 2020-07-25 DIAGNOSIS — I10 Essential (primary) hypertension: Secondary | ICD-10-CM

## 2020-07-25 DIAGNOSIS — Z1329 Encounter for screening for other suspected endocrine disorder: Secondary | ICD-10-CM | POA: Diagnosis not present

## 2020-07-25 DIAGNOSIS — E785 Hyperlipidemia, unspecified: Secondary | ICD-10-CM

## 2020-07-25 DIAGNOSIS — Z7289 Other problems related to lifestyle: Secondary | ICD-10-CM

## 2020-07-25 DIAGNOSIS — R972 Elevated prostate specific antigen [PSA]: Secondary | ICD-10-CM

## 2020-07-25 DIAGNOSIS — M25512 Pain in left shoulder: Secondary | ICD-10-CM | POA: Diagnosis not present

## 2020-07-25 DIAGNOSIS — G8929 Other chronic pain: Secondary | ICD-10-CM | POA: Insufficient documentation

## 2020-07-25 DIAGNOSIS — R7301 Impaired fasting glucose: Secondary | ICD-10-CM | POA: Diagnosis not present

## 2020-07-25 DIAGNOSIS — Z800 Family history of malignant neoplasm of digestive organs: Secondary | ICD-10-CM

## 2020-07-25 DIAGNOSIS — E669 Obesity, unspecified: Secondary | ICD-10-CM

## 2020-07-25 NOTE — Assessment & Plan Note (Signed)
Controlled, no change in medication DASH diet and commitment to daily physical activity for a minimum of 30 minutes discussed and encouraged, as a part of hypertension management. The importance of attaining a healthy weight is also discussed.  BP/Weight 07/25/2020 04/24/2020 01/23/2020 01/10/2020 06/15/2019 03/22/2019 32/99/2426  Systolic BP - 834 196 222 979 892 119  Diastolic BP - 80 80 79 84 80 80  Wt. (Lbs) 215.12 219.12 221 215 215 220 219.2  BMI 34.2 35.37 35.67 34.18 34.18 34.98 34.85

## 2020-07-25 NOTE — Assessment & Plan Note (Signed)
F/u with urology, importance is stressed

## 2020-07-25 NOTE — Assessment & Plan Note (Signed)
Asked:confirms currently vapes daily Assess: Unwilling to set a quit date, but is cutting back Advise: needs to QUIT to reduce risk of cancer, cardio and cerebrovascular disease Assist: counseled for 5 minutes and literature provided Arrange: follow up in 2 to 4 months

## 2020-07-25 NOTE — Assessment & Plan Note (Signed)
Hyperlipidemia:Low fat diet discussed and encouraged.   Lipid Panel  Lab Results  Component Value Date   CHOL 190 06/17/2019   HDL 35 (L) 06/17/2019   LDLCALC 129 (H) 06/17/2019   TRIG 151 (H) 06/17/2019   CHOLHDL 5.4 (H) 06/17/2019     Updated lab needed

## 2020-07-25 NOTE — Progress Notes (Signed)
Todd Randall     MRN: [id number]      DOB: 1955/04/29   HPI Todd Randall is here for follow up and re-evaluation of chronic medical conditions, medication management and review of any available recent lab and radiology data.  Preventive health is updated, specifically  Cancer screening and Immunization.   Has to work on repeat PSA and appropriate urology follow up Right shouldr and upper arm pain and swelling with reduced mobility x 5 months Poor eating habit,  At 1 am in the morning, and no regular exercise A lot of work stress, managing people, poor sleep which is not new, does not want medication will work on this Still vaping, plans to quit  ovr next 5 months  ROS Denies recent fever or chills. Denies sinus pressure, nasal congestion, ear pain or sore throat. Denies chest congestion, productive cough or wheezing. Denies chest pains, palpitations and leg swelling Denies abdominal pain, nausea, vomiting,diarrhea or constipation.   Denies dysuria, frequency, hesitancy or incontinence. Denies joint pain, swelling and limitation in mobility. Denies headaches, seizures, numbness, or tingling.  Denies skin break down or rash.   PE  BP 139/73   Pulse 95   Resp 16   Ht 5' 6.5" (1.689 m)   Wt 215 lb 1.9 oz (97.6 kg)   SpO2 95%   BMI 34.20 kg/m   Patient alert and oriented and in no cardiopulmonary distress.  HEENT: No facial asymmetry, EOMI,     Neck supple .  Chest: Clear to auscultation bilaterally.  CVS: S1, S2 no murmurs, no S3.Regular rate.  ABD: Soft non tender.   Ext: No edema  MS: Adequate ROM spine,  hips and knees.Decreased ROM left shoulder with swelling over left deltoid and trapezius muscles  Skin: Intact, no ulcerations or rash noted.  Psych: Good eye contact, normal affect. Memory intact not anxious or depressed appearing.  CNS: CN 2-12 intact, power,  normal throughout.no focal deficits noted.   Assessment & Plan  Essential  hypertension Controlled, no change in medication DASH diet and commitment to daily physical activity for a minimum of 30 minutes discussed and encouraged, as a part of hypertension management. The importance of attaining a healthy weight is also discussed.  BP/Weight 07/25/2020 04/24/2020 01/23/2020 01/10/2020 06/15/2019 03/22/2019 23/55/7322  Systolic BP - 025 427 062 376 283 151  Diastolic BP - 80 80 79 84 80 80  Wt. (Lbs) 215.12 219.12 221 215 215 220 219.2  BMI 34.2 35.37 35.67 34.18 34.18 34.98 34.85       Chronic left shoulder pain Got Moderna vaccine 12/12, and since then has had painful swelling of left upper arm with reduced mobility of left shoulder. X ray is normal , soft tissue swelling , life tendonitis present , ortho to eval and manage due to persistent symptom  Elevated PSA F/u with urology, importance is stressed  Obesity (BMI 30.0-34.9)  Patient re-educated about  the importance of commitment to a  minimum of 150 minutes of exercise per week as able.  The importance of healthy food choices with portion control discussed, as well as eating regularly and within a 12 hour window most days. The need to choose "clean , green" food 50 to 75% of the time is discussed, as well as to make water the primary drink and set a goal of 64 ounces water daily.    Weight /BMI 07/25/2020 04/24/2020 01/23/2020  WEIGHT 215 lb 1.9 oz 219 lb 1.9 oz 221 lb  HEIGHT  5' 6.5" 5\' 6"  5\' 6"   BMI 34.2 kg/m2 35.37 kg/m2 35.67 kg/m2      Hyperlipidemia LDL goal <130 Hyperlipidemia:Low fat diet discussed and encouraged.   Lipid Panel  Lab Results  Component Value Date   CHOL 190 06/17/2019   HDL 35 (L) 06/17/2019   LDLCALC 129 (H) 06/17/2019   TRIG 151 (H) 06/17/2019   CHOLHDL 5.4 (H) 06/17/2019     Updated lab needed    Family hx of colon cancer Last colonoscopy was 2019, next due 08/ 2024  Current every day vaping Asked:confirms currently vapes daily Assess: Unwilling to set a quit  date, but is cutting back Advise: needs to QUIT to reduce risk of cancer, cardio and cerebrovascular disease Assist: counseled for 5 minutes and literature provided Arrange: follow up in 2 to 4 months

## 2020-07-25 NOTE — Assessment & Plan Note (Addendum)
Got Moderna vaccine 12/12, and since then has had painful swelling of left upper arm with reduced mobility of left shoulder. X ray is normal , soft tissue swelling , life tendonitis present , ortho to eval and manage due to persistent symptom

## 2020-07-25 NOTE — Patient Instructions (Addendum)
Annual exam in  October, call if you need me sooner  You are referred re left shoulder  Please  F/u with Urology  Fasting cBC, lipid, cmp and EGFr, TSH in the next 1 week  Quit date set for December 15, 2020  Work on eating in the morning  Before you  Leave and not in the middle of the night  It is important that you exercise regularly at least 30 minutes 5 times a week. If you develop chest pain, have severe difficulty breathing, or feel very tired, stop exercising immediately and seek medical attention   Thanks for choosing [facility], we consider it a privelige to serve you.

## 2020-07-25 NOTE — Assessment & Plan Note (Signed)
Last colonoscopy was 2019, next due 08/ 2024

## 2020-07-25 NOTE — Assessment & Plan Note (Signed)
  Patient re-educated about  the importance of commitment to a  minimum of 150 minutes of exercise per week as able.  The importance of healthy food choices with portion control discussed, as well as eating regularly and within a 12 hour window most days. The need to choose "clean , green" food 50 to 75% of the time is discussed, as well as to make water the primary drink and set a goal of 64 ounces water daily.    Weight /BMI 07/25/2020 04/24/2020 01/23/2020  WEIGHT 215 lb 1.9 oz 219 lb 1.9 oz 221 lb  HEIGHT 5' 6.5" 5\' 6"  5\' 6"   BMI 34.2 kg/m2 35.37 kg/m2 35.67 kg/m2

## 2020-07-26 ENCOUNTER — Other Ambulatory Visit: Payer: Self-pay | Admitting: Family Medicine

## 2020-07-26 DIAGNOSIS — I10 Essential (primary) hypertension: Secondary | ICD-10-CM

## 2020-07-26 DIAGNOSIS — J4530 Mild persistent asthma, uncomplicated: Secondary | ICD-10-CM

## 2020-08-01 ENCOUNTER — Other Ambulatory Visit: Payer: Self-pay

## 2020-08-01 ENCOUNTER — Encounter: Payer: Self-pay | Admitting: Orthopedic Surgery

## 2020-08-01 ENCOUNTER — Ambulatory Visit: Payer: BC Managed Care – PPO | Admitting: Orthopedic Surgery

## 2020-08-01 VITALS — Ht 66.5 in | Wt 214.0 lb

## 2020-08-01 DIAGNOSIS — S46012A Strain of muscle(s) and tendon(s) of the rotator cuff of left shoulder, initial encounter: Secondary | ICD-10-CM

## 2020-08-01 MED ORDER — DICLOFENAC SODIUM 50 MG PO TBEC: 50.0000 mg | DELAYED_RELEASE_TABLET | Freq: Two times a day (BID) | ORAL | 0 refills | Status: DC

## 2020-08-01 NOTE — Progress Notes (Signed)
New Patient Visit  Assessment: Todd Randall is a 65 y.o. male with the following: Left rotator cuff tendinitis  Plan: Reviewed radiographs in clinic with the patient today which demonstrates no obvious injuries.  Minimal arthritis is noted.  On physical exam, he has excellent range of motion and strength.  He has mildly restricted range of motion compared to the contralateral side due to some discomfort.  Similarly, provocative testing of the shoulder does recreate pain in the posterior lateral aspect of his shoulder.  After discussing all of her options, he has elected to proceed with a prescription for diclofenac, as well as a home exercise program.  If he continues to have difficulties, we could proceed with a steroid injection.  Formal physical therapy is also consideration.  All this was discussed with the patient and he is amenable to this plan.   Follow-up: Return if symptoms worsen or fail to improve.  Subjective:  Chief Complaint  Patient presents with  . Shoulder Pain    Lt shoulder pain and stiffness for 2-3 months, NKI but states he did receive his last COVID booster in left arm.     History of Present Illness: Todd Randall is a 65 y.o. male who has been referred to clinic today by Tula Nakayama, MD for evaluation of left shoulder pain.  He has had pain and stiffness in the left shoulder for the last 2-3 months.  He denies a specific injury.  He is concerned that the COVID booster initiated the pain in his left shoulder.  He is not taking medications on a consistent basis.  He has never had an injection.  He notes some discomfort in his shoulder, that does worsen at night.  He does have some radiating pain distally.  He also notes slightly restricted range of motion of his left shoulder.   Review of Systems: No fevers or chills No numbness or tingling No chest pain No shortness of breath No bowel or bladder dysfunction No GI distress No headaches   Medical  History:  Past Medical History:  Diagnosis Date  . COPD (chronic obstructive pulmonary disease) ([place])   . Hand pain, right 10/06/2018  . Hyperlipidemia   . Hypertension     Past Surgical History:  Procedure Laterality Date  . COLONOSCOPY N/A 11/10/2012   Procedure: COLONOSCOPY;  Surgeon: Rogene Houston, MD;  Location: [facility];  Service: Endoscopy;  Laterality: N/A;  205-moved to 120 Ann to notify pt  . COLONOSCOPY N/A 10/29/2017   Procedure: COLONOSCOPY;  Surgeon: Rogene Houston, MD;  Location: [facility];  Service: Endoscopy;  Laterality: N/A;  1200  . POLYPECTOMY  10/29/2017   Procedure: POLYPECTOMY;  Surgeon: Rogene Houston, MD;  Location: [facility];  Service: Endoscopy;;  colon  . 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    Family History  Problem Relation Age of Onset  . Colon cancer Mother    Social History   Tobacco Use  . Smoking status: Current Every Day Smoker    Types: E-cigarettes  . Smokeless tobacco: Never Used  . Tobacco comment: quit 6 yrs ago. He is using the E-cigarettes  Vaping Use  . Vaping Use: Every day  Substance Use Topics  . Alcohol use: No    Alcohol/week: 0.0 standard drinks  . Drug use: No    Allergies  Allergen Reactions  . Lisinopril Cough    Current Meds  Medication Sig  .  allopurinol (ZYLOPRIM) 300 MG tablet TAKE 1 TABLET BY MOUTH  DAILY  . amLODipine (NORVASC) 10 MG tablet TAKE 1 TABLET BY MOUTH  DAILY  . aspirin 81 MG tablet Take 1 tablet (81 mg total) by mouth daily.  . cholecalciferol (VITAMIN D) 1000 units tablet Take 1,000 Units by mouth daily.  . cloNIDine (CATAPRES) 0.1 MG tablet TAKE 1 TABLET BY MOUTH AT  BEDTIME  . Coenzyme Q10 (COQ10) 100 MG CAPS Take 100 mg by mouth daily.  . Cyanocobalamin (B-12) 2500 MCG TABS Take 2,500 mcg by mouth daily.  . diclofenac (VOLTAREN) 50 MG EC tablet Take 1 tablet (50 mg total) by mouth 2 (two) times daily.  . fish oil-omega-3 fatty acids 1000 MG  capsule Take 2 g by mouth daily.   . Magnesium 400 MG TABS Take 400 mg by mouth daily.  . Multiple Vitamin (MULTIVITAMIN) tablet Take 1 tablet by mouth daily.  . pravastatin (PRAVACHOL) 10 MG tablet TAKE 1 TABLET BY MOUTH  DAILY  . spironolactone (ALDACTONE) 25 MG tablet TAKE 1 TABLET BY MOUTH  DAILY  . WIXELA INHUB 250-50 MCG/ACT AEPB USE 1 INHALATION BY MOUTH  TWICE DAILY  . zinc gluconate 50 MG tablet Take 50 mg by mouth daily.    Objective: Ht 5' 6.5" (1.689 m)   Wt 214 lb (97.1 kg)   BMI 34.02 kg/m   Physical Exam:  General: Alert and oriented.  No acute distress. Gait: Normal gait  Evaluation of left shoulder demonstrates no deformity.  No atrophy is appreciated.  Mild tenderness to palpation over the posterolateral aspect of the shoulder.  150 degrees of forward flexion compared to 170 on the contralateral side.  Abduction to 90 degrees at his side.  External rotation to side to 40 degrees.  Internal rotation to T12.  Negative belly press.  Strength in his supraspinatus is 5/5, but this does elicit some discomfort.  Infraspinatus is 5/5.  Discomfort with external and internal rotation in the 90/90 position.  Positive O'Brien's test.    IMAGING: I personally reviewed images previously obtained in clinic   X-rays of the left shoulder were previously obtained and demonstrates no acute injuries.  Well-maintained glenohumeral joint space.  There is no proximal humeral migration.   New Medications:  Meds ordered this encounter  Medications  . diclofenac (VOLTAREN) 50 MG EC tablet    Sig: Take 1 tablet (50 mg total) by mouth 2 (two) times daily.    Dispense:  60 tablet    Refill:  [place], MD  08/01/2020 10:49 PM

## 2020-08-01 NOTE — Patient Instructions (Signed)
Rotator Cuff Tear/Tendinitis Rehab   Ask your health care provider which exercises are safe for you. Do exercises exactly as told by your health care provider and adjust them as directed. It is normal to feel mild stretching, pulling, tightness, or discomfort as you do these exercises. Stop right away if you feel sudden pain or your pain gets worse. Do not begin these exercises until told by your health care provider. Stretching and range-of-motion exercises  These exercises warm up your muscles and joints and improve the movement and flexibility of your shoulder. These exercises also help to relieve pain.  Shoulder pendulum In this exercise, you let the injured arm dangle toward the floor and then swing it like a clock pendulum. 1. Stand near a table or counter that you can hold onto for balance. 2. Bend forward at the waist and let your left / right arm hang straight down. Use your other arm to support you and help you stay balanced. 3. Relax your left / right arm and shoulder muscles, and move your hips and your trunk so your left / right arm swings freely. Your arm should swing because of the motion of your body, not because you are using your arm or shoulder muscles. 4. Keep moving your hips and trunk so your arm swings in the following directions, as told by your health care provider: ? Side to side. ? Forward and backward. ? In clockwise and counterclockwise circles. 5. Slowly return to the starting position. Repeat 10 times, or for 10 seconds per direction. Complete this exercise 2-3 times a day.      Shoulder flexion, seated This exercise is sometimes called table slides. In this exercise, you raise your arm in front of your body until you feel a stretch in your injured shoulder. 1. Sit in a stable chair so your left / right forearm can rest on a flat surface. Your elbow should rest at a height that keeps your upper arm next to your body. 2. Keeping your left / right shoulder relaxed,  lean forward at the waist and let your hand slide forward (flexion). Stop when you feel a stretch in your shoulder, or when you reach the angle that is recommended by your health care provider. 3. Hold for 5 seconds. 4. Slowly return to the starting position. Repeat 10 times. Complete this exercise 1-2  times a day.       Shoulder flexion, standing In this exercise, you raise your arm in front of your body (flexion) until you feel a stretch in your injured shoulder. 1. Stand and hold a broomstick, a cane, or a similar object. Place your hands a little more than shoulder-width apart on the object. Your left / right hand should be palm-up, and your other hand should be palm-down. 2. Keep your elbow straight and your shoulder muscles relaxed. Push the stick up with your healthy arm to raise your left / right arm in front of your body, and then over your head until you feel a stretch in your shoulder. ? Avoid shrugging your shoulder while you raise your arm. Keep your shoulder blade tucked down toward the middle of your back. ? Keep your left / right shoulder muscles relaxed. 3. Hold for 10 seconds. 4. Slowly return to the starting position. Repeat 10 times. Complete this exercise 1-2 times a day.      Shoulder abduction, active-assisted You will need a stick, broom handle, or similar object to help you (assist) in doing this  exercise. 1. Lie on your back. This is the supine position. Hold a broomstick, a cane, or a similar object. 2. Place your hands a little more than shoulder-width apart on the object. Your left / right hand should be palm-up, and your other hand should be palm-down. 3. Keeping your shoulder relaxed, push the stick to raise your left / right arm out to your side (abduction) and then over your head. Use your other hand to help move the stick. Stop when you feel a stretch in your shoulder, or when you reach the angle that is recommended by your health care provider. ? Avoid  shrugging your shoulder while you raise your arm. Keep your shoulder blade tucked down toward the middle of your back. 4. Hold for 10 seconds. 5. Slowly return to the starting position. Repeat 10 times. Complete this exercise 1-2 times a day.      Shoulder flexion, active-assisted 1. Lie on your back. You may bend your knees for comfort. 2. Hold a broomstick, a cane, or a similar object so that your hands are about shoulder-width apart. Your palms should face toward your feet. 3. Raise your left / right arm over your head, then behind your head toward the floor (flexion). Use your other hand to help you do this (active-assisted). Stop when you feel a gentle stretch in your shoulder, or when you reach the angle that is recommended by your health care provider. 4. Hold for 10 seconds. 5. Use the stick and your other arm to help you return your left / right arm to the starting position. Repeat 10 times. Complete this exercise 1-2 times a day.      External rotation 1. Sit in a stable chair without armrests, or stand up. 2. Tuck a soft object, such as a folded towel or a small ball, under your left / right upper arm. 3. Hold a broomstick, a cane, or a similar object with your palms face-down, toward the floor. Bend your elbows to a 90-degree angle (right angle), and keep your hands about shoulder-width apart. 4. Straighten your healthy arm and push the stick across your body, toward your left / right side. Keep your left / right arm bent. This will rotate your left / right forearm away from your body (external rotation). 5. Hold for 10 seconds. 6. Slowly return to the starting position. Repeat 10 times. Complete this exercise 1-2 times a day.        Strengthening exercises These exercises build strength and endurance in your shoulder. Endurance is the ability to use your muscles for a long time, even after they get tired. Do not start doing these exercises until your health care provider  approves. Shoulder flexion, isometric 1. Stand or sit in a doorway, facing the door frame. 2. Keep your left / right arm straight and make a gentle fist with your hand. Place your fist against the door frame. Only your fist should be touching the frame. Keep your upper arm at your side. 3. Gently press your fist against the door frame, as if you are trying to raise your arm above your head (isometric shoulder flexion). ? Avoid shrugging your shoulder while you press your hand into the door frame. Keep your shoulder blade tucked down toward the middle of your back. 4. Hold for 10 seconds. 5. Slowly release the tension, and relax your muscles completely before you repeat the exercise. Repeat 10 times. Complete this exercise 3 times per week.  Shoulder abduction, isometric 1. Stand or sit in a doorway. Your left / right arm should be closest to the door frame. 2. Keep your left / right arm straight, and place the back of your hand against the door frame. Only your hand should be touching the frame. Keep the rest of your arm close to your side. 3. Gently press the back of your hand against the door frame, as if you are trying to raise your arm out to the side (isometric shoulder abduction). ? Avoid shrugging your shoulder while you press your hand into the door frame. Keep your shoulder blade tucked down toward the middle of your back. 4. Hold for 10 seconds. 5. Slowly release the tension, and relax your muscles completely before you repeat the exercise. Repeat 10 times. Complete this exercise 3 times per week.      Internal rotation, isometric This is an exercise in which you press your palm against a door frame without moving your shoulder joint (isometric). 1. Stand or sit in a doorway, facing the door frame. 2. Bend your left / right elbow, and place the palm of your hand against the door frame. Only your palm should be touching the frame. Keep your upper arm at your side. 3. Gently press  your hand against the door frame, as if you are trying to push your arm toward your abdomen (internal rotation). Gradually increase the pressure until you are pressing as hard as you can. Stop increasing the pressure if you feel shoulder pain. ? Avoid shrugging your shoulder while you press your hand into the door frame. Keep your shoulder blade tucked down toward the middle of your back. 4. Hold for 10 seconds. 5. Slowly release the tension, and relax your muscles completely before you repeat the exercise. Repeat 10 times. Complete this exercise 3 times per week.      External rotation, isometric This is an exercise in which you press the back of your wrist against a door frame without moving your shoulder joint (isometric). 1. Stand or sit in a doorway, facing the door frame. 2. Bend your left / right elbow and place the back of your wrist against the door frame. Only the back of your wrist should be touching the frame. Keep your upper arm at your side. 3. Gently press your wrist against the door frame, as if you are trying to push your arm away from your abdomen (external rotation). Gradually increase the pressure until you are pressing as hard as you can. Stop increasing the pressure if you feel pain. ? Avoid shrugging your shoulder while you press your wrist into the door frame. Keep your shoulder blade tucked down toward the middle of your back. 4. Hold for 10 seconds. 5. Slowly release the tension, and relax your muscles completely before you repeat the exercise. Repeat 10 times. Complete this exercise 3 times per week.       Scapular retraction 1. Sit in a stable chair without armrests, or stand up. 2. Secure an exercise band to a stable object in front of you so the band is at shoulder height. 3. Hold one end of the exercise band in each hand. Your palms should face down. 4. Squeeze your shoulder blades together (retraction) and move your elbows slightly behind you. Do not shrug your  shoulders upward while you do this. 5. Hold for 10 seconds. 6. Slowly return to the starting position. Repeat 10 times. Complete this exercise 3 times per week.  Shoulder extension 1. Sit in a stable chair without armrests, or stand up. 2. Secure an exercise band to a stable object in front of you so the band is above shoulder height. 3. Hold one end of the exercise band in each hand. 4. Straighten your elbows and lift your hands up to shoulder height. 5. Squeeze your shoulder blades together and pull your hands down to the sides of your thighs (extension). Stop when your hands are straight down by your sides. Do not let your hands go behind your body. 6. Hold for 10 seconds. 7. Slowly return to the starting position. Repeat 10 times. Complete this exercise 3 times per week.       Scapular protraction, supine 1. Lie on your back on a firm surface (supine position). Hold a 5 lbs (or soup can) weight in your left / right hand. 2. Raise your left / right arm straight into the air so your hand is directly above your shoulder joint. 3. Push the weight into the air so your shoulder (scapula) lifts off the surface that you are lying on. The scapula will push up or forward (protraction). Do not move your head, neck, or back. 4. Hold for 10 seconds. 5. Slowly return to the starting position. Let your muscles relax completely before you repeat this exercise.  Repeat 10 times. Complete this exercise 3 times per week.

## 2020-08-03 LAB — CBC WITH DIFFERENTIAL
Basophils Absolute: 0 10*3/uL (ref 0.0–0.2)
Basos: 1 %
EOS (ABSOLUTE): 0.2 10*3/uL (ref 0.0–0.4)
Eos: 4 %
Hematocrit: 43.6 % (ref 37.5–51.0)
Hemoglobin: 13.7 g/dL (ref 13.0–17.7)
Immature Grans (Abs): 0 10*3/uL (ref 0.0–0.1)
Immature Granulocytes: 0 %
Lymphocytes Absolute: 1.5 10*3/uL (ref 0.7–3.1)
Lymphs: 30 %
MCH: 26.9 pg (ref 26.6–33.0)
MCHC: 31.4 g/dL — ABNORMAL LOW (ref 31.5–35.7)
MCV: 86 fL (ref 79–97)
Monocytes Absolute: 0.4 10*3/uL (ref 0.1–0.9)
Monocytes: 8 %
Neutrophils Absolute: 2.8 10*3/uL (ref 1.4–7.0)
Neutrophils: 57 %
RBC: 5.1 x10E6/uL (ref 4.14–5.80)
RDW: 14.5 % (ref 11.6–15.4)
WBC: 4.8 10*3/uL (ref 3.4–10.8)

## 2020-08-03 LAB — LIPID PANEL
Chol/HDL Ratio: 4.3 ratio (ref 0.0–5.0)
Cholesterol, Total: 160 mg/dL (ref 100–199)
HDL: 37 mg/dL — ABNORMAL LOW (ref >39)
LDL Chol Calc (NIH): 100 mg/dL — ABNORMAL HIGH (ref 0–99)
Triglycerides: 126 mg/dL (ref 0–149)
VLDL Cholesterol Cal: 23 mg/dL (ref 5–40)

## 2020-08-03 LAB — CMP14+EGFR
ALT: 14 IU/L (ref 0–44)
AST: 21 IU/L (ref 0–40)
Albumin/Globulin Ratio: 1.9 (ref 1.2–2.2)
Albumin: 5 g/dL — ABNORMAL HIGH (ref 3.8–4.8)
Alkaline Phosphatase: 85 IU/L (ref 44–121)
BUN/Creatinine Ratio: 16 (ref 10–24)
BUN: 14 mg/dL (ref 8–27)
Bilirubin Total: 0.4 mg/dL (ref 0.0–1.2)
CO2: 21 mmol/L (ref 20–29)
Calcium: 10.2 mg/dL (ref 8.6–10.2)
Chloride: 99 mmol/L (ref 96–106)
Creatinine, Ser: 0.85 mg/dL (ref 0.76–1.27)
Globulin, Total: 2.6 g/dL (ref 1.5–4.5)
Glucose: 92 mg/dL (ref 65–99)
Potassium: 4.7 mmol/L (ref 3.5–5.2)
Sodium: 140 mmol/L (ref 134–144)
Total Protein: 7.6 g/dL (ref 6.0–8.5)
eGFR: 98 mL/min/{1.73_m2} (ref >59)

## 2020-08-03 LAB — TSH: TSH: 1.25 u[IU]/mL (ref 0.450–4.500)

## 2020-10-16 IMAGING — CT CT CHEST LUNG CANCER SCREENING LOW DOSE W/O CM
1 series · 10 of 10 positions shown, 13 images · non-contrast
Comparison: Standard CT chest 09/27/2012.

CLINICAL DATA: 63-year-old male with 35 pack year history of
smoking. Lung cancer screening.

EXAM:
CT CHEST WITHOUT CONTRAST LOW-DOSE FOR LUNG CANCER SCREENING
TECHNIQUE: Multidetector CT imaging of the chest was performed following the
standard protocol without IV contrast.

[ct lung segmentation data · axial · 0.76mm/px · z∈[-399,-399]mm · 10 of 319 frames shown]
[frame 1/319  mediastinal]
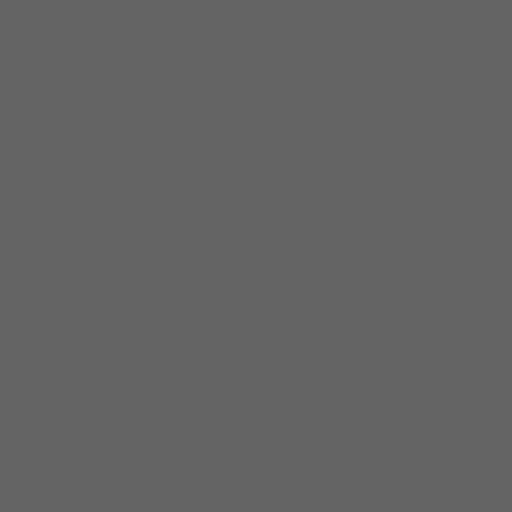
[frame 1/319  lung]
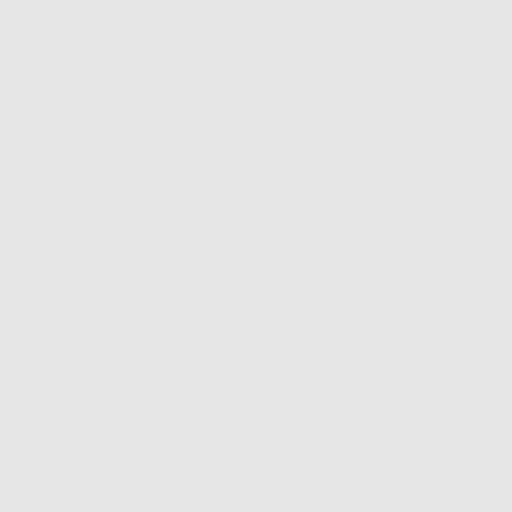
[frame 36/319  lung]
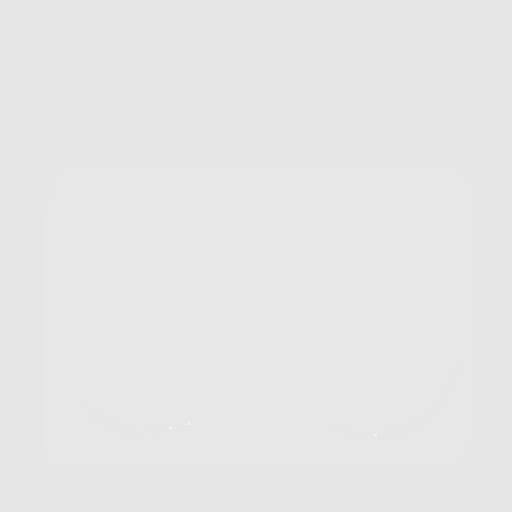
[frame 71/319  lung]
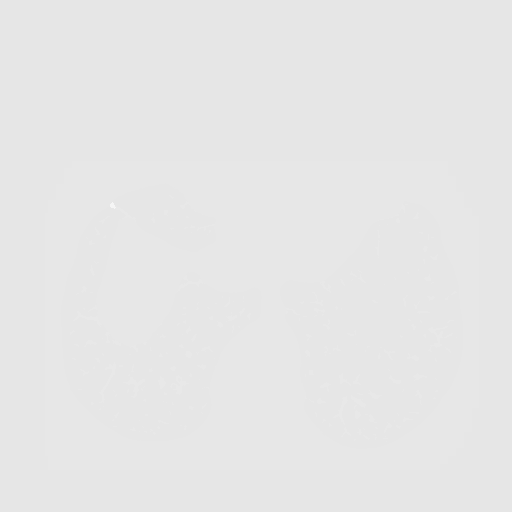
[frame 107/319  lung]
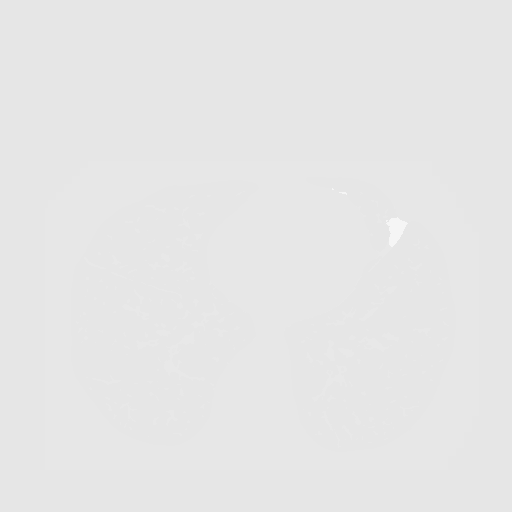
[frame 142/319  mediastinal]
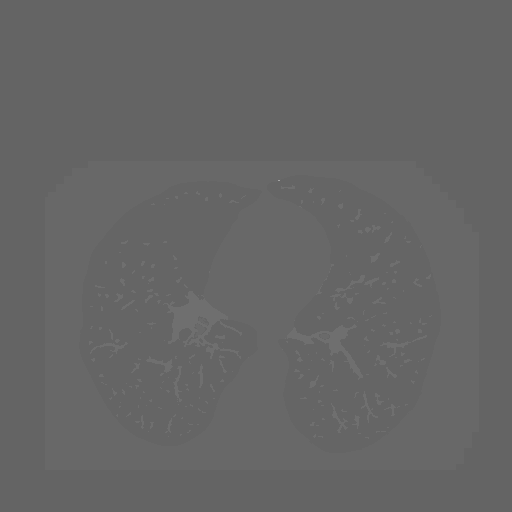
[frame 142/319  lung]
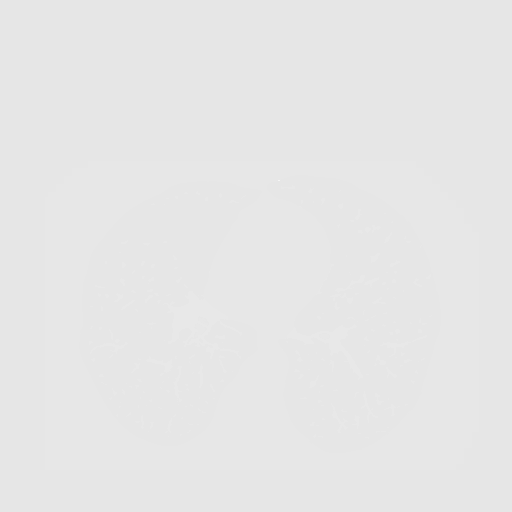
[frame 177/319  lung]
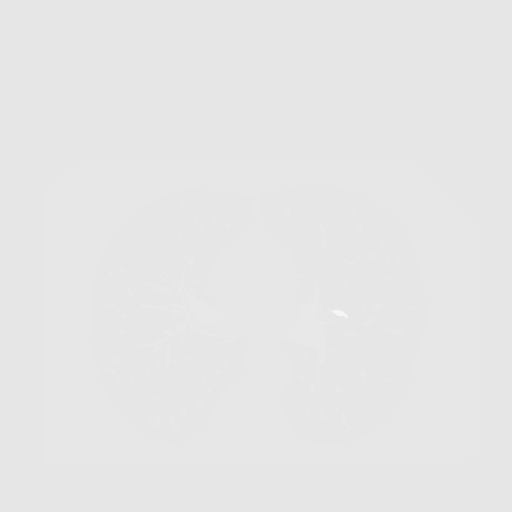
[frame 213/319  lung]
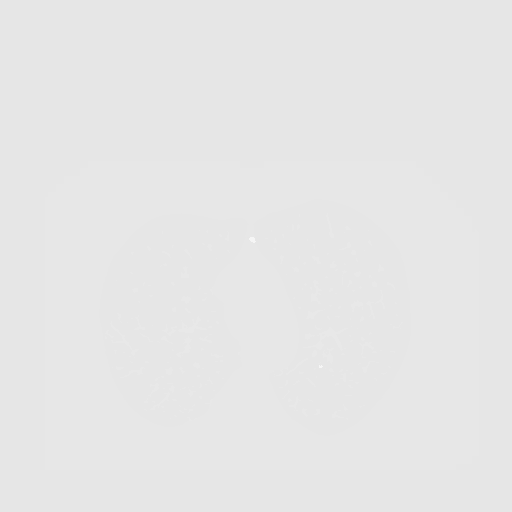
[frame 248/319  lung]
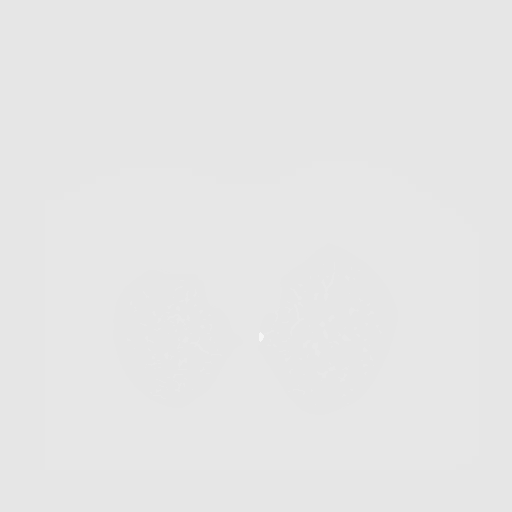
[frame 283/319  mediastinal]
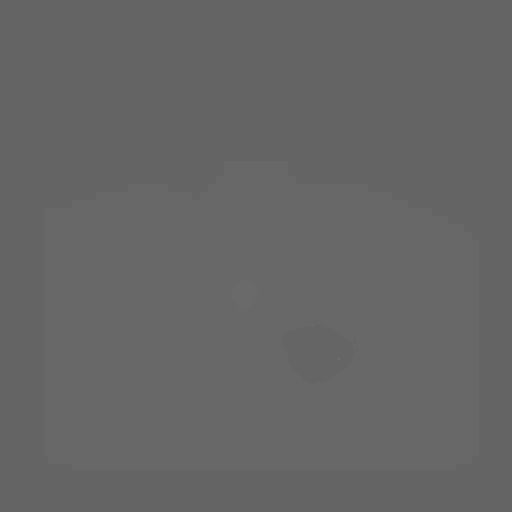
[frame 283/319  lung]
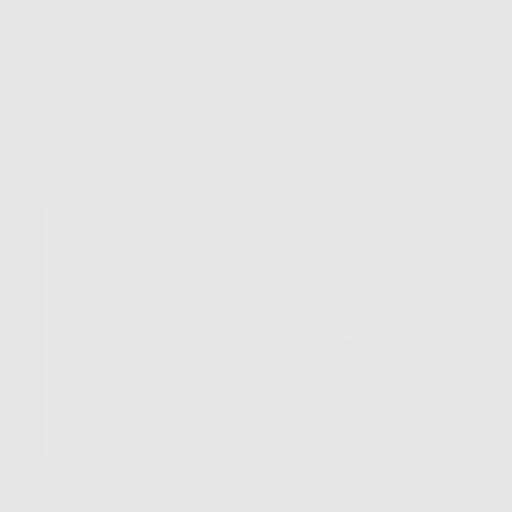
[frame 319/319  lung]
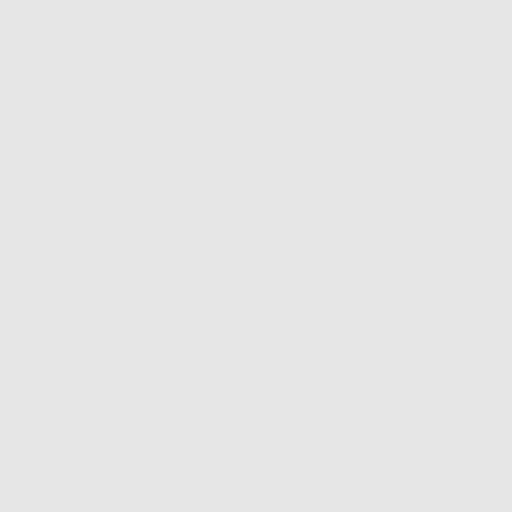

[10 of 10 positions shown; findings below may reference images not displayed]

FINDINGS: Cardiovascular: The heart size is normal. No substantial pericardial
effusion. Atherosclerotic calcification is noted in the wall of the
thoracic aorta.

Mediastinum/Nodes: No mediastinal lymphadenopathy. No evidence for
gross hilar lymphadenopathy although assessment is limited by the
lack of intravenous contrast on today's study. The esophagus has
normal imaging features. There is no axillary lymphadenopathy.

Lungs/Pleura: Centrilobular emphsyema noted. Scattered tiny
bilateral pulmonary nodules are identified measuring up to maximum
volume derived equivalent diameter of 4.3 mm (posterior left upper
lobe on image 109). No overtly suspicious nodule or mass. No focal
airspace consolidation. No pleural effusion. There is some
subsegmental atelectasis or scarring in the lingula posteriorly.

Upper Abdomen: Unremarkable.

Musculoskeletal: No worrisome lytic or sclerotic osseous
abnormality.
IMPRESSION: 1. Lung-RADS 2, benign appearance or behavior. Continue annual
screening with low-dose chest CT without contrast in 12 months.
2.  Emphysema (DKP0O-VXN.4) and Aortic Atherosclerosis (DKP0O-170.0)

## 2020-12-16 ENCOUNTER — Emergency Department (HOSPITAL_COMMUNITY)
Admission: EM | Admit: 2020-12-16 | Discharge: 2020-12-16 | Disposition: A | Discharge status: Home / Self Care | Payer: BC Managed Care – PPO | Attending: Emergency Medicine | Admitting: Emergency Medicine

## 2020-12-16 ENCOUNTER — Encounter (HOSPITAL_COMMUNITY): Payer: Self-pay | Admitting: Emergency Medicine

## 2020-12-16 ENCOUNTER — Emergency Department (HOSPITAL_COMMUNITY): Payer: BC Managed Care – PPO

## 2020-12-16 ENCOUNTER — Other Ambulatory Visit: Payer: Self-pay

## 2020-12-16 DIAGNOSIS — Z79899 Other long term (current) drug therapy: Secondary | ICD-10-CM | POA: Diagnosis not present

## 2020-12-16 DIAGNOSIS — R059 Cough, unspecified: Secondary | ICD-10-CM | POA: Diagnosis not present

## 2020-12-16 DIAGNOSIS — Z20822 Contact with and (suspected) exposure to covid-19: Secondary | ICD-10-CM | POA: Insufficient documentation

## 2020-12-16 DIAGNOSIS — Z96641 Presence of right artificial hip joint: Secondary | ICD-10-CM | POA: Diagnosis not present

## 2020-12-16 DIAGNOSIS — I10 Essential (primary) hypertension: Secondary | ICD-10-CM | POA: Insufficient documentation

## 2020-12-16 DIAGNOSIS — Z7982 Long term (current) use of aspirin: Secondary | ICD-10-CM | POA: Insufficient documentation

## 2020-12-16 DIAGNOSIS — R0602 Shortness of breath: Secondary | ICD-10-CM | POA: Diagnosis not present

## 2020-12-16 DIAGNOSIS — J441 Chronic obstructive pulmonary disease with (acute) exacerbation: Secondary | ICD-10-CM

## 2020-12-16 DIAGNOSIS — F17290 Nicotine dependence, other tobacco product, uncomplicated: Secondary | ICD-10-CM | POA: Diagnosis not present

## 2020-12-16 DIAGNOSIS — J449 Chronic obstructive pulmonary disease, unspecified: Secondary | ICD-10-CM | POA: Insufficient documentation

## 2020-12-16 DIAGNOSIS — R000 Tachycardia, unspecified: Secondary | ICD-10-CM | POA: Insufficient documentation

## 2020-12-16 DIAGNOSIS — Z7951 Long term (current) use of inhaled steroids: Secondary | ICD-10-CM | POA: Insufficient documentation

## 2020-12-16 LAB — RESP PANEL BY RT-PCR (FLU A&B, COVID) ARPGX2
Influenza A by PCR: NEGATIVE
Influenza B by PCR: NEGATIVE
SARS Coronavirus 2 by RT PCR: NEGATIVE

## 2020-12-16 MED ORDER — PREDNISONE 20 MG PO TABS: 40.0000 mg | ORAL_TABLET | Freq: Every day | ORAL | 0 refills | Status: DC

## 2020-12-16 MED ORDER — METHYLPREDNISOLONE SODIUM SUCC 125 MG IJ SOLR
125.0000 mg | Freq: Once | INTRAMUSCULAR | Status: AC
  Administered 2020-12-16: 125 mg via INTRAMUSCULAR
  Filled 2020-12-16: qty 2

## 2020-12-16 MED ORDER — IPRATROPIUM-ALBUTEROL 0.5-2.5 (3) MG/3ML IN SOLN
3.0000 mL | Freq: Once | RESPIRATORY_TRACT | Status: AC
  Administered 2020-12-16: 3 mL via RESPIRATORY_TRACT
  Filled 2020-12-16: qty 3

## 2020-12-16 MED ORDER — ALBUTEROL SULFATE HFA 108 (90 BASE) MCG/ACT IN AERS: 2.0000 | INHALATION_SPRAY | RESPIRATORY_TRACT | 3 refills | Status: AC | PRN

## 2020-12-16 MED ORDER — METHYLPREDNISOLONE SODIUM SUCC 125 MG IJ SOLR: 125.0000 mg | Freq: Once | INTRAMUSCULAR | Status: DC

## 2020-12-16 NOTE — ED Provider Notes (Signed)
[facility] EMERGENCY DEPARTMENT Provider Note   CSN: [id number] Arrival date & time: 12/16/20  1426     History Chief Complaint  Patient presents with   Shortness of Breath    Todd Randall is a 65 y.o. male.  65 year old male with a past medical history significant for COPD presents today for evaluation of 5-day duration of shortness of breath.  On examination he reports a productive cough, but denies palpitations, fever, chills, orthopnea, PND, peripheral edema, or chest pain. He is without other URI symptoms.  He reports compliance with his chronic COPD medications, but has not utilized his albuterol inhaler.  He reports mild dyspnea at rest that worsens with exertion.  He denies known recent sick contacts.   The history is provided by the patient. No language interpreter was used.  Shortness of Breath Severity:  Mild Onset quality:  Sudden Duration:  5 days Timing:  Constant Progression:  Unchanged Chronicity:  New Context: activity   Relieved by:  Nothing Worsened by:  Exertion Ineffective treatments:  None tried Associated symptoms: cough and sputum production   Associated symptoms: no chest pain, no fever, no PND and no wheezing       Past Medical History:  Diagnosis Date   COPD (chronic obstructive pulmonary disease) (HCC)    Hand pain, right 10/06/2018   Hyperlipidemia    Hypertension     Patient Active Problem List   Diagnosis Date Noted   Chronic left shoulder pain 07/25/2020   Elevated PSA 06/15/2019   Impaired fasting glucose 06/15/2019   Hyperlipidemia LDL goal <130 08/13/2015   Nonspecific abnormal electrocardiogram (ECG) (EKG) 04/23/2015   Current every day vaping 06/02/2014   Family hx of colon cancer 10/21/2012   Obesity (BMI 30.0-34.9) 08/02/2012   Essential hypertension 03/31/2007   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/31/2007    Past Surgical History:  Procedure Laterality Date   COLONOSCOPY N/A 11/10/2012   Procedure: COLONOSCOPY;  Surgeon:  Rogene Houston, MD;  Location: [facility];  Service: Endoscopy;  Laterality: N/A;  205-moved to 120 Ann to notify pt   COLONOSCOPY N/A 10/29/2017   Procedure: COLONOSCOPY;  Surgeon: Rogene Houston, MD;  Location: [facility];  Service: Endoscopy;  Laterality: N/A;  1200   POLYPECTOMY  10/29/2017   Procedure: POLYPECTOMY;  Surgeon: Rogene Houston, MD;  Location: [facility];  Service: Endoscopy;;  colon   TOTAL HIP ARTHROPLASTY     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       Family History  Problem Relation Age of Onset   Colon cancer Mother     Social History   Tobacco Use   Smoking status: Every Day    Types: E-cigarettes   Smokeless tobacco: Never   Tobacco comments:    quit 6 yrs ago. He is using the E-cigarettes  Vaping Use   Vaping Use: Every day  Substance Use Topics   Alcohol use: No    Alcohol/week: 0.0 standard drinks   Drug use: No    Home Medications Prior to Admission medications   Medication Sig Start Date End Date Taking? Authorizing Provider  allopurinol (ZYLOPRIM) 300 MG tablet TAKE 1 TABLET BY MOUTH  DAILY 05/25/20   Fayrene Helper, MD  amLODipine (NORVASC) 10 MG tablet TAKE 1 TABLET BY MOUTH  DAILY 05/25/20   Fayrene Helper, MD  aspirin 81 MG tablet Take 1 tablet (81 mg total) by mouth daily. 10/30/17   Rogene Houston, MD  cholecalciferol (VITAMIN D) 1000 units tablet Take 1,000 Units by mouth daily.    [provider]  cloNIDine (CATAPRES) 0.1 MG tablet TAKE 1 TABLET BY MOUTH AT  BEDTIME 07/26/20   Fayrene Helper, MD  Coenzyme Q10 (COQ10) 100 MG CAPS Take 100 mg by mouth daily.    [provider]  Cyanocobalamin (B-12) 2500 MCG TABS Take 2,500 mcg by mouth daily.    [provider]  diclofenac (VOLTAREN) 50 MG EC tablet Take 1 tablet (50 mg total) by mouth 2 (two) times daily. 08/01/20   Mordecai Rasmussen, MD  fish oil-omega-3 fatty acids 1000 MG capsule Take 2 g by mouth daily.     [provider]   Magnesium 400 MG TABS Take 400 mg by mouth daily.    [provider]  Multiple Vitamin (MULTIVITAMIN) tablet Take 1 tablet by mouth daily.    [provider]  pravastatin (PRAVACHOL) 10 MG tablet TAKE 1 TABLET BY MOUTH  DAILY 07/26/20   Fayrene Helper, MD  spironolactone (ALDACTONE) 25 MG tablet TAKE 1 TABLET BY MOUTH  DAILY 07/26/20   Fayrene Helper, MD  Grant Ruts INHUB 250-50 MCG/ACT AEPB USE 1 INHALATION BY MOUTH  TWICE DAILY 07/26/20   Fayrene Helper, MD  zinc gluconate 50 MG tablet Take 50 mg by mouth daily.    [provider]    Allergies    Lisinopril  Review of Systems   Review of Systems  Constitutional:  Negative for activity change, chills and fever.  HENT:  Negative for congestion, postnasal drip and sinus pressure.   Respiratory:  Positive for cough, sputum production and shortness of breath. Negative for chest tightness and wheezing.   Cardiovascular:  Negative for chest pain, palpitations, leg swelling and PND.  Neurological:  Negative for weakness and light-headedness.  All other systems reviewed and are negative.  Physical Exam Updated Vital Signs BP (!) 176/95 (BP Location: Right Arm)   Pulse (!) 112   Temp 99.6 F (37.6 C) (Oral)   Resp 18   SpO2 97%   Physical Exam Vitals and nursing note reviewed.  Constitutional:      General: He is not in acute distress.    Appearance: Normal appearance. He is not ill-appearing.  HENT:     Head: Normocephalic and atraumatic.     Nose: Nose normal.  Eyes:     Conjunctiva/sclera: Conjunctivae normal.  Cardiovascular:     Rate and Rhythm: Regular rhythm. Tachycardia present.  Pulmonary:     Effort: Pulmonary effort is normal. No tachypnea, accessory muscle usage or respiratory distress.     Breath sounds: Decreased breath sounds (diffuse) and wheezing (faint diffuse expiratory wheezes) present. No rales.  Musculoskeletal:        General: No deformity.     Right lower leg: No  edema.     Left lower leg: No edema.  Skin:    Findings: No rash.  Neurological:     General: No focal deficit present.     Mental Status: He is alert. Mental status is at baseline.    ED Results / Procedures / Treatments   Labs (all labs ordered are listed, but only abnormal results are displayed) Labs Reviewed  RESP PANEL BY RT-PCR (FLU A&B, COVID) [id number]    EKG EKG Interpretation  Date/Time:  Sunday December 16 2020 14:35:20 EDT Ventricular Rate:  106 PR Interval:  152 QRS Duration: 84 QT Interval:  322 QTC Calculation: 427 R Axis:  64 Text Interpretation: Sinus tachycardia Otherwise normal ECG Confirmed by Noemi Chapel [phone]) on 12/16/2020 4:10:06 PM  Radiology DG Chest 2 View  Result Date: 12/16/2020 CLINICAL DATA:  Shortness of breath. EXAM: CHEST - 2 VIEW COMPARISON:  CT of the chest 04/07/2019. chest x-ray 08/16/2008. FINDINGS: The heart size and mediastinal contours are within normal limits. There are strandy opacities in the lingula favored is atelectasis or scarring. There is no focal lung consolidation, pleural effusion or pneumothorax. Skeletal structures are unremarkable. IMPRESSION: No active cardiopulmonary disease. Electronically Signed   By: Ronney Asters M.D.   On: 12/16/2020 15:13    Procedures Procedures   Medications Ordered in ED Medications  methylPREDNISolone sodium succinate (SOLU-MEDROL) 125 mg/2 mL injection 125 mg (has no administration in time range)  ipratropium-albuterol (DUONEB) 0.5-2.5 (3) MG/3ML nebulizer solution 3 mL (has no administration in time range)    ED Course  I have reviewed the triage vital signs and the nursing notes.  Pertinent labs & imaging results that were available during my care of the patient were reviewed by me and considered in my medical decision making (see chart for details).    MDM Rules/Calculators/A&P                           Todd Randall is a 65 year old male with a past medical history of COPD who  presented with 5-day duration of productive cough and shortness of breath.  On exam he has faint wheezes present throughout.  Chest x-ray without acute cardial pulm process, EKG unremarkable with sinus tachycardia.  Patient endorses anxiety being in the emergency room.  Low risk for PE. Patient likely is having a mild COPD exacerbation.  O2 sats are 97%.  We will give Solu-Medrol injection, DuoNeb treatment and reevaluate.  Respiratory panel negative.  Discharge per Dr. Sabra Heck.   Final Clinical Impression(s) / ED Diagnoses Final diagnoses:  None    Rx / DC Orders ED Discharge Orders     None        Evlyn Courier, PA-C 12/17/20 1710    Noemi Chapel, MD 12/17/20 1755

## 2020-12-16 NOTE — Discharge Instructions (Addendum)
Your x-ray looks normal with no signs of pneumonia, you do not have COVID based on testing here.  Use the inhaler 2 puffs every 4 hours as needed, take prednisone daily for 5 days.  Emergency department for worsening symptoms otherwise see Dr. Moshe Cipro within 2 or 3 days for recheck

## 2020-12-16 NOTE — ED Provider Notes (Signed)
Medical screening examination/treatment/procedure(s) were conducted as a shared visit with non-physician practitioner(s) and myself.  I personally evaluated the patient during the encounter.  Clinical Impression:   Final diagnoses:  COPD exacerbation ([place])     EKG Interpretation  Date/Time:  Sunday December 16 2020 14:35:20 EDT Ventricular Rate:  106 PR Interval:  152 QRS Duration: 84 QT Interval:  322 QTC Calculation: 427 R Axis:   64 Text Interpretation: Sinus tachycardia Otherwise normal ECG Confirmed by Noemi Chapel [phone]) on 12/16/2020 4:10:06 PM       This patient is a pleasant 65 year old male with a history of COPD, he still vapes, presents with a complaint of shortness of breath, some coughing and congestion.  Denies fevers or chills nausea vomiting and has no swelling of the legs.  On exam he is received a nebulized treatment and some Solu-Medrol, feels better, heart rate is 100, he is afebrile here with negative COVID and flu testing.  Slight prolonged expiration but able to speak in full sentences.  Likely COPD exacerbation, low risk for pulmonary embolism, stable for discharge at this time, patient expresses understanding for the indication for return   Noemi Chapel, MD 12/17/20 1754

## 2020-12-16 NOTE — ED Triage Notes (Signed)
Pt reports SHOB that started on Wednesday. Pt denies swelling in extremities. Denies pain.

## 2020-12-17 ENCOUNTER — Encounter: Payer: BC Managed Care – PPO | Admitting: Family Medicine

## 2021-01-01 ENCOUNTER — Other Ambulatory Visit: Payer: Self-pay

## 2021-01-01 ENCOUNTER — Other Ambulatory Visit: Payer: BC Managed Care – PPO

## 2021-01-01 DIAGNOSIS — R972 Elevated prostate specific antigen [PSA]: Secondary | ICD-10-CM

## 2021-01-02 LAB — PSA: Prostate Specific Ag, Serum: 10.1 ng/mL — ABNORMAL HIGH (ref 0.0–4.0)

## 2021-01-04 ENCOUNTER — Encounter: Payer: Self-pay | Admitting: Family Medicine

## 2021-01-04 ENCOUNTER — Other Ambulatory Visit: Payer: Self-pay

## 2021-01-04 ENCOUNTER — Ambulatory Visit (INDEPENDENT_AMBULATORY_CARE_PROVIDER_SITE_OTHER): Payer: BC Managed Care – PPO | Admitting: Family Medicine

## 2021-01-04 VITALS — BP 136/82 | HR 94 | Resp 18 | Ht 66.5 in | Wt 218.1 lb

## 2021-01-04 DIAGNOSIS — I10 Essential (primary) hypertension: Secondary | ICD-10-CM | POA: Diagnosis not present

## 2021-01-04 DIAGNOSIS — Z23 Encounter for immunization: Secondary | ICD-10-CM | POA: Diagnosis not present

## 2021-01-04 DIAGNOSIS — Z0000 Encounter for general adult medical examination without abnormal findings: Secondary | ICD-10-CM

## 2021-01-04 DIAGNOSIS — Z87891 Personal history of nicotine dependence: Secondary | ICD-10-CM

## 2021-01-04 DIAGNOSIS — E790 Hyperuricemia without signs of inflammatory arthritis and tophaceous disease: Secondary | ICD-10-CM

## 2021-01-04 DIAGNOSIS — E785 Hyperlipidemia, unspecified: Secondary | ICD-10-CM | POA: Diagnosis not present

## 2021-01-04 NOTE — Progress Notes (Signed)
    Todd Randall     MRN: [id number]      DOB: 1955-05-01   HPI: Patient is in for annual physical exam. No other health concerns are expressed or addressed at the visit.  Immunization is reviewed , and  updated .    PE; BP 136/82   Pulse 94   Resp 18   Ht 5' 6.5" (1.689 m)   Wt 218 lb 1.9 oz (98.9 kg)   SpO2 94%   BMI 34.68 kg/m   Pleasant male, alert and oriented x 3, in no cardio-pulmonary distress. Afebrile. HEENT No facial trauma or asymetry. Sinuses non tender. EOMI External ears normal,  Neck: supple, no adenopathy,JVD or thyromegaly.No bruits.  Chest: Clear to ascultation bilaterally.No crackles or wheezes.decreased air entry Non tender to palpation  Cardiovascular system; Heart sounds normal,  S1 and  S2 ,no S3.  No murmur, or thrill. Apical beat not displaced Peripheral pulses normal.  Abdomen: Soft, non tender, no organomegaly or masses. No bruits. Bowel sounds normal. No guarding, tenderness or rebound.    Musculoskeletal exam: Decreased though adequate  ROM of spine, hips , shoulders and knees. No deformity ,swelling or crepitus noted. No muscle wasting or atrophy.   Neurologic: Cranial nerves 2 to 12 intact. Power, tone ,sensation and reflexes normal throughout. No disturbance in gait. No tremor.  Skin: Intact, no ulceration, erythema , scaling or rash noted. Pigmentation normal throughout  Psych; Normal mood and affect. Judgement and concentration normal   Assessment & Plan:  Annual physical exam Annual exam as documented. Counseling done  re healthy lifestyle involving commitment to 150 minutes exercise per week, heart healthy diet, and attaining healthy weight.The importance of adequate sleep also discussed. Regular seat belt use and home safety, is also discussed. Changes in health habits are decided on by the patient with goals and time frames  set for achieving them. Immunization and cancer screening needs are specifically  addressed at this visit.

## 2021-01-04 NOTE — Patient Instructions (Addendum)
Annual exam in 12 months   Follow-up in 6 months.  Call if you need me sooner.   Flu vaccine today.  You need the COVID-vaccine and get this at your pharmacy.  Fasting lipid CMP and EGFR and uric acid level in the next week.  Work on weight loss through increased vegetable intake and less caloric intake.  Also commit to regular exercise.  Please work on quitting vaping.  Chest scan to be scheduled at checkout.  Thanks for choosing [facility], we consider it a privelige to serve you. Best for the season and 2023!

## 2021-01-04 NOTE — Assessment & Plan Note (Signed)

## 2021-01-05 ENCOUNTER — Encounter: Payer: Self-pay | Admitting: Family Medicine

## 2021-01-07 NOTE — Progress Notes (Signed)
History of Present Illness:    Here for follow-up of elevated PSA.  History as follows:   6.25.2020: TRUS/Bx -- all cores negative. PSA 4.2 volume 37 ml, PSAD 0.11. 11.19.2020:PSA 6.5 4.2.2021: PSA 4.3.  10.26.2021: PCA 3 test drawn in early 2021.  It revealed a score of 32 (weakly positive). PSA 8.5 I ordered and MRI of his prostate.  This was never scheduled.  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  10.25.2022: PSA 10.1  Past Medical History:  Diagnosis Date   COPD (chronic obstructive pulmonary disease) (HCC)    Hand pain, right 10/06/2018   Hyperlipidemia    Hypertension     Past Surgical History:  Procedure Laterality Date   COLONOSCOPY N/A 11/10/2012   Procedure: COLONOSCOPY;  Surgeon: Rogene Houston, MD;  [facility]: [facility];  Service: Endoscopy;  Laterality: N/A;  205-moved to 120 Ann to notify pt   COLONOSCOPY N/A 10/29/2017   Procedure: COLONOSCOPY;  Surgeon: Rogene Houston, MD;  Location: [facility];  Service: Endoscopy;  Laterality: N/A;  1200   POLYPECTOMY  10/29/2017   Procedure: POLYPECTOMY;  Surgeon: Rogene Houston, MD;  [facility]: [facility];  Service: Endoscopy;;  colon   TOTAL HIP ARTHROPLASTY     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    Home Medications:  Allergies as of 01/08/2021       Reactions   Lisinopril Cough        Medication List        Accurate as of January 07, 2021  8:06 PM. If you have any questions, ask your nurse or doctor.          albuterol 108 (90 Base) MCG/ACT inhaler Commonly known as: VENTOLIN HFA Inhale 2 puffs into the lungs every 4 (four) hours as needed for wheezing or shortness of breath.   allopurinol 300 MG tablet Commonly known as: ZYLOPRIM TAKE 1 TABLET BY MOUTH  DAILY   amLODipine 10 MG tablet Commonly known as: NORVASC TAKE 1 TABLET BY MOUTH  DAILY   B-12 2500 MCG Tabs Take 2,500 mcg by mouth daily.   cholecalciferol 1000 units  tablet Commonly known as: VITAMIN D Take 1,000 Units by mouth daily.   cloNIDine 0.1 MG tablet Commonly known as: CATAPRES TAKE 1 TABLET BY MOUTH AT  BEDTIME   CoQ10 100 MG Caps Take 100 mg by mouth daily.   fish oil-omega-3 fatty acids 1000 MG capsule Take 2 g by mouth daily.   Magnesium 400 MG Tabs Take 400 mg by mouth daily.   multivitamin tablet Take 1 tablet by mouth daily.   pravastatin 10 MG tablet Commonly known as: PRAVACHOL TAKE 1 TABLET BY MOUTH  DAILY   spironolactone 25 MG tablet Commonly known as: ALDACTONE TAKE 1 TABLET BY MOUTH  DAILY   Wixela Inhub 250-50 MCG/ACT Aepb Generic drug: fluticasone-salmeterol USE 1 INHALATION BY MOUTH  TWICE DAILY What changed: See the new instructions.   zinc gluconate 50 MG tablet Take 50 mg by mouth daily.        Allergies:  Allergies  Allergen Reactions   Lisinopril Cough    Family History  Problem Relation Age of Onset   Colon cancer Mother     Social History:  reports that he has been smoking e-cigarettes. He has never used smokeless tobacco. He reports that he does not drink alcohol and does not use drugs.  ROS: A  complete review of systems was performed.  All systems are negative except for pertinent findings as noted.  Physical Exam:  Vital signs in last 24 hours: There were no vitals taken for this visit. Constitutional:  Alert and oriented, No acute distress Cardiovascular: Regular rate  Respiratory: Normal respiratory effort Lymphatic: No lymphadenopathy Neurologic: Grossly intact, no focal deficits Psychiatric: Normal mood and affect  I have reviewed prior pt notes  I have reviewed urinalysis results  I have reviewed prior PSA results    Impression/Assessment:  Elevating PSA trend with negative biopsy.  He does need further evaluation.  Plan:  I have ordered prostate MRI with endorectal coil, also sent in prescription for diazepam beforehand  We will proceed with probable repeat  transrectal ultrasound, possible fusion biopsy following the MRI.

## 2021-01-08 ENCOUNTER — Encounter: Payer: Self-pay | Admitting: Urology

## 2021-01-08 ENCOUNTER — Ambulatory Visit: Payer: BC Managed Care – PPO | Admitting: Urology

## 2021-01-08 ENCOUNTER — Other Ambulatory Visit: Payer: Self-pay

## 2021-01-08 VITALS — BP 136/83 | HR 86 | Temp 98.5°F | Wt 217.0 lb

## 2021-01-08 DIAGNOSIS — R972 Elevated prostate specific antigen [PSA]: Secondary | ICD-10-CM | POA: Diagnosis not present

## 2021-01-08 MED ORDER — DIAZEPAM 5 MG PO TABS: ORAL_TABLET | ORAL | 0 refills | Status: DC

## 2021-01-08 NOTE — Addendum Note (Signed)
Addended by: Dorisann Frames on: 01/08/2021 01:48 PM   Modules accepted: Orders

## 2021-01-08 NOTE — Progress Notes (Signed)
Urological Symptom Review  Patient is experiencing the following symptoms: Leakage of urine Weak stream   Review of Systems  Gastrointestinal (upper)  : Negative for upper GI symptoms  Gastrointestinal (lower) : Negative for lower GI symptoms  Constitutional : Negative for symptoms  Skin: Negative for skin symptoms  Eyes: Negative for eye symptoms  Ear/Nose/Throat : Negative for Ear/Nose/Throat symptoms  Hematologic/Lymphatic: Negative for Hematologic/Lymphatic symptoms  Cardiovascular : Negative for cardiovascular symptoms  Respiratory : Cough Shortness of breath  Endocrine: Negative for endocrine symptoms  Musculoskeletal: Negative for musculoskeletal symptoms  Neurological: Negative for neurological symptoms  Psychologic: Negative for psychiatric symptoms

## 2021-01-09 LAB — CMP14+EGFR
ALT: 16 IU/L (ref 0–44)
AST: 22 IU/L (ref 0–40)
Albumin/Globulin Ratio: 1.9 (ref 1.2–2.2)
Albumin: 5 g/dL — ABNORMAL HIGH (ref 3.8–4.8)
Alkaline Phosphatase: 84 IU/L (ref 44–121)
BUN/Creatinine Ratio: 16 (ref 10–24)
BUN: 15 mg/dL (ref 8–27)
Bilirubin Total: 0.4 mg/dL (ref 0.0–1.2)
CO2: 27 mmol/L (ref 20–29)
Calcium: 10.1 mg/dL (ref 8.6–10.2)
Chloride: 100 mmol/L (ref 96–106)
Creatinine, Ser: 0.93 mg/dL (ref 0.76–1.27)
Globulin, Total: 2.7 g/dL (ref 1.5–4.5)
Glucose: 79 mg/dL (ref 70–99)
Potassium: 4.8 mmol/L (ref 3.5–5.2)
Sodium: 141 mmol/L (ref 134–144)
Total Protein: 7.7 g/dL (ref 6.0–8.5)
eGFR: 92 mL/min/{1.73_m2} (ref >59)

## 2021-01-09 LAB — LIPID PANEL
Chol/HDL Ratio: 4.4 ratio (ref 0.0–5.0)
Cholesterol, Total: 167 mg/dL (ref 100–199)
HDL: 38 mg/dL — ABNORMAL LOW (ref >39)
LDL Chol Calc (NIH): 106 mg/dL — ABNORMAL HIGH (ref 0–99)
Triglycerides: 130 mg/dL (ref 0–149)
VLDL Cholesterol Cal: 23 mg/dL (ref 5–40)

## 2021-01-09 LAB — URINALYSIS, ROUTINE W REFLEX MICROSCOPIC
Bilirubin, UA: NEGATIVE
Glucose, UA: NEGATIVE
Ketones, UA: NEGATIVE
Leukocytes,UA: NEGATIVE
Nitrite, UA: NEGATIVE
RBC, UA: NEGATIVE
Specific Gravity, UA: 1.02 (ref 1.005–1.030)
Urobilinogen, Ur: 0.2 mg/dL (ref 0.2–1.0)
pH, UA: 7 (ref 5.0–7.5)

## 2021-01-09 LAB — URIC ACID: Uric Acid: 3.8 mg/dL (ref 3.8–8.4)

## 2021-01-16 ENCOUNTER — Other Ambulatory Visit: Payer: Self-pay

## 2021-01-16 ENCOUNTER — Ambulatory Visit (HOSPITAL_COMMUNITY)
Admission: RE | Admit: 2021-01-16 | Discharge: 2021-01-16 | Disposition: A | Discharge status: Home / Self Care | Payer: BC Managed Care – PPO | Source: Ambulatory Visit | Attending: Urology | Admitting: Urology

## 2021-01-16 DIAGNOSIS — R972 Elevated prostate specific antigen [PSA]: Secondary | ICD-10-CM

## 2021-01-16 MED ORDER — GADOBUTROL 1 MMOL/ML IV SOLN
10.0000 mL | Freq: Once | INTRAVENOUS | Status: AC | PRN
  Administered 2021-01-16: 10 mL via INTRAVENOUS

## 2021-01-22 ENCOUNTER — Telehealth: Payer: Self-pay

## 2021-01-22 ENCOUNTER — Other Ambulatory Visit: Payer: Self-pay | Admitting: Urology

## 2021-01-22 DIAGNOSIS — R972 Elevated prostate specific antigen [PSA]: Secondary | ICD-10-CM

## 2021-01-22 MED ORDER — LEVOFLOXACIN 750 MG PO TABS: 750.0000 mg | ORAL_TABLET | Freq: Every day | ORAL | 0 refills | Status: AC

## 2021-01-22 NOTE — Telephone Encounter (Signed)
-----   Message from Franchot Gallo, MD sent at 01/22/2021  2:38 PM EST ----- Notify patient that the MRI did not show any suspicious areas that we need to specifically target with biopsy.  However, I would like to repeat his biopsy.  I put that order in as well as an order for Levaquin, please schedule ----- Message ----- From: Iris Pert, LPN Sent: [phone]   1:01 PM EST To: Franchot Gallo, MD  Please review

## 2021-01-22 NOTE — Telephone Encounter (Signed)
Patient called and made aware. Patient declined to have repeat biopsy at this time.

## 2021-01-22 NOTE — Telephone Encounter (Signed)
See other note

## 2021-03-21 NOTE — Telephone Encounter (Signed)
Patient called and made aware of recommendation. Patient states he does not want to make a f/u appointment at this time.

## 2021-03-26 ENCOUNTER — Other Ambulatory Visit (HOSPITAL_COMMUNITY): Payer: Self-pay

## 2021-03-26 NOTE — Progress Notes (Signed)
Call placed to patient regarding LCS. Patient states this is not a good time to talk. Will try again later.

## 2021-03-29 ENCOUNTER — Other Ambulatory Visit: Payer: Self-pay | Admitting: Family Medicine

## 2021-04-08 ENCOUNTER — Encounter (HOSPITAL_COMMUNITY): Payer: Self-pay

## 2021-04-08 NOTE — Progress Notes (Signed)
Call placed to patient regarding LCS. Patient unable to discuss further at this time.

## 2021-04-16 ENCOUNTER — Telehealth: Payer: Self-pay

## 2021-04-16 NOTE — Telephone Encounter (Signed)
-----   Message from Franchot Gallo, MD sent at 04/16/2021  4:19 PM EST ----- Dear Mr. Postlethwait-  It is come to my attention that you do not want to schedule an MRI for your prostate.  Your PSA has had a significant increase over the past 10 to 15 years, and despite a normal biopsy in the past, I am suspicious that there may be some cancer in your prostate that has been missed.  An MRI is a good way at looking at your prostate to see if there is anything significant that we should rebiopsy.  I have asked my staff to call you to set this up.  Unfortunately, you have refused to have this done.  Prostate cancer went undetected and left to progress on its own can be very dangerous.  You may have undiagnosed prostate cancer at the present time.  If you ignore the signs of progression, which oftentimes is just PSA elevation, that can be dangerous.  I would strongly recommend that you reconsider having this MRI.  If you are getting your urologic care elsewhere, please notify me so that we can forward information to your new doctor.  Sincerely,  Mariann Laster, MD

## 2021-04-16 NOTE — Progress Notes (Signed)
Message received to send patient letter per MD.

## 2021-04-16 NOTE — Telephone Encounter (Signed)
Letter created and mailed.

## 2021-04-20 ENCOUNTER — Encounter: Payer: Self-pay | Admitting: Urology

## 2021-04-22 NOTE — Telephone Encounter (Signed)
Please see patient message.

## 2021-04-24 ENCOUNTER — Encounter (HOSPITAL_COMMUNITY): Payer: Self-pay

## 2021-04-24 NOTE — Progress Notes (Signed)
Call placed to patient regarding LCS referral. Patient wishes to discuss further with PCP. Referral closed at his request.

## 2021-05-13 ENCOUNTER — Telehealth: Payer: Self-pay

## 2021-05-13 DIAGNOSIS — R972 Elevated prostate specific antigen [PSA]: Secondary | ICD-10-CM

## 2021-05-13 MED ORDER — LEVOFLOXACIN 750 MG PO TABS: 750.0000 mg | ORAL_TABLET | Freq: Once | ORAL | 0 refills | Status: AC

## 2021-05-13 NOTE — Telephone Encounter (Signed)
Biopsy scheduled. Orders placed. Prostate biopsy instructions went over with patient via phone, sent via my chart and mail.

## 2021-05-28 ENCOUNTER — Ambulatory Visit (HOSPITAL_BASED_OUTPATIENT_CLINIC_OR_DEPARTMENT_OTHER): Payer: BC Managed Care – PPO | Admitting: Urology

## 2021-05-28 ENCOUNTER — Ambulatory Visit (HOSPITAL_COMMUNITY)
Admission: RE | Admit: 2021-05-28 | Discharge: 2021-05-28 | Disposition: A | Discharge status: Home / Self Care | Payer: BC Managed Care – PPO | Source: Ambulatory Visit | Attending: Urology | Admitting: Urology

## 2021-05-28 ENCOUNTER — Other Ambulatory Visit: Payer: Self-pay

## 2021-05-28 ENCOUNTER — Encounter (HOSPITAL_COMMUNITY): Payer: Self-pay

## 2021-05-28 ENCOUNTER — Other Ambulatory Visit: Payer: Self-pay | Admitting: Urology

## 2021-05-28 DIAGNOSIS — R972 Elevated prostate specific antigen [PSA]: Secondary | ICD-10-CM

## 2021-05-28 MED ORDER — GENTAMICIN SULFATE 40 MG/ML IJ SOLN: 160.0000 mg | Freq: Once | INTRAMUSCULAR | Status: AC

## 2021-05-28 MED ORDER — GENTAMICIN SULFATE 40 MG/ML IJ SOLN
INTRAMUSCULAR | Status: AC
  Administered 2021-05-28: 160 mg via INTRAMUSCULAR
  Filled 2021-05-28: qty 4

## 2021-05-28 MED ORDER — LIDOCAINE HCL (PF) 2 % IJ SOLN
INTRAMUSCULAR | Status: AC
  Administered 2021-05-28: 10 mL
  Filled 2021-05-28: qty 10

## 2021-05-28 MED ORDER — LIDOCAINE HCL (PF) 2 % IJ SOLN: 10.0000 mL | Freq: Once | INTRAMUSCULAR | Status: AC

## 2021-05-28 NOTE — Progress Notes (Signed)
PT tolerated prostate biopsy procedure and antibiotic injections well today. Labs obtained and sent for pathology. PT ambulatory at discharge with no acute distress noted and verbalized understanding of discharge instructions.  

## 2021-05-28 NOTE — Progress Notes (Signed)
Todd Randall is here today for repeat transrectal ultrasound and biopsy of his prostate. ? ?6.25.2020: TRUS/Bx -- all cores negative. PSA 4.2 volume 37 ml, PSAD 0.11. ?11.19.2020:PSA 6.5 ?4.2.2021: PSA 4.3.  ?10.26.2021: PCA 3 test drawn in early 2021.  It revealed a score of 32 (weakly positive). PSA 8.5 ?I ordered and MRI of his prostate.  This was never scheduled.  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 ?10.25.2022: PSA 10.1 ? ?In November, 2022 he underwent prostate MRI because of his elevated PSA trend. ?  ?Susceptibility artifact from right hip prosthesis limits ?visualization the right side of the prostate, greatest on diffusion ?imaging. ?-- Peripheral Zone: The right lateral peripheral zone is obscured by ?artifact from right hip prosthesis. No focal lesion seen on ADC or ?high b-value DWI sequences. ?-- Transition/Central Zone: Moderately enlarged with encapsulated ?BPH nodules noted; however, no suspicious nodules are identified on ?T2-weighted or diffusion sequences. ?-- Measurements/Volume:  4.6 by 4.4 x 5.5 cm (volume = 58 cm^3) ?Transcapsular spread:  Absent ?Seminal vesicle involvement:  Abse grade nt ?Neurovascular bundle involvement:  Absent ?Pelvic adenopathy: None visualized ?Bone metastasis: None visualized ?Other:  Sigmoid diverticulosis, without evidence of diverticulitis. ?IMPRESSION: ?Image degradation due to artifact from right hip prosthesis. ?Otherwise, no radiographic evidence of high-grade prostate ?carcinoma. PI-RADS 1 (v2.1): Very Low (clinically significant cancer ?highly unlikely) ? ? ? ?Risks, benefits, and some of the potential complications of a transrectal ultrasounds of the prostate (TRUSP) with biopsies were discussed at length with the patient including gross hematuria, blood in the bowel movements, hematospermia, bacteremia, infection, voiding discomfort, urinary retention, fever, chills, sepsis, blood transfusion,  death, and others. All questions were answered. Informed consent was obtained. The patient confirmed that he had taken his pre-procedure antibiotic. All anticoagulants were discontinued prior to the procedure. The patient emptied his bladder. He was positioned in a comfortable left lateral decubitus position with hips and knees acutely flexed. ? ?The rectal probe was inserted into the rectum without difficulty. 10cc of 2% Lidocaine without epinephrine was instilled with a spinal needle using ultrasound guidance near the junction of each seminal vesicle and the prostate. ? ?Sequential transverse (axial) scans were made in small increments beginning at the seminal vesicles and ending at the prostatic apex. Sequential longitudinal (saggital) scans were made in small increments beginning at the right lateral prostate and ending at the left lateral prostate. Excellent anatomical imaging was obtained. The peripheral, transitional, and central zones were well-defined. The seminal vesicles were normal. ? ?Prostate volume 50 ml. ? ?There were no hypoechoic areas. 12 needle core biopsies were performed. 1 biopsy each was taken from the following areas: ? ?Right lateral base, right medial base, right lateral mid prostate, right medial mid prostate, right lateral apical prostate, right medial apical prostate, left lateral base, left medial base, left lateral mid prostate, left medial mid prostate, left lateral apical prostate, left medial apical prostate.. Minimal prostatic calcifications were noted. Excellent biopsy specimens were obtained. ? ?Follow-up rectal examination was unremarkable. The procedure was well-tolerated and without complications. ?Antibiotic instructions were given. The patient was told that: ? For several days: ? he should increase his fluid intake and limit strenuous activity ? he might have mild discomfort at the base of his penis or in his rectum ? he might have blood in his urine or blood in his bowel  movements ? For 2-3 months: ? he might have blood in his ejaculate (semen) ? ?  Instructions were given to call the office immedicately for blood clots in the urine or bowel movements, difficulty urinating, inability to urinate, urinary retention, painful or frequent urination, fever, chills, nausea, vomiting, or other illness. The patient stated that he understood these instructions and would comply with them. ?We told the patient that prostate biopsy pathology reports are usually available within 3-5 working days, unless a pathologic second opinion is required, which may take 7-14 days. We told him to contact us to check on the status of his biopsy if he has not heard from [place] within 7 days. ?The patient left the ultrasound examination room in stable condition. ? ? ?

## 2021-05-30 ENCOUNTER — Telehealth: Payer: Self-pay

## 2021-05-30 NOTE — Telephone Encounter (Signed)
-----   Message from Franchot Gallo, MD sent at 05/30/2021  1:13 PM EDT ----- ?I sent my chart message but call pt and let him know bxies all normal--I'd like to see him back in 6 mos ?----- Message ----- ?From: Iris Pert, LPN ?Sent: 05/29/2021   5:42 PM EDT ?To: Franchot Gallo, MD ? ?Please review ? ?

## 2021-05-30 NOTE — Telephone Encounter (Signed)
Patient called and made aware.

## 2021-07-05 ENCOUNTER — Encounter: Payer: Self-pay | Admitting: Family Medicine

## 2021-07-05 ENCOUNTER — Ambulatory Visit: Payer: BC Managed Care – PPO | Admitting: Family Medicine

## 2021-07-05 VITALS — BP 123/78 | HR 96 | Ht 66.0 in | Wt 210.0 lb

## 2021-07-05 DIAGNOSIS — E785 Hyperlipidemia, unspecified: Secondary | ICD-10-CM | POA: Diagnosis not present

## 2021-07-05 DIAGNOSIS — R7301 Impaired fasting glucose: Secondary | ICD-10-CM

## 2021-07-05 DIAGNOSIS — E790 Hyperuricemia without signs of inflammatory arthritis and tophaceous disease: Secondary | ICD-10-CM | POA: Diagnosis not present

## 2021-07-05 DIAGNOSIS — R972 Elevated prostate specific antigen [PSA]: Secondary | ICD-10-CM

## 2021-07-05 DIAGNOSIS — I10 Essential (primary) hypertension: Secondary | ICD-10-CM

## 2021-07-05 DIAGNOSIS — F17200 Nicotine dependence, unspecified, uncomplicated: Secondary | ICD-10-CM | POA: Diagnosis not present

## 2021-07-05 DIAGNOSIS — Z800 Family history of malignant neoplasm of digestive organs: Secondary | ICD-10-CM

## 2021-07-05 DIAGNOSIS — Z7289 Other problems related to lifestyle: Secondary | ICD-10-CM

## 2021-07-05 DIAGNOSIS — E669 Obesity, unspecified: Secondary | ICD-10-CM

## 2021-07-05 NOTE — Assessment & Plan Note (Signed)
Improved, he is applauded on this ? ?Patient re-educated about  the importance of commitment to a  minimum of 150 minutes of exercise per week as able. ? ?The importance of healthy food choices with portion control discussed, as well as eating regularly and within a 12 hour window most days. ?The need to choose "clean , green" food 50 to 75% of the time is discussed, as well as to make water the primary drink and set a goal of 64 ounces water daily. ? ?  ? ?  07/05/2021  ?  8:06 AM 01/08/2021  ? 10:55 AM 01/04/2021  ?  8:03 AM  ?Weight /BMI  ?Weight 210 lb 217 lb 218 lb 1.9 oz  ?Height '5\' 6"'$  (1.676 m)  5' 6.5" (1.689 m)  ?BMI 33.89 kg/m2 34.5 kg/m2 34.68 kg/m2  ? ? ? ?

## 2021-07-05 NOTE — Assessment & Plan Note (Signed)
Asked:confirms currently vaping ?Assess: Unwilling to set a quit date,and is not cutting back ?Advise: needs to QUIT to reduce risk of cancer, cardio and cerebrovascular disease ?Assist: counseled for 5 minutes and literature provided ?Arrange: follow up in 2 to 4 months ? ?

## 2021-07-05 NOTE — Assessment & Plan Note (Signed)
colonscopy due in 2024 ?

## 2021-07-05 NOTE — Assessment & Plan Note (Signed)
mainbtained on allopurinol, has ahd gout ?Updated lab needed at/ before next visit. ? ?

## 2021-07-05 NOTE — Patient Instructions (Addendum)
Annual exam 10/22 or after, call if you need me before ? ?Fasting lipid, cmp and eGFr, Uric acid level today ? ?Chest scan low dose for lung cancer screening to be scheduled  ? ?It is important that you exercise regularly at least 30 minutes 5 times a week. If you develop chest pain, have severe difficulty breathing, or feel very tired, stop exercising immediately and seek medical attention  ? ?Please work on quitting e cigarettes, not beneficial for health ? ? ?Congrats on weight loss ? ?Thanks for choosing [facility], we consider it a privelige to serve you. ? ?

## 2021-07-05 NOTE — Assessment & Plan Note (Signed)
Patient educated about the importance of limiting  Carbohydrate intake , the need to commit to daily physical activity for a minimum of 30 minutes , and to commit weight loss. ?The fact that changes in all these areas will reduce or eliminate all together the development of diabetes is stressed.  ? ? ?  Latest Ref Rng & Units 01/08/2021  ?  3:30 PM 08/02/2020  ?  2:32 PM 06/17/2019  ?  1:53 PM 02/03/2019  ?  2:35 PM 01/01/2018  ?  3:47 PM  ?Diabetic Labs  ?HbA1c <5.7 % of total Hgb   5.0      ?Chol 100 - 199 mg/dL 167   160   190   183   189    ?HDL >39 mg/dL 38   37   35   34   43    ?Calc LDL 0 - 99 mg/dL 106   100   129   123   120    ?Triglycerides 0 - 149 mg/dL 130   126   151   144   147    ?Creatinine 0.76 - 1.27 mg/dL 0.93   0.85   0.98   1.01   0.96    ? ? ?  07/05/2021  ?  8:06 AM 05/28/2021  ?  8:20 AM 01/08/2021  ? 10:55 AM 01/04/2021  ?  8:35 AM 01/04/2021  ?  8:03 AM 12/16/2020  ?  6:34 PM 12/16/2020  ?  2:30 PM  ?BP/Weight  ?Systolic BP 431 540 086 761 142 122 176  ?Diastolic BP 78 71 83 82 82 79 95  ?Wt. (Lbs) 210  217  218.12    ?BMI 33.89 kg/m2  34.5 kg/m2  34.68 kg/m2    ? ?   ? View : No data to display.  ?  ?  ?  ? ? ? ?

## 2021-07-05 NOTE — Assessment & Plan Note (Signed)
Controlled, no change in medication ?DASH diet and commitment to daily physical activity for a minimum of 30 minutes discussed and encouraged, as a part of hypertension management. ?The importance of attaining a healthy weight is also discussed. ? ? ?  07/05/2021  ?  8:06 AM 05/28/2021  ?  8:20 AM 01/08/2021  ? 10:55 AM 01/04/2021  ?  8:35 AM 01/04/2021  ?  8:03 AM 12/16/2020  ?  6:34 PM 12/16/2020  ?  2:30 PM  ?BP/Weight  ?Systolic BP 053 976 734 193 142 122 176  ?Diastolic BP 78 71 83 82 82 79 95  ?Wt. (Lbs) 210  217  218.12    ?BMI 33.89 kg/m2  34.5 kg/m2  34.68 kg/m2    ? ? ? ? ?

## 2021-07-05 NOTE — Assessment & Plan Note (Signed)
Negative biopsy in 2023, mri prostate also reassuring, follows with urology ?

## 2021-07-05 NOTE — Progress Notes (Signed)
? ?Todd Randall     MRN: [id number]      DOB: 1955/03/23 ? ? ?HPI ?Todd Randall is here for follow up and re-evaluation of chronic medical conditions, medication management and review of any available recent lab and radiology data.  ?Preventive health is updated, specifically  Cancer screening and Immunization.   ?Questions or concerns regarding consultations or procedures which the PT has had in the interim are  addressed. ?The PT denies any adverse reactions to current medications since the last visit.  ?There are no new concerns.  ?There are no specific complaints  ? ?ROS ?Denies recent fever or chills. ?Denies sinus pressure, nasal congestion, ear pain or sore throat. ?Denies chest congestion, productive cough or wheezing. ?Denies chest pains, palpitations and leg swelling ?Denies abdominal pain, nausea, vomiting,diarrhea or constipation.   ?Denies dysuria, frequency, hesitancy or incontinence. ?Denies joint pain, swelling and limitation in mobility. ?Denies headaches, seizures, numbness, or tingling. ?Denies depression, anxiety or insomnia. ?Denies skin break down or rash. ? ? ?PE ? ?BP 123/78   Pulse 96   Ht '5\' 6"'$  (1.676 m)   Wt 210 lb (95.3 kg)   SpO2 91%   BMI 33.89 kg/m?  ? ?Patient alert and oriented and in no cardiopulmonary distress. ? ?HEENT: No facial asymmetry, EOMI,     Neck supple . ? ?Chest: Clear to auscultation bilaterally. ? ?CVS: S1, S2 no murmurs, no S3.Regular rate. ? ?ABD: Soft non tender.  ? ?Ext: No edema ? ?MS: Adequate ROM spine, shoulders, hips and knees. ? ?Skin: Intact, no ulcerations or rash noted. ? ?Psych: Good eye contact, normal affect. Memory intact not anxious or depressed appearing. ? ?CNS: CN 2-12 intact, power,  normal throughout.no focal deficits noted. ? ? ?Assessment & Plan ? ?Essential hypertension ?Controlled, no change in medication ?DASH diet and commitment to daily physical activity for a minimum of 30 minutes discussed and encouraged, as a part of hypertension  management. ?The importance of attaining a healthy weight is also discussed. ? ? ?  07/05/2021  ?  8:06 AM 05/28/2021  ?  8:20 AM 01/08/2021  ? 10:55 AM 01/04/2021  ?  8:35 AM 01/04/2021  ?  8:03 AM 12/16/2020  ?  6:34 PM 12/16/2020  ?  2:30 PM  ?BP/Weight  ?Systolic BP 008 676 195 093 142 122 176  ?Diastolic BP 78 71 83 82 82 79 95  ?Wt. (Lbs) 210  217  218.12    ?BMI 33.89 kg/m2  34.5 kg/m2  34.68 kg/m2    ? ? ? ? ? ?Impaired fasting glucose ?Patient educated about the importance of limiting  Carbohydrate intake , the need to commit to daily physical activity for a minimum of 30 minutes , and to commit weight loss. ?The fact that changes in all these areas will reduce or eliminate all together the development of diabetes is stressed.  ? ? ?  Latest Ref Rng & Units 01/08/2021  ?  3:30 PM 08/02/2020  ?  2:32 PM 06/17/2019  ?  1:53 PM 02/03/2019  ?  2:35 PM 01/01/2018  ?  3:47 PM  ?Diabetic Labs  ?HbA1c <5.7 % of total Hgb   5.0      ?Chol 100 - 199 mg/dL 167   160   190   183   189    ?HDL >39 mg/dL 38   37   35   34   43    ?Calc LDL 0 - 99 mg/dL 106  100   129   123   120    ?Triglycerides 0 - 149 mg/dL 130   126   151   144   147    ?Creatinine 0.76 - 1.27 mg/dL 0.93   0.85   0.98   1.01   0.96    ? ? ?  07/05/2021  ?  8:06 AM 05/28/2021  ?  8:20 AM 01/08/2021  ? 10:55 AM 01/04/2021  ?  8:35 AM 01/04/2021  ?  8:03 AM 12/16/2020  ?  6:34 PM 12/16/2020  ?  2:30 PM  ?BP/Weight  ?Systolic BP 518 841 660 630 142 122 176  ?Diastolic BP 78 71 83 82 82 79 95  ?Wt. (Lbs) 210  217  218.12    ?BMI 33.89 kg/m2  34.5 kg/m2  34.68 kg/m2    ? ?   ? View : No data to display.  ?  ?  ?  ? ? ? ? ?Obesity (BMI 30.0-34.9) ?Improved, he is applauded on this ? ?Patient re-educated about  the importance of commitment to a  minimum of 150 minutes of exercise per week as able. ? ?The importance of healthy food choices with portion control discussed, as well as eating regularly and within a 12 hour window most days. ?The need to choose "clean ,  green" food 50 to 75% of the time is discussed, as well as to make water the primary drink and set a goal of 64 ounces water daily. ? ?  ? ?  07/05/2021  ?  8:06 AM 01/08/2021  ? 10:55 AM 01/04/2021  ?  8:03 AM  ?Weight /BMI  ?Weight 210 lb 217 lb 218 lb 1.9 oz  ?Height '5\' 6"'$  (1.676 m)  5' 6.5" (1.689 m)  ?BMI 33.89 kg/m2 34.5 kg/m2 34.68 kg/m2  ? ? ? ? ?Family hx of colon cancer ?colonscopy due in 2024 ? ?Current every day vaping ?Asked:confirms currently vaping ?Assess: Unwilling to set a quit date,and is not cutting back ?Advise: needs to QUIT to reduce risk of cancer, cardio and cerebrovascular disease ?Assist: counseled for 5 minutes and literature provided ?Arrange: follow up in 2 to 4 months ? ? ?Hyperuricemia ?mainbtained on allopurinol, has ahd gout ?Updated lab needed at/ before next visit. ? ? ?

## 2021-07-06 LAB — CMP14+EGFR
ALT: 15 IU/L (ref 0–44)
AST: 20 IU/L (ref 0–40)
Albumin/Globulin Ratio: 1.5 (ref 1.2–2.2)
Albumin: 4.6 g/dL (ref 3.8–4.8)
Alkaline Phosphatase: 80 IU/L (ref 44–121)
BUN/Creatinine Ratio: 21 (ref 10–24)
BUN: 24 mg/dL (ref 8–27)
Bilirubin Total: <0.2 mg/dL (ref 0.0–1.2)
CO2: 23 mmol/L (ref 20–29)
Calcium: 10.4 mg/dL — ABNORMAL HIGH (ref 8.6–10.2)
Chloride: 101 mmol/L (ref 96–106)
Creatinine, Ser: 1.17 mg/dL (ref 0.76–1.27)
Globulin, Total: 3 g/dL (ref 1.5–4.5)
Glucose: 104 mg/dL — ABNORMAL HIGH (ref 70–99)
Potassium: 5.2 mmol/L (ref 3.5–5.2)
Sodium: 141 mmol/L (ref 134–144)
Total Protein: 7.6 g/dL (ref 6.0–8.5)
eGFR: 70 mL/min/{1.73_m2} (ref >59)

## 2021-07-06 LAB — LIPID PANEL
Chol/HDL Ratio: 5.4 ratio — ABNORMAL HIGH (ref 0.0–5.0)
Cholesterol, Total: 162 mg/dL (ref 100–199)
HDL: 30 mg/dL — ABNORMAL LOW (ref >39)
LDL Chol Calc (NIH): 91 mg/dL (ref 0–99)
Triglycerides: 242 mg/dL — ABNORMAL HIGH (ref 0–149)
VLDL Cholesterol Cal: 41 mg/dL — ABNORMAL HIGH (ref 5–40)

## 2021-07-06 LAB — URIC ACID: Uric Acid: 4.8 mg/dL (ref 3.8–8.4)

## 2021-07-09 ENCOUNTER — Other Ambulatory Visit: Payer: Self-pay

## 2021-07-09 MED ORDER — FENOFIBRATE 48 MG PO TABS: ORAL_TABLET | ORAL | 1 refills | Status: DC

## 2021-09-06 ENCOUNTER — Other Ambulatory Visit: Payer: Self-pay | Admitting: Family Medicine

## 2021-09-06 DIAGNOSIS — I10 Essential (primary) hypertension: Secondary | ICD-10-CM

## 2021-09-15 ENCOUNTER — Other Ambulatory Visit: Payer: Self-pay | Admitting: Family Medicine

## 2021-09-15 DIAGNOSIS — J4530 Mild persistent asthma, uncomplicated: Secondary | ICD-10-CM

## 2021-11-19 ENCOUNTER — Ambulatory Visit: Payer: BC Managed Care – PPO | Admitting: Urology

## 2021-11-29 ENCOUNTER — Other Ambulatory Visit: Payer: Self-pay | Admitting: Family Medicine

## 2021-12-02 ENCOUNTER — Encounter: Payer: Self-pay | Admitting: Family Medicine

## 2022-01-08 ENCOUNTER — Ambulatory Visit (INDEPENDENT_AMBULATORY_CARE_PROVIDER_SITE_OTHER): Payer: BC Managed Care – PPO | Admitting: Family Medicine

## 2022-01-08 ENCOUNTER — Encounter: Payer: Self-pay | Admitting: Family Medicine

## 2022-01-08 VITALS — BP 123/82 | HR 96 | Ht 66.0 in | Wt 216.0 lb

## 2022-01-08 DIAGNOSIS — Z23 Encounter for immunization: Secondary | ICD-10-CM | POA: Diagnosis not present

## 2022-01-08 DIAGNOSIS — I10 Essential (primary) hypertension: Secondary | ICD-10-CM | POA: Diagnosis not present

## 2022-01-08 DIAGNOSIS — Z0000 Encounter for general adult medical examination without abnormal findings: Secondary | ICD-10-CM

## 2022-01-08 DIAGNOSIS — E785 Hyperlipidemia, unspecified: Secondary | ICD-10-CM

## 2022-01-08 DIAGNOSIS — R972 Elevated prostate specific antigen [PSA]: Secondary | ICD-10-CM

## 2022-01-08 NOTE — Patient Instructions (Addendum)
F/u in 6 months, call if you need me before  Flu vaccine today  Please get RSV vaccine at the  pharmacy next month    Please get Covid vaccine in January  Need fasting lipid, cmp and EGFR, CBC, PSA as soon as possible  Work on vaping  It is important that you exercise regularly at least 30 minutes 5 times a week. If you develop chest pain, have severe difficulty breathing, or feel very tired, stop exercising immediately and seek medical attention   Please refer for chest scan in January  Thanks for choosing [facility], we consider it a privelige to serve you.

## 2022-01-08 NOTE — Assessment & Plan Note (Signed)
Annual exam as documented. Counseling done  re healthy lifestyle involving commitment to 150 minutes exercise per week, heart healthy diet, and attaining healthy weight.The importance of adequate sleep also discussed. Changes in health habits are decided on by the patient with goals and time frames  set for achieving them. Immunization and cancer screening needs are specifically addressed at this visit. 

## 2022-01-08 NOTE — Progress Notes (Signed)
   Todd Randall     MRN: [id number]      DOB: 1955-11-06   HPI: Patient is in for annual physical exam. No other health concerns are expressed or addressed at the visit. Immunization is reviewed , and  updated if needed.    BP 123/82 (BP Location: Right Arm, Patient Position: Sitting, Cuff Size: Large)   Pulse 96   Ht '5\' 6"'$  (1.676 m)   Wt 216 lb 0.6 oz (98 kg)   SpO2 93%   BMI 34.87 kg/m   Pleasant male, alert and oriented x 3, in no cardio-pulmonary distress. Afebrile. HEENT No facial trauma or asymetry. Sinuses non tender. EOMI External ears normal,  Neck: supple, no adenopathy,JVD or thyromegaly.No bruits.  Chest: Clear to ascultation bilaterally.No crackles or wheezes. Non tender to palpation  Cardiovascular system; Heart sounds normal,  S1 and  S2 ,no S3.  No murmur, or thrill. Apical beat not displaced Peripheral pulses normal.  Abdomen: Soft, non tender, no organomegaly or masses. No bruits. Bowel sounds normal. No guarding, tenderness or rebound.    Musculoskeletal exam: Full ROM of spine, hips , shoulders and knees. No deformity ,swelling or crepitus noted. No muscle wasting or atrophy.   Neurologic: Cranial nerves 2 to 12 intact. Power, tone ,sensation  normal throughout. No disturbance in gait. No tremor.  Skin: Intact, no ulceration, erythema , scaling or rash noted. Pigmentation normal throughout  Psych; Normal mood and affect. Judgement and concentration normal   Assessment & Plan:  Annual physical exam Annual exam as documented. Counseling done  re healthy lifestyle involving commitment to 150 minutes exercise per week, heart healthy diet, and attaining healthy weight.The importance of adequate sleep also discussed.  Changes in health habits are decided on by the patient with goals and time frames  set for achieving them. Immunization and cancer screening needs are specifically addressed at this visit.

## 2022-01-09 ENCOUNTER — Encounter: Payer: BC Managed Care – PPO | Admitting: Family Medicine

## 2022-01-23 LAB — CBC
Hematocrit: 39.7 % (ref 37.5–51.0)
Hemoglobin: 12.8 g/dL — ABNORMAL LOW (ref 13.0–17.7)
MCH: 27.4 pg (ref 26.6–33.0)
MCHC: 32.2 g/dL (ref 31.5–35.7)
MCV: 85 fL (ref 79–97)
Platelets: 251 10*3/uL (ref 150–450)
RBC: 4.67 x10E6/uL (ref 4.14–5.80)
RDW: 14.1 % (ref 11.6–15.4)
WBC: 4.6 10*3/uL (ref 3.4–10.8)

## 2022-01-23 LAB — LIPID PANEL
Chol/HDL Ratio: 4.4 ratio (ref 0.0–5.0)
Cholesterol, Total: 149 mg/dL (ref 100–199)
HDL: 34 mg/dL — ABNORMAL LOW (ref >39)
LDL Chol Calc (NIH): 93 mg/dL (ref 0–99)
Triglycerides: 121 mg/dL (ref 0–149)
VLDL Cholesterol Cal: 22 mg/dL (ref 5–40)

## 2022-01-23 LAB — CMP14+EGFR
ALT: 16 IU/L (ref 0–44)
AST: 23 IU/L (ref 0–40)
Albumin/Globulin Ratio: 1.8 (ref 1.2–2.2)
Albumin: 4.6 g/dL (ref 3.9–4.9)
Alkaline Phosphatase: 66 IU/L (ref 44–121)
BUN/Creatinine Ratio: 14 (ref 10–24)
BUN: 14 mg/dL (ref 8–27)
Bilirubin Total: 0.4 mg/dL (ref 0.0–1.2)
CO2: 24 mmol/L (ref 20–29)
Calcium: 10.1 mg/dL (ref 8.6–10.2)
Chloride: 99 mmol/L (ref 96–106)
Creatinine, Ser: 1 mg/dL (ref 0.76–1.27)
Globulin, Total: 2.6 g/dL (ref 1.5–4.5)
Glucose: 84 mg/dL (ref 70–99)
Potassium: 4.5 mmol/L (ref 3.5–5.2)
Sodium: 138 mmol/L (ref 134–144)
Total Protein: 7.2 g/dL (ref 6.0–8.5)
eGFR: 84 mL/min/{1.73_m2} (ref >59)

## 2022-01-23 LAB — PSA: Prostate Specific Ag, Serum: 5.7 ng/mL — ABNORMAL HIGH (ref 0.0–4.0)

## 2022-03-01 ENCOUNTER — Other Ambulatory Visit: Payer: Self-pay | Admitting: Family Medicine

## 2022-03-25 ENCOUNTER — Other Ambulatory Visit: Payer: Self-pay

## 2022-03-25 DIAGNOSIS — Z87891 Personal history of nicotine dependence: Secondary | ICD-10-CM

## 2022-03-25 DIAGNOSIS — Z122 Encounter for screening for malignant neoplasm of respiratory organs: Secondary | ICD-10-CM

## 2022-03-25 NOTE — Progress Notes (Signed)
LDCT order placed per protocol. Patient scheduled for 05/06/22 at 0800

## 2022-05-06 ENCOUNTER — Ambulatory Visit (HOSPITAL_COMMUNITY)
Admission: RE | Admit: 2022-05-06 | Discharge: 2022-05-06 | Disposition: A | Discharge status: Home / Self Care | Payer: BC Managed Care – PPO | Source: Ambulatory Visit | Attending: Physician Assistant | Admitting: Physician Assistant

## 2022-05-06 DIAGNOSIS — Z122 Encounter for screening for malignant neoplasm of respiratory organs: Secondary | ICD-10-CM | POA: Diagnosis present

## 2022-05-06 DIAGNOSIS — Z87891 Personal history of nicotine dependence: Secondary | ICD-10-CM

## 2022-05-08 ENCOUNTER — Other Ambulatory Visit: Payer: Self-pay | Admitting: Family Medicine

## 2022-05-08 DIAGNOSIS — I2510 Atherosclerotic heart disease of native coronary artery without angina pectoris: Secondary | ICD-10-CM

## 2022-05-12 NOTE — Progress Notes (Signed)
Patient notified of LDCT Lung Cancer Screening Results via mail with the recommendation to follow-up in 12 months. Patient's referring provider has been sent a copy of results. Results are as follows:   IMPRESSION: 1. Lung-RADS 2S, benign appearance or behavior. Continue annual screening with low-dose chest CT without contrast in 12 months. 2. The "S" modifier above refers to potentially clinically significant non lung cancer related findings. Specifically, there is aortic atherosclerosis, in addition to two-vessel coronary artery disease. Please note that although the presence of coronary artery calcium documents the presence of coronary artery disease, the severity of this disease and any potential stenosis cannot be assessed on this non-gated CT examination. Assessment for potential risk factor modification, dietary therapy or pharmacologic therapy may be warranted, if clinically indicated. 3. Mild diffuse bronchial wall thickening with mild centrilobular and moderate paraseptal emphysema; imaging findings suggestive of underlying COPD.   Aortic Atherosclerosis (ICD10-I70.0) and Emphysema (ICD10-J43.9).

## 2022-05-15 ENCOUNTER — Encounter: Payer: Self-pay | Admitting: Radiology

## 2022-05-22 ENCOUNTER — Encounter: Payer: Self-pay | Admitting: Internal Medicine

## 2022-05-22 ENCOUNTER — Ambulatory Visit: Payer: BC Managed Care – PPO | Attending: Internal Medicine | Admitting: Internal Medicine

## 2022-05-22 VITALS — BP 110/80 | HR 86 | Ht 66.5 in | Wt 219.6 lb

## 2022-05-22 DIAGNOSIS — I10 Essential (primary) hypertension: Secondary | ICD-10-CM

## 2022-05-22 DIAGNOSIS — I2584 Coronary atherosclerosis due to calcified coronary lesion: Secondary | ICD-10-CM

## 2022-05-22 DIAGNOSIS — I2510 Atherosclerotic heart disease of native coronary artery without angina pectoris: Secondary | ICD-10-CM | POA: Diagnosis not present

## 2022-05-22 DIAGNOSIS — R0602 Shortness of breath: Secondary | ICD-10-CM | POA: Diagnosis not present

## 2022-05-22 MED ORDER — METOPROLOL TARTRATE 100 MG PO TABS: 100.0000 mg | ORAL_TABLET | Freq: Once | ORAL | 0 refills | Status: DC

## 2022-05-22 NOTE — Patient Instructions (Addendum)
Medication Instructions:  Your physician recommends that you continue on your current medications as directed. Please refer to the Current Medication list given to you today.  Labwork: BMET 1-2 weeks before CT scan Non-fasting [facility] ([place]. )  Testing/Procedures: Coronary CTA-see instructions below  Follow-Up: Your physician recommends that you schedule a follow-up appointment in: pending  Any Other Special Instructions Will Be Listed Below (If Applicable).  If you need a refill on your cardiac medications before your next appointment, please call your pharmacy.    Your cardiac CT will be scheduled at one of the below locations:   [facility] [address] [place], [place] [address] [phone]  If scheduled at [facility], please arrive at the [facility] and Children's Entrance (Entrance C2) of [facility] 30 minutes prior to test start time. You can use the FREE valet parking offered at entrance C (encouraged to control the heart rate for the test)  Proceed to the [facility] Radiology Department (first floor) to check-in and test prep.  All radiology patients and guests should use entrance C2 at [facility], accessed from [facility], even though the hospital's physical address listed is [address]     Please follow these instructions carefully (unless otherwise directed):  Hold all erectile dysfunction medications at least 3 days (72 hrs) prior to test. (Ie viagra, cialis, sildenafil, tadalafil, etc) We will administer nitroglycerin during this exam.   On the Night Before the Test: Be sure to Drink plenty of water. Do not consume any caffeinated/decaffeinated beverages or chocolate 12 hours prior to your test. Do not take any antihistamines 12 hours prior to your test. If the patient has contrast allergy:  On the Day of the Test: Drink plenty of water until 1 hour prior to the  test. Do not eat any food 1 hour prior to test. You may take your regular medications prior to the test.  Take metoprolol (Lopressor) two hours prior to test. If you take Furosemide/Hydrochlorothiazide/Spironolactone, please HOLD on the morning of the test.     After the Test: Drink plenty of water. After receiving IV contrast, you may experience a mild flushed feeling. This is normal. On occasion, you may experience a mild rash up to 24 hours after the test. This is not dangerous. If this occurs, you can take Benadryl 25 mg and increase your fluid intake. If you experience trouble breathing, this can be serious. If it is severe call 911 IMMEDIATELY. If it is mild, please call our office.  We will call to schedule your test 2-4 weeks out understanding that some insurance companies will need an authorization prior to the service being performed.   For non-scheduling related questions, please contact the cardiac imaging nurse navigator should you have any questions/concerns: Marchia Bond, Cardiac Imaging Nurse Navigator Gordy Clement, Cardiac Imaging Nurse Navigator [facility] Direct Office Dial: [phone]   For scheduling needs, including cancellations and rescheduling, please call [place], [phone].

## 2022-05-22 NOTE — Progress Notes (Signed)
Cardiology Office Note  Date: 05/22/2022   ID: Jeferson, Reymond [birth year], MRN [id number]  PCP:  Fayrene Helper, MD  Cardiologist:  Chalmers Guest, MD Electrophysiologist:  None   Reason for Office Visit: Evaluation of CT calcification at the request of Dr. Moshe Cipro   History of Present Illness: Todd Randall is a 67 y.o. male known to have HTN, HLD, COPD was referred to cardiology clinic for evaluation of CAD calcification.  Patient was initially referred to cardiology clinic in 2017 for exertional dyspnea for which NM stress test was performed and showed no evidence of ischemia. Symptoms were deemed to be secondary to COPD and was discharged from the clinic. Patient underwent CT imaging chest on 05/06/2022 through his PCP which showed calcified atherosclerotic plaque in the LAD and RCA arteries. He continues to have exertional dyspnea symptoms, especially walking into [employer] store which makes him feel short of breath sometimes.  Although he has shortness of breath, he thinks all his symptoms could be from COPD. He currently vapes with 3 mg of nicotine.  Denies any angina, dizziness, syncope, palpitations and leg swelling.  Past Medical History:  Diagnosis Date   COPD (chronic obstructive pulmonary disease) (HCC)    Hand pain, right 10/06/2018   Hyperlipidemia    Hypertension     Past Surgical History:  Procedure Laterality Date   COLONOSCOPY N/A 11/10/2012   Procedure: COLONOSCOPY;  Surgeon: Rogene Houston, MD;  [facility];  Service: Endoscopy;  Laterality: N/A;  205-moved to 120 Ann to notify pt   COLONOSCOPY N/A 10/29/2017   Procedure: COLONOSCOPY;  Surgeon: Rogene Houston, MD;  [facility];  Service: Endoscopy;  Laterality: N/A;  1200   POLYPECTOMY  10/29/2017   Procedure: POLYPECTOMY;  Surgeon: Rogene Houston, MD;  [facility];  Service: Endoscopy;;  colon   TOTAL HIP ARTHROPLASTY     Rt hip in 2003 for avascular  necrosis.    Current Outpatient Medications  Medication Sig Dispense Refill   albuterol (VENTOLIN HFA) 108 (90 Base) MCG/ACT inhaler Inhale 2 puffs into the lungs every 4 (four) hours as needed for wheezing or shortness of breath. 1 each 3   allopurinol (ZYLOPRIM) 300 MG tablet TAKE 1 TABLET BY MOUTH DAILY 90 tablet 3   amLODipine (NORVASC) 10 MG tablet TAKE 1 TABLET BY MOUTH DAILY 90 tablet 3   cholecalciferol (VITAMIN D) 1000 units tablet Take 1,000 Units by mouth daily.     cloNIDine (CATAPRES) 0.1 MG tablet TAKE 1 TABLET BY MOUTH AT  BEDTIME 90 tablet 3   Coenzyme Q10 (COQ10) 100 MG CAPS Take 100 mg by mouth daily.     Cyanocobalamin (B-12) 2500 MCG TABS Take 2,500 mcg by mouth daily.     fenofibrate (TRICOR) 48 MG tablet TAKE 1 TABLET BY MOUTH DAILY 90 tablet 3   fish oil-omega-3 fatty acids 1000 MG capsule Take 2 g by mouth daily.      Magnesium 400 MG TABS Take 400 mg by mouth daily.     Multiple Vitamin (MULTIVITAMIN) tablet Take 1 tablet by mouth daily.     pravastatin (PRAVACHOL) 10 MG tablet TAKE 1 TABLET BY MOUTH  DAILY 90 tablet 3   spironolactone (ALDACTONE) 25 MG tablet TAKE 1 TABLET BY MOUTH  DAILY 90 tablet 3   WIXELA INHUB 250-50 MCG/ACT AEPB USE 1 INHALATION BY MOUTH  TWICE DAILY 180 each 3   zinc gluconate 50 MG  tablet Take 50 mg by mouth daily.     No current facility-administered medications for this visit.   Allergies:  Lisinopril   Social History: The patient  reports that he has been smoking e-cigarettes. He has never used smokeless tobacco. He reports that he does not drink alcohol and does not use drugs.   Family History: The patient's family history includes Colon cancer in his mother.   ROS:  Please see the history of present illness. Otherwise, complete review of systems is positive for none.  All other systems are reviewed and negative.   Physical Exam: VS:  BP 110/80   Pulse 86   Ht 5' 6.5" (1.689 m)   Wt 219 lb 9.6 oz (99.6 kg)   SpO2 96%   BMI  34.91 kg/m , BMI Body mass index is 34.91 kg/m.  Wt Readings from Last 3 Encounters:  05/22/22 219 lb 9.6 oz (99.6 kg)  01/08/22 216 lb 0.6 oz (98 kg)  07/05/21 210 lb (95.3 kg)    General: Patient appears comfortable at rest. HEENT: Conjunctiva and lids normal, oropharynx clear with moist mucosa. Neck: Supple, no elevated JVP or carotid bruits, no thyromegaly. Lungs: Clear to auscultation, nonlabored breathing at rest. Cardiac: Regular rate and rhythm, no S3 or significant systolic murmur, no pericardial rub. Abdomen: Soft, nontender, no hepatomegaly, bowel sounds present, no guarding or rebound. Extremities: No pitting edema, distal pulses 2+. Skin: Warm and dry. Musculoskeletal: No kyphosis. Neuropsychiatric: Alert and oriented x3, affect grossly appropriate.  ECG: NSR  Recent Labwork: 01/22/2022: ALT 16; AST 23; BUN 14; Creatinine, Ser 1.00; Hemoglobin 12.8; Platelets 251; Potassium 4.5; Sodium 138     Component Value Date/Time   CHOL 149 01/22/2022 1512   TRIG 121 01/22/2022 1512   HDL 34 (L) 01/22/2022 1512   CHOLHDL 4.4 01/22/2022 1512   CHOLHDL 5.4 (H) 06/17/2019 1353   VLDL 16 11/07/2016 1647   LDLCALC 93 01/22/2022 1512   LDLCALC 129 (H) 06/17/2019 1353    Other Studies Reviewed Today: NM stress test in 2017 No evidence of ischemia  Assessment and Plan: Patient is a 67 year old M known to have HTN, HLD, COPD was referred to cardiology clinic for presence of coronary artery calcifications on CT scan.  # CT evidence of calcified atherosclerotic plaque in the LAD and RCA -Patient was initially referred to cardiology clinic in 2017 for evaluation of DOE, NM stress test showed notice of ischemia and his symptoms were deemed to be secondary to COPD. Patient continues to have exertional dyspnea and thinks his symptoms could be from COPD. As DOE is an anginal equivalent, will obtain CTA cardiac for definitive evaluation of calcified atherosclerotic plaque in the LAD and  RCA vessels.  # HTN, controlled -Continue amlodipine 10 mg once daily, spironolactone 25 mg once daily.  Management of HTN per PCP.  # HLD, not at goal -Continue fenofibrate 48 mg once daily and pravastatin 10 mg once daily.  HLD management per PCP.  Goal LDL less than 100.  # Nicotine abuse -Smoking cessation counseling provided, patient currently vapes with 3 mg nicotine. Smoking cessation instruction/counseling given:  counseled patient on the dangers of tobacco use, advised patient to stop smoking, and reviewed strategies to maximize success.  Patient motivated to quit.  I have spent a total of 45 minutes with patient reviewing chart, EKGs, labs and examining patient as well as establishing an assessment and plan that was discussed with the patient.  > 50% of time was spent in  direct patient care.      Medication Adjustments/Labs and Tests Ordered: Current medicines are reviewed at length with the patient today.  Concerns regarding medicines are outlined above.   Tests Ordered: Orders Placed This Encounter  Procedures   EKG 12-Lead    Medication Changes: No orders of the defined types were placed in this encounter.   Disposition:  Follow up  pending results  [place], MD, 05/22/2022 2:47 PM    [facility] [place], [place], [place] [address]

## 2022-05-29 ENCOUNTER — Telehealth (HOSPITAL_COMMUNITY): Payer: Self-pay | Admitting: *Deleted

## 2022-05-29 ENCOUNTER — Telehealth (HOSPITAL_COMMUNITY): Payer: Self-pay | Admitting: Emergency Medicine

## 2022-05-29 NOTE — Telephone Encounter (Signed)
Attempted to call patient regarding upcoming cardiac CT appointment. °Left message on voicemail with name and callback number °Nour Scalise RN Navigator Cardiac Imaging °[facility] °[phone] Office °[phone] Cell ° °

## 2022-05-29 NOTE — Telephone Encounter (Signed)
Patient returning call about his upcoming cardiac imaging study; pt verbalizes understanding of appt date/time, parking situation and where to check in, pre-test NPO status and medications ordered, and verified current allergies; name and call back number provided for further questions should they arise  Gordy Clement RN Navigator Cardiac Imaging [facility] [phone] office [phone] cell  Patient to hold his spironolactone and take '100mg'$  metoprolol tartrate two hours prior to his cardiac CT scan. He is aware to arrive at 12pm.

## 2022-05-30 ENCOUNTER — Ambulatory Visit (HOSPITAL_COMMUNITY)
Admission: RE | Admit: 2022-05-30 | Discharge: 2022-05-30 | Disposition: A | Discharge status: Home / Self Care | Payer: BC Managed Care – PPO | Source: Ambulatory Visit | Attending: Internal Medicine | Admitting: Internal Medicine

## 2022-05-30 DIAGNOSIS — I700 Atherosclerosis of aorta: Secondary | ICD-10-CM | POA: Diagnosis not present

## 2022-05-30 DIAGNOSIS — I2510 Atherosclerotic heart disease of native coronary artery without angina pectoris: Secondary | ICD-10-CM | POA: Insufficient documentation

## 2022-05-30 DIAGNOSIS — R0602 Shortness of breath: Secondary | ICD-10-CM | POA: Insufficient documentation

## 2022-05-30 LAB — BASIC METABOLIC PANEL
BUN/Creatinine Ratio: 11 (ref 10–24)
BUN: 14 mg/dL (ref 8–27)
CO2: 22 mmol/L (ref 20–29)
Calcium: 10 mg/dL (ref 8.6–10.2)
Chloride: 104 mmol/L (ref 96–106)
Creatinine, Ser: 1.33 mg/dL — ABNORMAL HIGH (ref 0.76–1.27)
Glucose: 96 mg/dL (ref 70–99)
Potassium: 4 mmol/L (ref 3.5–5.2)
Sodium: 144 mmol/L (ref 134–144)
eGFR: 60 mL/min/{1.73_m2} (ref >59)

## 2022-05-30 MED ORDER — NITROGLYCERIN 0.4 MG SL SUBL: 0.8000 mg | SUBLINGUAL_TABLET | SUBLINGUAL | Status: DC | PRN

## 2022-05-30 MED ORDER — IOHEXOL 350 MG/ML SOLN
100.0000 mL | Freq: Once | INTRAVENOUS | Status: AC | PRN
  Administered 2022-05-30: 100 mL via INTRAVENOUS

## 2022-05-30 MED ORDER — NITROGLYCERIN 0.4 MG SL SUBL
SUBLINGUAL_TABLET | SUBLINGUAL | Status: AC
  Administered 2022-05-30: 0.8 mg via SUBLINGUAL
  Filled 2022-05-30: qty 2

## 2022-06-27 ENCOUNTER — Emergency Department (HOSPITAL_COMMUNITY): Payer: BC Managed Care – PPO

## 2022-06-27 ENCOUNTER — Emergency Department (HOSPITAL_COMMUNITY)
Admission: EM | Admit: 2022-06-27 | Discharge: 2022-06-27 | Disposition: A | Discharge status: Home / Self Care | Payer: BC Managed Care – PPO | Attending: Emergency Medicine | Admitting: Emergency Medicine

## 2022-06-27 ENCOUNTER — Other Ambulatory Visit: Payer: Self-pay

## 2022-06-27 DIAGNOSIS — Y990 Civilian activity done for income or pay: Secondary | ICD-10-CM | POA: Diagnosis not present

## 2022-06-27 DIAGNOSIS — W500XXA Accidental hit or strike by another person, initial encounter: Secondary | ICD-10-CM | POA: Insufficient documentation

## 2022-06-27 DIAGNOSIS — I10 Essential (primary) hypertension: Secondary | ICD-10-CM | POA: Insufficient documentation

## 2022-06-27 DIAGNOSIS — M25511 Pain in right shoulder: Secondary | ICD-10-CM | POA: Insufficient documentation

## 2022-06-27 DIAGNOSIS — Z79899 Other long term (current) drug therapy: Secondary | ICD-10-CM | POA: Insufficient documentation

## 2022-06-27 MED ORDER — NAPROXEN 500 MG PO TABS: 500.0000 mg | ORAL_TABLET | Freq: Two times a day (BID) | ORAL | 0 refills | Status: DC

## 2022-06-27 NOTE — ED Notes (Signed)
Patient transported to CT 

## 2022-06-27 NOTE — Discharge Instructions (Addendum)
You were evaluated today for right-sided shoulder pain.  Your workup is concerning for possible soft tissue injury.  I recommend following up with orthopedics for further evaluation and management.  Please use a sling for rest and comfort.  Please use the prescribed anti-inflammatories as directed.  Consider icing your shoulder for 20 minutes at a time multiple times throughout the day.
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
## 2022-07-01 ENCOUNTER — Encounter: Payer: Self-pay | Admitting: Orthopedic Surgery

## 2022-07-01 ENCOUNTER — Ambulatory Visit (INDEPENDENT_AMBULATORY_CARE_PROVIDER_SITE_OTHER): Payer: Worker's Compensation | Admitting: Orthopedic Surgery

## 2022-07-01 VITALS — BP 147/84 | HR 98 | Wt 218.0 lb

## 2022-07-01 DIAGNOSIS — M25511 Pain in right shoulder: Secondary | ICD-10-CM | POA: Diagnosis not present
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
## 2022-07-01 NOTE — Patient Instructions (Signed)

## 2022-07-10 ENCOUNTER — Ambulatory Visit: Payer: BC Managed Care – PPO | Admitting: Family Medicine

## 2022-07-10 ENCOUNTER — Encounter: Payer: Self-pay | Admitting: Family Medicine

## 2022-07-10 VITALS — BP 128/80 | HR 89 | Ht 66.0 in | Wt 218.0 lb

## 2022-07-10 DIAGNOSIS — E785 Hyperlipidemia, unspecified: Secondary | ICD-10-CM | POA: Diagnosis not present

## 2022-07-10 DIAGNOSIS — Z1329 Encounter for screening for other suspected endocrine disorder: Secondary | ICD-10-CM | POA: Diagnosis not present

## 2022-07-10 DIAGNOSIS — E790 Hyperuricemia without signs of inflammatory arthritis and tophaceous disease: Secondary | ICD-10-CM | POA: Diagnosis not present

## 2022-07-10 DIAGNOSIS — I10 Essential (primary) hypertension: Secondary | ICD-10-CM

## 2022-07-10 DIAGNOSIS — M25511 Pain in right shoulder: Secondary | ICD-10-CM

## 2022-07-10 DIAGNOSIS — E669 Obesity, unspecified: Secondary | ICD-10-CM

## 2022-07-10 DIAGNOSIS — R972 Elevated prostate specific antigen [PSA]: Secondary | ICD-10-CM

## 2022-07-10 NOTE — Patient Instructions (Addendum)
Annual exam in early Novemebr, call if you needme sooner  Congrats on quitting nicotine since March, 2024  It is important that you exercise regularly at least 30 minutes 5 times a week. If you develop chest pain, have severe difficulty breathing, or feel very tired, stop exercising immediately and seek medical attention   Fasting lipid, cm-p and eGFr, TSH, uric acid level   as soon as possible  Thanks for choosing [facility], we consider it a privelige to serve you.
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
## 2022-07-15 ENCOUNTER — Encounter: Payer: Self-pay | Admitting: Orthopedic Surgery

## 2022-07-15 ENCOUNTER — Ambulatory Visit (INDEPENDENT_AMBULATORY_CARE_PROVIDER_SITE_OTHER): Payer: Self-pay | Admitting: Orthopedic Surgery

## 2022-07-15 DIAGNOSIS — M25511 Pain in right shoulder: Secondary | ICD-10-CM
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
## 2022-07-15 NOTE — Addendum Note (Signed)
Addended by: Baird Kay on: 07/15/2022 04:07 PM   Modules accepted: Orders

## 2022-07-15 NOTE — Patient Instructions (Signed)
Please provide a note for work.  Okay to return to work.  No lifting anything greater than 10 pounds.  He should not be lifting anything 10 pounds or less repetitively.   We will work to obtain an MRI, and meet to discuss the findings.

## 2022-07-16 ENCOUNTER — Encounter: Payer: Self-pay | Admitting: Family Medicine

## 2022-07-16 DIAGNOSIS — M25511 Pain in right shoulder: Secondary | ICD-10-CM | POA: Insufficient documentation

## 2022-07-16 NOTE — Assessment & Plan Note (Signed)
Updated lab needed at/ before next visit. No recent goput flare

## 2022-07-16 NOTE — Assessment & Plan Note (Signed)
Controlled, no change in medication DASH diet and commitment to daily physical activity for a minimum of 30 minutes discussed and encouraged, as a part of hypertension management. The importance of attaining a healthy weight is also discussed.     07/10/2022    8:53 AM 07/10/2022    8:07 AM 07/10/2022    8:06 AM 07/01/2022    2:02 PM 06/27/2022    1:41 PM 06/27/2022   11:12 AM 06/27/2022   11:11 AM  BP/Weight  Systolic BP 128 127 144 147 155  139  Diastolic BP 80 82 84 84 91  76  Wt. (Lbs)   218.04 218  210   BMI   35.19 kg/m2 34.14 kg/m2  32.89 kg/m2

## 2022-07-16 NOTE — Assessment & Plan Note (Signed)
Vbeing managed by ortho and is a workmans comp case

## 2022-07-16 NOTE — Assessment & Plan Note (Signed)
  Patient re-educated about  the importance of commitment to a  minimum of 150 minutes of exercise per week as able.  The importance of healthy food choices with portion control discussed, as well as eating regularly and within a 12 hour window most days. The need to choose "clean , green" food 50 to 75% of the time is discussed, as well as to make water the primary drink and set a goal of 64 ounces water daily.       07/10/2022    8:06 AM 07/01/2022    2:02 PM 06/27/2022   11:12 AM  Weight /BMI  Weight 218 lb 0.6 oz 218 lb 210 lb  Height 5\' 6"  (1.676 m)  5\' 7"  (1.702 m)  BMI 35.19 kg/m2 34.14 kg/m2 32.89 kg/m2    unchanged

## 2022-07-16 NOTE — Assessment & Plan Note (Signed)
Hyperlipidemia:Low fat diet discussed and encouraged.   Lipid Panel  Lab Results  Component Value Date   CHOL 149 01/22/2022   HDL 34 (L) 01/22/2022   LDLCALC 93 01/22/2022   TRIG 121 01/22/2022   CHOLHDL 4.4 01/22/2022     Updated lab needed at/ before next visit.

## 2022-07-29 ENCOUNTER — Encounter: Payer: Self-pay | Admitting: Orthopedic Surgery

## 2022-07-29 MED ORDER — NAPROXEN 500 MG PO TABS: 500.0000 mg | ORAL_TABLET | Freq: Two times a day (BID) | ORAL | 0 refills | Status: DC

## 2022-08-08 ENCOUNTER — Encounter: Payer: Self-pay | Admitting: Orthopedic Surgery

## 2022-08-08 ENCOUNTER — Ambulatory Visit (INDEPENDENT_AMBULATORY_CARE_PROVIDER_SITE_OTHER): Payer: Worker's Compensation | Admitting: Orthopedic Surgery

## 2022-08-08 DIAGNOSIS — M75121 Complete rotator cuff tear or rupture of right shoulder, not specified as traumatic: Secondary | ICD-10-CM | POA: Diagnosis not present
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
## 2022-08-08 NOTE — Patient Instructions (Signed)
    Please provide a note for work.  Okay to return to work.  No lifting anything greater than 10 pounds.  He should not be lifting anything 10 pounds or less repetitively.  Restrictions to remain in place until he is evaluated by another provider.  I am happy to discuss treatment further, after you have been evaluated and provided with a second opinion.

## 2022-08-22 ENCOUNTER — Other Ambulatory Visit: Payer: Self-pay | Admitting: Orthopedic Surgery

## 2022-08-22 ENCOUNTER — Encounter: Payer: Self-pay | Admitting: Orthopedic Surgery

## 2022-08-22 MED ORDER — NAPROXEN 500 MG PO TABS: 500.0000 mg | ORAL_TABLET | Freq: Two times a day (BID) | ORAL | 0 refills | Status: DC

## 2022-08-25 MED ORDER — NAPROXEN 500 MG PO TABS
500.0000 mg | ORAL_TABLET | Freq: Two times a day (BID) | ORAL | 0 refills | Status: DC
Start: 2022-08-25 — End: 2023-03-05

## 2022-09-15 HISTORY — PX: ROTATOR CUFF REPAIR: SHX139

## 2022-09-16 ENCOUNTER — Other Ambulatory Visit: Payer: Self-pay | Admitting: Family Medicine

## 2022-09-16 DIAGNOSIS — J4530 Mild persistent asthma, uncomplicated: Secondary | ICD-10-CM

## 2022-09-16 DIAGNOSIS — I10 Essential (primary) hypertension: Secondary | ICD-10-CM

## 2022-10-06 ENCOUNTER — Encounter (INDEPENDENT_AMBULATORY_CARE_PROVIDER_SITE_OTHER): Payer: Self-pay | Admitting: *Deleted

## 2022-10-30 ENCOUNTER — Encounter: Payer: Self-pay | Admitting: Family Medicine

## 2022-11-07 ENCOUNTER — Telehealth (INDEPENDENT_AMBULATORY_CARE_PROVIDER_SITE_OTHER): Payer: Self-pay | Admitting: *Deleted

## 2022-11-07 DIAGNOSIS — Z86010 Personal history of colonic polyps: Secondary | ICD-10-CM

## 2022-11-07 NOTE — Telephone Encounter (Signed)
Referring MD/PCP: [employer]  Insurance: Charlotte Crumb [phone]  Best Phone Number: [phone]  Reason for the colonoscopy hx polyps - 5 yr TCS recall  Has patient had this procedure before?  Yes, 2019  If so, when, by whom and where?    Is there a family history of colon cancer?  no  Who?  What age when diagnosed?    Is patient diabetic? If yes, Type 1 or Type 2   no      Does patient have prosthetic heart valve or mechanical valve?  no  Do you have a pacemaker/defibrillator?  no  Has patient ever had endocarditis/atrial fibrillation? no  Has patient had joint replacement within last 12 months?  no  Is patient constipated or do they take laxatives? no  Does patient have a history of alcohol/drug use?  yes  Does patient use oxygen? no  Have you had a stroke/heart attack last 6 mths? no  Do you take medicine for weight loss?  no  For male patients,: have you had a hysterectomy                       are you post menopausal                       do you still have your menstrual cycle   Do you take any blood-thinning medications such as: (aspirin, warfarin, Plavix, Aggrenox)  yes  If yes we need the name, milligram, dosage and who is prescribing doctor asa 81 mg daily  Medications: clonidine 0.1 mg at bedtime, pravastatin 10 mg daily, spironolactone 25 mg daily, fluticasone 1 inhalation twice daily, naproxen 500 mg twice daily, amlodipine 10 mg daily, allopurinol 300 mg daily, fenofibrate 40 mg daily, albuterol 108 inhaler 2 puffs every 4 hours, cyanocobalamin 2500 mcg daily, Co Q 10 100 mg daily, Vit D 1000 units daily, magnesium 400 mg daily, Multi vit daily, zinc 50 mg daily, fish oil 1000 mg daily  Allergies: lisinopril   Pharmacy: [facility]

## 2022-11-07 NOTE — Telephone Encounter (Signed)
 Ok to schedule.  Room 2  Thanks,  Vista Lawman, MD Gastroenterology and Hepatology [facility] Gastroenterology

## 2022-11-20 NOTE — Telephone Encounter (Signed)
Will call once get Oct schedule

## 2022-12-11 MED ORDER — PEG 3350-KCL-NA BICARB-NACL 420 G PO SOLR: 4000.0000 mL | Freq: Once | ORAL | 0 refills | Status: AC

## 2022-12-11 NOTE — Addendum Note (Signed)
Addended by: Armstead Peaks on: 12/11/2022 11:41 AM   Modules accepted: Orders

## 2022-12-11 NOTE — Telephone Encounter (Addendum)
Spoke with pt. He has been scheduled for 10/22. Aware will send instructions to him. Aware needs BMET prior. Placed order for [facility]. Rx for prep sent to pharmacy.  Per [employer] "The following solutions for the service date entered do not require Pre-Authorization by [employer]. Please note that benefit limits, if applicable, will still be applied. Contact the health plan using the number on the back of the member's ID card if you have any questions regarding coverage or Pre-Authorization requirements."

## 2022-12-15 ENCOUNTER — Encounter: Payer: Self-pay | Admitting: Family Medicine

## 2022-12-16 NOTE — Telephone Encounter (Signed)
Questionnaire from recall, no referral needed  

## 2023-01-01 LAB — BASIC METABOLIC PANEL
BUN: 17 mg/dL (ref 7–25)
CO2: 30 mmol/L (ref 20–32)
Calcium: 10.4 mg/dL — ABNORMAL HIGH (ref 8.6–10.3)
Chloride: 100 mmol/L (ref 98–110)
Creat: 0.96 mg/dL (ref 0.70–1.35)
Glucose, Bld: 100 mg/dL — ABNORMAL HIGH (ref 65–99)
Potassium: 4.8 mmol/L (ref 3.5–5.3)
Sodium: 139 mmol/L (ref 135–146)

## 2023-01-04 ENCOUNTER — Other Ambulatory Visit: Payer: Self-pay | Admitting: Family Medicine

## 2023-01-06 ENCOUNTER — Encounter (HOSPITAL_COMMUNITY)
Admission: RE | Disposition: A | Discharge status: Home / Self Care | Payer: Self-pay | Source: Home / Self Care | Attending: Gastroenterology

## 2023-01-06 ENCOUNTER — Ambulatory Visit (HOSPITAL_COMMUNITY)
Admission: RE | Admit: 2023-01-06 | Discharge: 2023-01-06 | Disposition: A | Discharge status: Home / Self Care | Payer: BC Managed Care – PPO | Attending: Gastroenterology | Admitting: Gastroenterology

## 2023-01-06 ENCOUNTER — Ambulatory Visit (HOSPITAL_COMMUNITY): Payer: BC Managed Care – PPO | Admitting: Certified Registered"

## 2023-01-06 ENCOUNTER — Encounter (HOSPITAL_COMMUNITY): Payer: Self-pay

## 2023-01-06 ENCOUNTER — Other Ambulatory Visit: Payer: Self-pay

## 2023-01-06 DIAGNOSIS — J449 Chronic obstructive pulmonary disease, unspecified: Secondary | ICD-10-CM | POA: Insufficient documentation

## 2023-01-06 DIAGNOSIS — Z860101 Personal history of adenomatous and serrated colon polyps: Secondary | ICD-10-CM | POA: Diagnosis not present

## 2023-01-06 DIAGNOSIS — E785 Hyperlipidemia, unspecified: Secondary | ICD-10-CM | POA: Diagnosis not present

## 2023-01-06 DIAGNOSIS — Z1211 Encounter for screening for malignant neoplasm of colon: Secondary | ICD-10-CM | POA: Diagnosis not present

## 2023-01-06 DIAGNOSIS — I2510 Atherosclerotic heart disease of native coronary artery without angina pectoris: Secondary | ICD-10-CM | POA: Insufficient documentation

## 2023-01-06 DIAGNOSIS — K644 Residual hemorrhoidal skin tags: Secondary | ICD-10-CM

## 2023-01-06 DIAGNOSIS — F17290 Nicotine dependence, other tobacco product, uncomplicated: Secondary | ICD-10-CM | POA: Diagnosis not present

## 2023-01-06 DIAGNOSIS — D125 Benign neoplasm of sigmoid colon: Secondary | ICD-10-CM

## 2023-01-06 DIAGNOSIS — I10 Essential (primary) hypertension: Secondary | ICD-10-CM | POA: Diagnosis not present

## 2023-01-06 DIAGNOSIS — K635 Polyp of colon: Secondary | ICD-10-CM

## 2023-01-06 DIAGNOSIS — Z800 Family history of malignant neoplasm of digestive organs: Secondary | ICD-10-CM | POA: Diagnosis not present

## 2023-01-06 DIAGNOSIS — K648 Other hemorrhoids: Secondary | ICD-10-CM

## 2023-01-06 HISTORY — PX: COLONOSCOPY WITH PROPOFOL: SHX5780

## 2023-01-06 HISTORY — PX: POLYPECTOMY: SHX5525

## 2023-01-06 LAB — HM COLONOSCOPY

## 2023-01-06 SURGERY — COLONOSCOPY WITH PROPOFOL
Anesthesia: General

## 2023-01-06 MED ORDER — PROPOFOL 10 MG/ML IV BOLUS
INTRAVENOUS | Status: DC | PRN
  Administered 2023-01-06: 100 mg via INTRAVENOUS

## 2023-01-06 MED ORDER — PHENYLEPHRINE 80 MCG/ML (10ML) SYRINGE FOR IV PUSH (FOR BLOOD PRESSURE SUPPORT)
PREFILLED_SYRINGE | INTRAVENOUS | Status: AC
  Filled 2023-01-06: qty 10

## 2023-01-06 MED ORDER — PROPOFOL 500 MG/50ML IV EMUL
INTRAVENOUS | Status: AC
  Filled 2023-01-06: qty 50

## 2023-01-06 MED ORDER — LACTATED RINGERS IV SOLN
INTRAVENOUS | Status: DC | PRN
Start: 2023-01-06 — End: 2023-01-06

## 2023-01-06 MED ORDER — LIDOCAINE HCL (CARDIAC) PF 100 MG/5ML IV SOSY
PREFILLED_SYRINGE | INTRAVENOUS | Status: DC | PRN
  Administered 2023-01-06: 80 mg via INTRAVENOUS

## 2023-01-06 MED ORDER — PROPOFOL 500 MG/50ML IV EMUL
INTRAVENOUS | Status: DC | PRN
  Administered 2023-01-06: 150 ug/kg/min via INTRAVENOUS

## 2023-01-06 MED ORDER — EPHEDRINE SULFATE-NACL 50-0.9 MG/10ML-% IV SOSY
PREFILLED_SYRINGE | INTRAVENOUS | Status: DC | PRN
  Administered 2023-01-06: 10 mg via INTRAVENOUS

## 2023-01-06 MED ORDER — PHENYLEPHRINE 80 MCG/ML (10ML) SYRINGE FOR IV PUSH (FOR BLOOD PRESSURE SUPPORT)
PREFILLED_SYRINGE | INTRAVENOUS | Status: DC | PRN
  Administered 2023-01-06 (×3): 160 ug via INTRAVENOUS

## 2023-01-06 NOTE — Transfer of Care (Addendum)
Immediate Anesthesia Transfer of Care Note  Patient: Todd Randall  Procedure(s) Performed: COLONOSCOPY WITH PROPOFOL POLYPECTOMY  Patient Location: PACU and Endoscopy Unit  Anesthesia Type:General  Level of Consciousness: drowsy and patient cooperative  Airway & Oxygen Therapy: Patient Spontanous Breathing and Patient connected to nasal cannula oxygen  Post-op Assessment: Report given to RN and Post -op Vital signs reviewed and stable  Post vital signs: Reviewed and stable  Last Vitals:  Vitals Value Taken Time  BP 125/67 01/06/23   1025  Temp 36.4 01/06/23   1025  Pulse 76 01/06/23   1025  Resp    SpO2 96% 01/06/23   1025    Last Pain:  Vitals:   01/06/23 1025  TempSrc: Oral  PainSc:       Patients Stated Pain Goal: 7 (01/06/23 0909)  Complications: No notable events documented.

## 2023-01-06 NOTE — Anesthesia Preprocedure Evaluation (Signed)
Anesthesia Evaluation  Patient identified by MRN, date of birth, ID band Patient awake    Reviewed: Allergy & Precautions, H&P , NPO status , Patient's Chart, lab work & pertinent test results, reviewed documented beta blocker date and time   Airway Mallampati: II  TM Distance: >3 FB Neck ROM: full    Dental  (+) Edentulous Upper, Dental Advisory Given   Pulmonary COPD, former smoker   Pulmonary exam normal breath sounds clear to auscultation       Cardiovascular Exercise Tolerance: Good hypertension, + CAD  Normal cardiovascular exam Rhythm:Regular Rate:Normal     Neuro/Psych negative neurological ROS  negative psych ROS   GI/Hepatic negative GI ROS, Neg liver ROS,,,  Endo/Other  negative endocrine ROS    Renal/GU negative Renal ROS  negative genitourinary   Musculoskeletal   Abdominal   Peds  Hematology negative hematology ROS (+)   Anesthesia Other Findings   Reproductive/Obstetrics negative OB ROS                             Anesthesia Physical Anesthesia Plan  ASA: 3  Anesthesia Plan: General   Post-op Pain Management: Minimal or no pain anticipated   Induction: Intravenous  PONV Risk Score and Plan: Propofol infusion  Airway Management Planned: Nasal Cannula and Natural Airway  Additional Equipment: None  Intra-op Plan:   Post-operative Plan:   Informed Consent: I have reviewed the patients History and Physical, chart, labs and discussed the procedure including the risks, benefits and alternatives for the proposed anesthesia with the patient or authorized representative who has indicated his/her understanding and acceptance.     Dental Advisory Given  Plan Discussed with: CRNA  Anesthesia Plan Comments:        Anesthesia Quick Evaluation

## 2023-01-06 NOTE — Discharge Instructions (Signed)

## 2023-01-06 NOTE — H&P (Signed)
Primary Care Physician:  Kerri Perches, MD Primary Gastroenterologist:  Dr. Tasia Catchings  Pre-Procedure History & Physical:  HPI:  Todd Randall is a 67 y.o. male is here for a colonoscopy for surveillance of colon polyps also with family history of CRC  .  No melena or hematochezia.  No abdominal pain or unintentional weight loss.  No change in bowel habits.  Overall feels well from a GI standpoint.  2019 with 2 TA by Dr Karilyn Cota  Patient had tubular adenoma removed in August 2007 and no polyp was found on his exam August 2014. Family history is positive for CRC in mother who was 22 at the time of diagnosis and died a year later with metastatic disease.   Past Medical History:  Diagnosis Date   COPD (chronic obstructive pulmonary disease) (HCC)    Hand pain, right 10/06/2018   Hyperlipidemia    Hypertension     Past Surgical History:  Procedure Laterality Date   COLONOSCOPY N/A 11/10/2012   Procedure: COLONOSCOPY;  Surgeon: Malissa Hippo, MD;  Location: [facility];  Service: Endoscopy;  Laterality: N/A;  205-moved to 120 Ann to notify pt   COLONOSCOPY N/A 10/29/2017   Procedure: COLONOSCOPY;  Surgeon: Malissa Hippo, MD;  Location: [facility];  Service: Endoscopy;  Laterality: N/A;  1200   POLYPECTOMY  10/29/2017   Procedure: POLYPECTOMY;  Surgeon: Malissa Hippo, MD;  Location: [facility];  Service: Endoscopy;;  colon   TOTAL HIP ARTHROPLASTY     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    Prior to Admission medications   Medication Sig Start Date End Date Taking? Authorizing Provider  albuterol (VENTOLIN HFA) 108 (90 Base) MCG/ACT inhaler Inhale 2 puffs into the lungs every 4 (four) hours as needed for wheezing or shortness of breath. 12/16/20   Eber Hong, MD  allopurinol (ZYLOPRIM) 300 MG tablet TAKE 1 TABLET BY MOUTH DAILY 03/03/22   Kerri Perches, MD  amLODipine (NORVASC) 10 MG tablet TAKE 1 TABLET BY MOUTH DAILY 03/03/22   Kerri Perches, MD   cholecalciferol (VITAMIN D) 1000 units tablet Take 1,000 Units by mouth daily.    [provider]  cloNIDine (CATAPRES) 0.1 MG tablet TAKE 1 TABLET BY MOUTH AT  BEDTIME 09/16/22   Kerri Perches, MD  Coenzyme Q10 (COQ10) 100 MG CAPS Take 100 mg by mouth daily.    [provider]  Cyanocobalamin (B-12) 2500 MCG TABS Take 2,500 mcg by mouth daily.    [provider]  fenofibrate (TRICOR) 48 MG tablet TAKE 1 TABLET BY MOUTH DAILY 01/05/23   Kerri Perches, MD  fish oil-omega-3 fatty acids 1000 MG capsule Take 2 g by mouth daily.     [provider]  fluticasone-salmeterol (ADVAIR) 250-50 MCG/ACT AEPB USE 1 INHALATION BY MOUTH TWICE  DAILY 09/16/22   Kerri Perches, MD  Magnesium 400 MG TABS Take 400 mg by mouth daily.    [provider]  Multiple Vitamin (MULTIVITAMIN) tablet Take 1 tablet by mouth daily.    [provider]  naproxen (NAPROSYN) 500 MG tablet Take 1 tablet (500 mg total) by mouth 2 (two) times daily. 08/25/22   Vickki Hearing, MD  pravastatin (PRAVACHOL) 10 MG tablet TAKE 1 TABLET BY MOUTH DAILY 09/16/22   Kerri Perches, MD  spironolactone (ALDACTONE) 25 MG tablet TAKE 1 TABLET BY MOUTH DAILY 09/16/22   Kerri Perches, MD  zinc gluconate 50 MG tablet  Take 50 mg by mouth daily.    [provider]    Allergies as of 12/11/2022 - Review Complete 08/08/2022  Allergen Reaction Noted   Lisinopril Cough 09/21/2012    Family History  Problem Relation Age of Onset   Colon cancer Mother     Social History   Socioeconomic History   Marital status: Married    Spouse name: Not on file   Number of children: Not on file   Years of education: Not on file   Highest education level: Bachelor's degree (e.g., BA, AB, BS)  Occupational History   Not on file  Tobacco Use   Smoking status: Former    Types: E-cigarettes    Quit date: 06/05/2022    Years since quitting: 0.5   Smokeless tobacco: Never    Tobacco comments:    quit 6 yrs ago. He is using the E-cigarettes  Vaping Use   Vaping status: Every Day  Substance and Sexual Activity   Alcohol use: No    Alcohol/week: 0.0 standard drinks of alcohol   Drug use: No   Sexual activity: Yes  Other Topics Concern   Not on file  Social History Narrative   Not on file   Social Determinants of Health   Financial Resource Strain: Low Risk  (07/08/2022)   Overall Financial Resource Strain (CARDIA)    Difficulty of Paying Living Expenses: Not hard at all  Food Insecurity: No Food Insecurity (07/08/2022)   Hunger Vital Sign    Worried About Running Out of Food in the Last Year: Never true    Ran Out of Food in the Last Year: Never true  Transportation Needs: No Transportation Needs (07/08/2022)   PRAPARE - Administrator, Civil Service (Medical): No    Lack of Transportation (Non-Medical): No  Physical Activity: Unknown (07/08/2022)   Exercise Vital Sign    Days of Exercise per Week: Patient declined    Minutes of Exercise per Session: Not on file  Stress: No Stress Concern Present (07/08/2022)   [employer] of Occupational Health - Occupational Stress Questionnaire    Feeling of Stress : Not at all  Social Connections: Unknown (07/24/2022)   Received from [facility], [facility]   Social Network    Social Network: Not on file  Intimate Partner Violence: Unknown (07/24/2022)   Received from [facility], [facility]   HITS    Physically Hurt: Not on file    Insult or Talk Down To: Not on file    Threaten Physical Harm: Not on file    Scream or Curse: Not on file    Review of Systems: See HPI, otherwise negative ROS  Physical Exam: Vital signs in last 24 hours:     General:   Alert,  Well-developed, well-nourished, pleasant and cooperative in NAD Head:  Normocephalic and atraumatic. Eyes:  Sclera clear, no icterus.   Conjunctiva pink. Ears:  Normal auditory acuity. Nose:  No deformity, discharge,  or  lesions. Msk:  Symmetrical without gross deformities. Normal posture. Extremities:  Without clubbing or edema. Neurologic:  Alert and  oriented x4;  grossly normal neurologically. Skin:  Intact without significant lesions or rashes. Psych:  Alert and cooperative. Normal mood and affect.  Impression/Plan: Todd Randall is a 67 y.o. male is here for a colonoscopy for surveillance of colon polyps also with family history of CRC   The risks of the procedure including infection, bleed, or perforation as well as benefits, limitations,  alternatives and imponderables have been reviewed with the patient. Questions have been answered. All parties agreeable.

## 2023-01-06 NOTE — Anesthesia Postprocedure Evaluation (Signed)
Anesthesia Post Note  Patient: Todd Randall  Procedure(s) Performed: COLONOSCOPY WITH PROPOFOL POLYPECTOMY  Patient location during evaluation: PACU Anesthesia Type: General Level of consciousness: awake and alert Pain management: pain level controlled Vital Signs Assessment: post-procedure vital signs reviewed and stable Respiratory status: spontaneous breathing, nonlabored ventilation, respiratory function stable and patient connected to nasal cannula oxygen Cardiovascular status: blood pressure returned to baseline and stable Postop Assessment: no apparent nausea or vomiting Anesthetic complications: no   There were no known notable events for this encounter.   Last Vitals:  Vitals:   01/06/23 0909 01/06/23 1025  BP: 128/74 125/67  Pulse: 83 75  Resp: 15   Temp: 36.5 C 36.4 C  SpO2: 97% 96%    Last Pain:  Vitals:   01/06/23 1025  TempSrc: Oral  PainSc:                  Gaetano Hawthorne

## 2023-01-06 NOTE — Op Note (Signed)
[facility] Patient Name: Todd Randall Procedure Date: 01/06/2023 9:42 AM MRN: [id number] Date of Birth: 12-27-1955 Attending MD: Sanjuan Dame , MD, [id number] CSN: [id number] Age: 67 Admit Type: Outpatient Procedure:                Colonoscopy Indications:              High risk colon cancer surveillance: Personal                            history of colonic polyps, Family history of colon                            cancer in a first-degree relative before age 19                            years Providers:                Sanjuan Dame, MD, Angelica Ran, Elinor Parkinson Referring MD:              Medicines:                Monitored Anesthesia Care Complications:            No immediate complications. Estimated Blood Loss:     Estimated blood loss: none. Procedure:                Pre-Anesthesia Assessment:                           - Prior to the procedure, a History and Physical                            was performed, and patient medications and                            allergies were reviewed. The patient's tolerance of                            previous anesthesia was also reviewed. The risks                            and benefits of the procedure and the sedation                            options and risks were discussed with the patient.                            All questions were answered, and informed consent                            was obtained. Prior Anticoagulants: The patient has                            taken no anticoagulant or antiplatelet agents  except for aspirin. ASA Grade Assessment: II - A                            patient with mild systemic disease. After reviewing                            the risks and benefits, the patient was deemed in                            satisfactory condition to undergo the procedure.                           After obtaining informed consent, the colonoscope                             was passed under direct vision. Throughout the                            procedure, the patient's blood pressure, pulse, and                            oxygen saturations were monitored continuously. The                            [id number]) scope was introduced through the                            anus and advanced to the the terminal ileum. The                            colonoscopy was performed without difficulty. The                            patient tolerated the procedure well. The quality                            of the bowel preparation was evaluated using the                            BBPS [facility] Bowel Preparation Scale) with scores                            of: Right Colon = 2 (minor amount of residual                            staining, small fragments of stool and/or opaque                            liquid, but mucosa seen well), Transverse Colon = 2                            (minor amount of residual staining, small fragments  of stool and/or opaque liquid, but mucosa seen                            well) and Left Colon = 2 (minor amount of residual                            staining, small fragments of stool and/or opaque                            liquid, but mucosa seen well). The total BBPS score                            equals 6. The terminal ileum, ileocecal valve,                            appendiceal orifice, and rectum were photographed. Scope In: 9:52:40 AM Scope Out: 10:23:31 AM Scope Withdrawal Time: 0 hours 28 minutes 58 seconds  Total Procedure Duration: 0 hours 30 minutes 51 seconds  Findings:      The perianal and digital rectal examinations were normal.      Two sessile polyps were found in the sigmoid colon and ascending colon.       The polyps were 3 to 4 mm in size. These polyps were removed with a cold       snare. Resection and retrieval were complete.      The terminal ileum appeared normal.       Non-bleeding external and internal hemorrhoids were found during       retroflexion. Impression:               - Two 3 to 4 mm polyps in the sigmoid colon and in                            the ascending colon, removed with a cold snare.                            Resected and retrieved.                           - The examined portion of the ileum was normal.                           - Non-bleeding external and internal hemorrhoids. Moderate Sedation:      Per Anesthesia Care Recommendation:           - Patient has a contact number available for                            emergencies. The signs and symptoms of potential                            delayed complications were discussed with the                            patient. Return to normal activities tomorrow.  Written discharge instructions were provided to the                            patient.                           - Resume previous diet.                           - Continue present medications.                           - Await pathology results.                           - Repeat colonoscopy in 5 years for surveillance.                           - Return to primary care physician as previously                            scheduled. Procedure Code(s):        --- Professional ---                           [phone], Colonoscopy, flexible; with removal of                            tumor(s), polyp(s), or other lesion(s) by snare                            technique Diagnosis Code(s):        --- Professional ---                           Z86.010, Personal history of colonic polyps                           D12.5, Benign neoplasm of sigmoid colon                           D12.2, Benign neoplasm of ascending colon                           K64.8, Other hemorrhoids                           Z80.0, Family history of malignant neoplasm of                            digestive organs CPT copyright 2022  American Medical Association. All rights reserved. The codes documented in this report are preliminary and upon coder review may  be revised to meet current compliance requirements. Sanjuan Dame, MD Sanjuan Dame, MD 01/06/2023 10:30:59 AM This report has been signed electronically. Number of Addenda: 0

## 2023-01-07 ENCOUNTER — Encounter (INDEPENDENT_AMBULATORY_CARE_PROVIDER_SITE_OTHER): Payer: Self-pay | Admitting: *Deleted

## 2023-01-07 LAB — SURGICAL PATHOLOGY

## 2023-01-08 NOTE — Progress Notes (Signed)
I reviewed the pathology results. Ann, can you send her a letter with the findings as described below please? Repeat colonoscopy in 5 years  Thanks,  Vista Lawman, MD Gastroenterology and Hepatology [facility]  ---------------------------------------------------------------------------------------------  [facility] [address], [place], [place] [address] Phone:  [phone]   01/08/23 Todd Randall, [place]   Dear Todd Randall,  I am writing to inform you that the biopsies taken during your recent endoscopic examination showed:   I am writing to let you know the results of your recent colonoscopy.  You had a total of 2 polyps removed. The pathology came back as "hyperplastic polyp." These findings are NOT cancer and do not turn into cancer.  Given these findings, it is recommended that your next colonoscopy be performed in 5 years given family history   Please call us at [phone] if you have persistent problems or have questions about your condition that have not been fully answered at this time.  Sincerely,  Vista Lawman, MD Gastroenterology and Hepatology

## 2023-01-09 ENCOUNTER — Encounter (INDEPENDENT_AMBULATORY_CARE_PROVIDER_SITE_OTHER): Payer: Self-pay | Admitting: *Deleted

## 2023-01-13 ENCOUNTER — Encounter (HOSPITAL_COMMUNITY): Payer: Self-pay | Admitting: Gastroenterology

## 2023-01-21 ENCOUNTER — Ambulatory Visit (INDEPENDENT_AMBULATORY_CARE_PROVIDER_SITE_OTHER): Payer: BC Managed Care – PPO | Admitting: Family Medicine

## 2023-01-21 ENCOUNTER — Encounter: Payer: Self-pay | Admitting: Family Medicine

## 2023-01-21 VITALS — BP 126/78 | HR 90 | Ht 66.0 in | Wt 216.0 lb

## 2023-01-21 DIAGNOSIS — Z23 Encounter for immunization: Secondary | ICD-10-CM | POA: Diagnosis not present

## 2023-01-21 DIAGNOSIS — Z0000 Encounter for general adult medical examination without abnormal findings: Secondary | ICD-10-CM

## 2023-01-21 DIAGNOSIS — I10 Essential (primary) hypertension: Secondary | ICD-10-CM | POA: Diagnosis not present

## 2023-01-21 DIAGNOSIS — R972 Elevated prostate specific antigen [PSA]: Secondary | ICD-10-CM

## 2023-01-21 NOTE — Patient Instructions (Addendum)
F/u in 6 months, call if you need me sooner   Labs needed all  those ordered  06/2023, and pls add on cBC and PSA to be drawn week of 01/26/2023  Flu and pneumonia 20 vaccines today  Work on exercise commitment and increased "Clean , green " eating please  Weight loss goal of 8 to 10 pounds  Best for Season and 2025!  Thanks for choosing [facility], we consider it a privelige to serve you.

## 2023-01-21 NOTE — Assessment & Plan Note (Signed)
After obtaining informed consent, the vaccine is  administered , with no adverse effect noted at the time of administration.  

## 2023-01-21 NOTE — Assessment & Plan Note (Signed)
Pneumonia 20 and flu vaccines at visit

## 2023-01-21 NOTE — Progress Notes (Signed)
   Todd Randall     MRN: [id number]      DOB: 1956/03/15  Chief Complaint  Patient presents with   Annual Exam    CPE     HPI: Patient is in for annual physical exam. . Immunization is reviewed , and  updated.    PE; BP 126/78 (BP Location: Right Arm, Patient Position: Sitting, Cuff Size: Large)   Pulse 90   Ht 5\' 6"  (1.676 m)   Wt 216 lb 0.6 oz (98 kg)   SpO2 94%   BMI 34.87 kg/m   Pleasant male, alert and oriented x 3, in no cardio-pulmonary distress. Afebrile. HEENT No facial trauma or asymetry. Sinuses non tender. EOMI External ears normal,  Neck: supple, no adenopathy,JVD or thyromegaly.No bruits.  Chest: Clear to ascultation bilaterally.No crackles or wheezes. Non tender to palpation  Cardiovascular system; Heart sounds normal,  S1 and  S2 ,no S3.  No murmur, or thrill. Apical beat not displaced Peripheral pulses normal.  Abdomen: Soft, non tender, no organomegaly or masses. No bruits. Bowel sounds normal. No guarding, tenderness or rebound.    Musculoskeletal exam: Full ROM of spine, hips , and knees.Reduced in right shoulder No deformity ,swelling or crepitus noted. No muscle wasting or atrophy.   Neurologic: Cranial nerves 2 to 12 intact. Power, tone ,sensation es normal throughout. No disturbance in gait. No tremor.  Skin: Intact, no ulceration, erythema , scaling or rash noted. Pigmentation normal throughout  Psych; Normal mood and affect. Judgement and concentration normal   Assessment & Plan:  Annual physical exam Annual exam as documented.  Changes in health habits are decided on by the patient with goals and time frames  set for achieving them. Immunization and cancer screening needs are specifically addressed at this visit.   Immunization due Pneumonia 20 and flu vaccines at visit

## 2023-01-21 NOTE — Assessment & Plan Note (Signed)
Annual exam as documented.  Changes in health habits are decided on by the patient with goals and time frames  set for achieving them. Immunization and cancer screening needs are specifically addressed at this visit.  

## 2023-01-28 ENCOUNTER — Encounter: Payer: Self-pay | Admitting: Family Medicine

## 2023-01-28 LAB — LIPID PANEL
Chol/HDL Ratio: 4.8 {ratio} (ref 0.0–5.0)
Cholesterol, Total: 183 mg/dL (ref 100–199)
HDL: 38 mg/dL — ABNORMAL LOW (ref >39)
LDL Chol Calc (NIH): 114 mg/dL — ABNORMAL HIGH (ref 0–99)
Triglycerides: 176 mg/dL — ABNORMAL HIGH (ref 0–149)
VLDL Cholesterol Cal: 31 mg/dL (ref 5–40)

## 2023-01-28 LAB — CBC
Hematocrit: 42.6 % (ref 37.5–51.0)
Hemoglobin: 13.5 g/dL (ref 13.0–17.7)
MCH: 27.7 pg (ref 26.6–33.0)
MCHC: 31.7 g/dL (ref 31.5–35.7)
MCV: 87 fL (ref 79–97)
Platelets: 278 10*3/uL (ref 150–450)
RBC: 4.88 x10E6/uL (ref 4.14–5.80)
RDW: 14.1 % (ref 11.6–15.4)
WBC: 4.6 10*3/uL (ref 3.4–10.8)

## 2023-01-28 LAB — CMP14+EGFR
ALT: 16 [IU]/L (ref 0–44)
AST: 25 [IU]/L (ref 0–40)
Albumin: 5 g/dL — ABNORMAL HIGH (ref 3.9–4.9)
Alkaline Phosphatase: 73 [IU]/L (ref 44–121)
BUN/Creatinine Ratio: 13 (ref 10–24)
BUN: 13 mg/dL (ref 8–27)
Bilirubin Total: 0.4 mg/dL (ref 0.0–1.2)
CO2: 24 mmol/L (ref 20–29)
Calcium: 9.8 mg/dL (ref 8.6–10.2)
Chloride: 100 mmol/L (ref 96–106)
Creatinine, Ser: 1 mg/dL (ref 0.76–1.27)
Globulin, Total: 2.7 g/dL (ref 1.5–4.5)
Glucose: 80 mg/dL (ref 70–99)
Potassium: 4.7 mmol/L (ref 3.5–5.2)
Sodium: 139 mmol/L (ref 134–144)
Total Protein: 7.7 g/dL (ref 6.0–8.5)
eGFR: 84 mL/min/{1.73_m2} (ref >59)

## 2023-01-28 LAB — URIC ACID: Uric Acid: 5 mg/dL (ref 3.8–8.4)

## 2023-01-28 LAB — PSA: Prostate Specific Ag, Serum: 6.6 ng/mL — ABNORMAL HIGH (ref 0.0–4.0)

## 2023-01-28 LAB — TSH: TSH: 0.64 u[IU]/mL (ref 0.450–4.500)

## 2023-01-28 NOTE — Assessment & Plan Note (Signed)
PSA has again increased, urology referral made for ongoing evaluation

## 2023-01-28 NOTE — Addendum Note (Signed)
Addended by: Kerri Perches on: 01/28/2023 07:51 AM   Modules accepted: Orders

## 2023-03-05 ENCOUNTER — Ambulatory Visit: Payer: BC Managed Care – PPO | Admitting: Urology

## 2023-03-05 VITALS — BP 154/84 | HR 112

## 2023-03-05 DIAGNOSIS — N401 Enlarged prostate with lower urinary tract symptoms: Secondary | ICD-10-CM | POA: Diagnosis not present

## 2023-03-05 DIAGNOSIS — R3912 Poor urinary stream: Secondary | ICD-10-CM

## 2023-03-05 DIAGNOSIS — R972 Elevated prostate specific antigen [PSA]: Secondary | ICD-10-CM

## 2023-03-05 DIAGNOSIS — N138 Other obstructive and reflux uropathy: Secondary | ICD-10-CM

## 2023-03-05 DIAGNOSIS — N3941 Urge incontinence: Secondary | ICD-10-CM

## 2023-03-05 LAB — URINALYSIS, ROUTINE W REFLEX MICROSCOPIC
Bilirubin, UA: NEGATIVE
Glucose, UA: NEGATIVE
Ketones, UA: NEGATIVE
Leukocytes,UA: NEGATIVE
Nitrite, UA: NEGATIVE
Specific Gravity, UA: 1.025 (ref 1.005–1.030)
Urobilinogen, Ur: 0.2 mg/dL (ref 0.2–1.0)
pH, UA: 6 (ref 5.0–7.5)

## 2023-03-05 LAB — MICROSCOPIC EXAMINATION
Bacteria, UA: NONE SEEN
WBC, UA: NONE SEEN /[HPF] (ref 0–5)

## 2023-03-05 NOTE — Progress Notes (Signed)
Subjective:  1. Elevated PSA   2. BPH with urinary obstruction   3. Weak urinary stream   4. Urge incontinence     03/05/23: Todd Randall returns today in f/u.  He has a history of an elevated PSA with a negative standard biopsy in 2020 and a negative MR fusion biopsy in 3/23. Prostate volume is 58 ml.  His PSA on 01/27/23 was 6.6 which is in his usual range. His IPSS is 10 with a weak stream.  He had an episode of urgency with UUI yesterday which has never happened before.  He has had hematuria or dysuria.  He has no history of UTI's.   His UA today is clear and he is emptying well with a PVR of 53ml that was actually prior to a void. Marland Kitchen   GUHx:  6.25.2020: TRUS/Bx -- all cores negative. PSA 4.2 volume 37 ml, PSAD 0.11. 11.19.2020:PSA 6.5 4.2.2021: PSA 4.3.  10.26.2021: PCA 3 test drawn in early 2021.  It revealed a score of 32 (weakly positive). PSA 8.5 I ordered and MRI of his prostate.  This was never scheduled.  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 10.25.2022: PSA 10.1  In November, 2022 he underwent prostate MRI because of his elevated PSA trend.   Susceptibility artifact from right hip prosthesis limits visualization the right side of the prostate, greatest on diffusion imaging. -- Peripheral Zone: The right lateral peripheral zone is obscured by artifact from right hip prosthesis. No focal lesion seen on ADC or high b-value DWI sequences. -- Transition/Central Zone: Moderately enlarged with encapsulated BPH nodules noted; however, no suspicious nodules are identified on T2-weighted or diffusion sequences. -- Measurements/Volume:  4.6 by 4.4 x 5.5 cm (volume = 58 cm^3) Transcapsular spread:  Absent Seminal vesicle involvement:  Abse grade nt Neurovascular bundle involvement:  Absent Pelvic adenopathy: None visualized Bone metastasis: None visualized Other:  Sigmoid diverticulosis, without evidence of  diverticulitis. IMPRESSION: Image degradation due to artifact from right hip prosthesis. Otherwise, no radiographic evidence of high-grade prostate carcinoma. PI-RADS 1 (v2.1): Very Low (clinically significant cancer highly unlikely)     IPSS     Row Name 03/05/23 0900         International Prostate Symptom Score   How often have you had the sensation of not emptying your bladder? Less than half the time     How often have you had to urinate less than every two hours? Less than 1 in 5 times     How often have you found you stopped and started again several times when you urinated? Not at All     How often have you found it difficult to postpone urination? Less than half the time     How often have you had a weak urinary stream? Almost always     How often have you had to strain to start urination? Not at All     How many times did you typically get up at night to urinate? None     Total IPSS Score 10       Quality of Life due to urinary symptoms   If you were to spend the rest of your life with your urinary condition just the way it is now how would you feel about that? Mostly Satisfied               ROS:  ROS:  A complete review of systems was  performed.  All systems are negative except for pertinent findings as noted.   Review of Systems  All other systems reviewed and are negative.   Allergies  Allergen Reactions   Lisinopril Cough    Outpatient Encounter Medications as of 03/05/2023  Medication Sig Note   albuterol (VENTOLIN HFA) 108 (90 Base) MCG/ACT inhaler Inhale 2 puffs into the lungs every 4 (four) hours as needed for wheezing or shortness of breath.    allopurinol (ZYLOPRIM) 300 MG tablet TAKE 1 TABLET BY MOUTH DAILY    amLODipine (NORVASC) 10 MG tablet TAKE 1 TABLET BY MOUTH DAILY    aspirin EC 81 MG tablet Take 81 mg by mouth daily. Swallow whole.    cholecalciferol (VITAMIN D) 1000 units tablet Take 1,000 Units by mouth daily.    cloNIDine  (CATAPRES) 0.1 MG tablet TAKE 1 TABLET BY MOUTH AT  BEDTIME    Coenzyme Q10 (COQ10) 100 MG CAPS Take 100 mg by mouth daily.    Cyanocobalamin (B-12) 2500 MCG TABS Take 2,500 mcg by mouth daily.    fenofibrate (TRICOR) 48 MG tablet TAKE 1 TABLET BY MOUTH DAILY    fish oil-omega-3 fatty acids 1000 MG capsule Take 2 g by mouth daily.     fluticasone-salmeterol (ADVAIR) 250-50 MCG/ACT AEPB USE 1 INHALATION BY MOUTH TWICE  DAILY    Magnesium 400 MG TABS Take 400 mg by mouth daily.    Multiple Vitamin (MULTIVITAMIN) tablet Take 1 tablet by mouth daily. 12/16/2020: Do not discontinue   pravastatin (PRAVACHOL) 10 MG tablet TAKE 1 TABLET BY MOUTH DAILY    spironolactone (ALDACTONE) 25 MG tablet TAKE 1 TABLET BY MOUTH DAILY    zinc gluconate 50 MG tablet Take 50 mg by mouth daily.    [DISCONTINUED] meloxicam (MOBIC) 15 MG tablet Take 15 mg by mouth daily.    [DISCONTINUED] naproxen (NAPROSYN) 500 MG tablet Take 1 tablet (500 mg total) by mouth 2 (two) times daily. 01/06/2023: Patient not taking.   No facility-administered encounter medications on file as of 03/05/2023.    Past Medical History:  Diagnosis Date   COPD (chronic obstructive pulmonary disease) (HCC)    Hand pain, right 10/06/2018   Hyperlipidemia    Hypertension     Past Surgical History:  Procedure Laterality Date   COLONOSCOPY N/A 11/10/2012   Procedure: COLONOSCOPY;  Surgeon: Malissa Hippo, MD;  [facility];  Service: Endoscopy;  Laterality: N/A;  205-moved to 120 Ann to notify pt   COLONOSCOPY N/A 10/29/2017   Procedure: COLONOSCOPY;  Surgeon: Malissa Hippo, MD;  Location: [facility];  Service: Endoscopy;  Laterality: N/A;  1200   COLONOSCOPY WITH PROPOFOL N/A 01/06/2023   Procedure: COLONOSCOPY WITH PROPOFOL;  Surgeon: Franky Macho, MD;  Location: [facility];  Service: Endoscopy;  Laterality: N/A;  1030AM, ASA 2   POLYPECTOMY  10/29/2017   Procedure: POLYPECTOMY;  Surgeon: Malissa Hippo, MD;   Location: [facility];  Service: Endoscopy;;  colon   POLYPECTOMY  01/06/2023   Procedure: POLYPECTOMY;  Surgeon: Franky Macho, MD;  Location: [facility];  Service: Endoscopy;;   ROTATOR CUFF REPAIR Right 09/2022   TOTAL HIP ARTHROPLASTY     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    Social History   Socioeconomic History   Marital status: Married    Spouse name: Not on file   Number of children: Not on file   Years of education: Not on file  Highest education level: Bachelor's degree (e.g., BA, AB, BS)  Occupational History   Not on file  Tobacco Use   Smoking status: Former    Types: E-cigarettes    Quit date: 06/05/2022    Years since quitting: 0.7   Smokeless tobacco: Never   Tobacco comments:    quit 6 yrs ago. He is using the E-cigarettes  Vaping Use   Vaping status: Every Day  Substance and Sexual Activity   Alcohol use: No    Alcohol/week: 0.0 standard drinks of alcohol   Drug use: No   Sexual activity: Yes  Other Topics Concern   Not on file  Social History Narrative   Not on file   Social Drivers of Health   Financial Resource Strain: Low Risk  (07/08/2022)   Overall Financial Resource Strain (CARDIA)    Difficulty of Paying Living Expenses: Not hard at all  Food Insecurity: No Food Insecurity (07/08/2022)   Hunger Vital Sign    Worried About Running Out of Food in the Last Year: Never true    Ran Out of Food in the Last Year: Never true  Transportation Needs: No Transportation Needs (07/08/2022)   PRAPARE - Administrator, Civil Service (Medical): No    Lack of Transportation (Non-Medical): No  Physical Activity: Unknown (07/08/2022)   Exercise Vital Sign    Days of Exercise per Week: Patient declined    Minutes of Exercise per Session: Not on file  Stress: No Stress Concern Present (07/08/2022)   [employer] of Occupational Health - Occupational Stress Questionnaire    Feeling of Stress : Not at all  Social Connections:  Unknown (07/24/2022)   Received from [facility]   Social Network    Social Network: Not on file  Intimate Partner Violence: Unknown (07/24/2022)   Received from [facility], [facility]   HITS    Physically Hurt: Not on file    Insult or Talk Down To: Not on file    Threaten Physical Harm: Not on file    Scream or Curse: Not on file    Family History  Problem Relation Age of Onset   Colon cancer Mother        Objective: Vitals:   03/05/23 0903 03/05/23 0926  BP: (!) 174/92 (!) 154/84  Pulse: (!) 112      Physical Exam Vitals reviewed.  Constitutional:      Appearance: Normal appearance.  Genitourinary:    Comments: AP without lesions. NST without mass. Prostate 2.5+ benign. SV non-palpable Neurological:     Mental Status: He is alert.     Lab Results:  PSA PSA  Date Value Ref Range Status  06/17/2019 4.3 (H) < OR = 4.0 ng/mL Final    Comment:    The total PSA value from this assay system is  standardized against the WHO standard. The test  result will be approximately 20% lower when compared  to the equimolar-standardized total PSA (Beckman  Coulter). Comparison of serial PSA results should be  interpreted with this fact in mind. . This test was performed using the Siemens  chemiluminescent method. Values obtained from  different assay methods cannot be used interchangeably. PSA levels, regardless of value, should not be interpreted as absolute evidence of the presence or absence of disease.   02/03/2019 6.5 (H) < OR = 4.0 ng/mL Final    Comment:    The total PSA value from this assay system is  standardized against  the WHO standard. The test  result will be approximately 20% lower when compared  to the equimolar-standardized total PSA (Beckman  Coulter). Comparison of serial PSA results should be  interpreted with this fact in mind. . This test was performed using the Siemens  chemiluminescent method. Values obtained from   different assay methods cannot be used interchangeably. PSA levels, regardless of value, should not be interpreted as absolute evidence of the presence or absence of disease.   05/27/2017 4.2 (H) < OR = 4.0 ng/mL Final    Comment:    The total PSA value from this assay system is  standardized against the WHO standard. The test  result will be approximately 20% lower when compared  to the equimolar-standardized total PSA (Beckman  Coulter). Comparison of serial PSA results should be  interpreted with this fact in mind. . This test was performed using the Siemens  chemiluminescent method. Values obtained from  different assay methods cannot be used interchangeably. PSA levels, regardless of value, should not be interpreted as absolute evidence of the presence or absence of disease.    No results found for: "TESTOSTERONE" Lab Results  Component Value Date   PSA1 6.6 (H) 01/27/2023   PSA1 5.7 (H) 01/22/2022   PSA1 10.1 (H) 01/01/2021      Studies/Results: No results found. No results found for this or any previous visit.  No results found for this or any previous visit.  No results found for this or any previous visit.  No results found for this or any previous visit.  No results found for this or any previous visit.  No results found for this or any previous visit.  No results found for this or any previous visit.  No results found for this or any previous visit.    Assessment & Plan: Elevated PSA.  His PSA has been variable and is below the prior high.  He just needs to continue annual f/u with PSA and exam.  BPH with BOO.  He has primarily a reduced stream but had some urgency with UUI yesterday.  UA is clear and he is emptying well.  He is content with his symptoms.    No orders of the defined types were placed in this encounter.    Orders Placed This Encounter  Procedures   Microscopic Examination   Urinalysis, Routine w reflex microscopic   PSA,  total and free    Standing Status:   Future    Expected Date:   02/26/2024    Expiration Date:   03/04/2024   BLADDER SCAN AMB NON-IMAGING      Return in about 1 year (around 03/04/2024) for with PSA.   CC: Kerri Perches, MD      Bjorn Pippin 03/06/2023

## 2023-03-05 NOTE — Progress Notes (Signed)
PVR 53ml

## 2023-03-30 NOTE — Progress Notes (Signed)
 Attempted to reach patient regarding follow-up LDCT. Unable to reach patient directly. Detailed VM left asking that the patient return my call.

## 2023-04-20 NOTE — Progress Notes (Signed)
 Attempted to reach patient regarding follow-up LDCT. Unable to reach patient directly. Detailed VM left asking that the patient return my call.

## 2023-05-13 ENCOUNTER — Other Ambulatory Visit: Payer: Self-pay | Admitting: Family Medicine

## 2023-05-29 NOTE — Progress Notes (Signed)
 Patient is past due for LDCT. Called patient regarding follow-up. Patient states that he is not interested in follow-up at this time and that he would discuss with his PCP should he change his mind. Referral closed per patient request.

## 2023-07-22 ENCOUNTER — Ambulatory Visit: Payer: BC Managed Care – PPO | Admitting: Family Medicine

## 2023-07-22 ENCOUNTER — Other Ambulatory Visit: Payer: Self-pay

## 2023-07-22 ENCOUNTER — Encounter: Payer: Self-pay | Admitting: Family Medicine

## 2023-07-22 VITALS — BP 140/76 | HR 102 | Resp 18 | Ht 66.0 in | Wt 223.0 lb

## 2023-07-22 DIAGNOSIS — E790 Hyperuricemia without signs of inflammatory arthritis and tophaceous disease: Secondary | ICD-10-CM

## 2023-07-22 DIAGNOSIS — M25511 Pain in right shoulder: Secondary | ICD-10-CM

## 2023-07-22 DIAGNOSIS — E785 Hyperlipidemia, unspecified: Secondary | ICD-10-CM

## 2023-07-22 DIAGNOSIS — I10 Essential (primary) hypertension: Secondary | ICD-10-CM

## 2023-07-22 NOTE — Assessment & Plan Note (Signed)
 No recent gout flare  Updated lab needed at/ before next visit.

## 2023-07-22 NOTE — Progress Notes (Signed)
 Todd Randall     MRN: [id number]      DOB: 08-07-1955  Chief Complaint  Patient presents with   Medical Management of Chronic Issues    6 month follow up     HPI Todd Randall is here for follow up and re-evaluation of chronic medical conditions, medication management and review of any available recent lab and radiology data.  Preventive health is updated, specifically  Cancer screening and Immunization.   Questions or concerns regarding consultations or procedures which the PT has had in the interim are  addressed. The PT denies any adverse reactions to current medications since the last visit.  Tno regular exercise, poor eating habits and weight gain are concerns, hesitant to commit to necesssary changes   ROS Denies recent fever or chills. Denies sinus pressure, nasal congestion, ear pain or sore throat. Denies chest congestion, productive cough or wheezing. Denies chest pains, palpitations and leg swelling Denies abdominal pain, nausea, vomiting,diarrhea or constipation.   Denies dysuria, frequency, hesitancy or incontinence. Denies joint pain, swelling and limitation in mobility. Denies headaches, seizures, numbness, or tingling. Denies depression, , uncontrolled anxiety or insomnia. Current strss is [name] he plays for x 5 yeRAS, SURPRISED AND A BIT DISappointed a the responsed Denies skin break down or rash.   PE  BP (!) 140/76   Pulse (!) 102   Resp 18   Ht 5\' 6"  (1.676 m)   Wt 223 lb 0.6 oz (101.2 kg)   SpO2 93%   BMI 36.00 kg/m   Patient alert and oriented and in no cardiopulmonary distress.  HEENT: No facial asymmetry, EOMI,     Neck supple .  Chest: Clear to auscultation bilaterally.  CVS: S1, S2 no murmurs, no S3.Regular rate.  ABD: Soft non tender.   Ext: No edema  MS: Adequate ROM spine,  decreased in shoulder, adequate in hips and knees.  Skin: Intact, no ulcerations or rash noted.  Psych: Good eye contact, normal affect.  Memory intact not anxious or depressed appearing.  CNS: CN 2-12 intact, power,  normal throughout.no focal deficits noted.   Assessment & Plan  Hyperlipidemia LDL goal <100 Hyperlipidemia:Low fat diet discussed and encouraged.   Lipid Panel  Lab Results  Component Value Date   CHOL 183 01/27/2023   HDL 38 (L) 01/27/2023   LDLCALC 114 (H) 01/27/2023   TRIG 176 (H) 01/27/2023   CHOLHDL 4.8 01/27/2023     Updated lab needed at/ before next visit.   Morbid obesity (HCC) Deteriorated , lifestyle change needed  Patient re-educated about  the importance of commitment to a  minimum of 150 minutes of exercise per week as able.  The importance of healthy food choices with portion control discussed, as well as eating regularly and within a 12 hour window most days. The need to choose "clean , green" food 50 to 75% of the time is discussed, as well as to make water  the primary drink and set a goal of 64 ounces water  daily.       07/22/2023    8:09 AM 01/21/2023    8:06 AM 01/06/2023    9:09 AM  Weight /BMI  Weight 223 lb 0.6 oz 216 lb 0.6 oz 220 lb  Height 5\' 6"  (1.676 m) 5\' 6"  (1.676 m) 5\' 6"  (1.676 m)  BMI 36 kg/m2 34.87 kg/m2 35.51 kg/m2      Essential hypertension Not at goal, will hold on med inc per pt determination  to control with lifestyle change DASH diet and commitment to daily physical activity for a minimum of 30 minutes discussed and encouraged, as a part of hypertension management. The importance of attaining a healthy weight is also discussed.     07/22/2023    8:29 AM 07/22/2023    8:27 AM 07/22/2023    8:09 AM 03/05/2023    9:26 AM 03/05/2023    9:03 AM 01/21/2023    8:06 AM 01/06/2023   10:25 AM  BP/Weight  Systolic BP 140 140 154 154 174 126 125  Diastolic BP 76 84 83 84 92 78 67  Wt. (Lbs)   223.04   216.04   BMI   36 kg/m2   34.87 kg/m2        Hyperuricemia No recent gout flare  Updated lab needed at/ before next visit.   Right shoulder  pain Persists , but not as severe or debilitating

## 2023-07-22 NOTE — Assessment & Plan Note (Signed)
 Hyperlipidemia:Low fat diet discussed and encouraged.   Lipid Panel  Lab Results  Component Value Date   CHOL 183 01/27/2023   HDL 38 (L) 01/27/2023   LDLCALC 114 (H) 01/27/2023   TRIG 176 (H) 01/27/2023   CHOLHDL 4.8 01/27/2023     Updated lab needed at/ before next visit.

## 2023-07-22 NOTE — Assessment & Plan Note (Signed)
 Deteriorated , lifestyle change needed  Patient re-educated about  the importance of commitment to a  minimum of 150 minutes of exercise per week as able.  The importance of healthy food choices with portion control discussed, as well as eating regularly and within a 12 hour window most days. The need to choose "clean , green" food 50 to 75% of the time is discussed, as well as to make water  the primary drink and set a goal of 64 ounces water  daily.       07/22/2023    8:09 AM 01/21/2023    8:06 AM 01/06/2023    9:09 AM  Weight /BMI  Weight 223 lb 0.6 oz 216 lb 0.6 oz 220 lb  Height 5\' 6"  (1.676 m) 5\' 6"  (1.676 m) 5\' 6"  (1.676 m)  BMI 36 kg/m2 34.87 kg/m2 35.51 kg/m2

## 2023-07-22 NOTE — Patient Instructions (Signed)
 Annual exam early October, call if you need me sooner.  No changes in medications.  Please work on lifestyle changes to improve eating habits, food choices, and exercise commitment as we discussed.  Fasting CBC lipid CMP and EGFR TSH to be drawn 3 to 7 days before next visit.   Thanks for choosing [facility], we consider it a privelige to serve you.

## 2023-07-22 NOTE — Assessment & Plan Note (Signed)
 Not at goal, will hold on med inc per pt determination to control with lifestyle change DASH diet and commitment to daily physical activity for a minimum of 30 minutes discussed and encouraged, as a part of hypertension management. The importance of attaining a healthy weight is also discussed.     07/22/2023    8:29 AM 07/22/2023    8:27 AM 07/22/2023    8:09 AM 03/05/2023    9:26 AM 03/05/2023    9:03 AM 01/21/2023    8:06 AM 01/06/2023   10:25 AM  BP/Weight  Systolic BP 140 140 154 154 174 126 125  Diastolic BP 76 84 83 84 92 78 67  Wt. (Lbs)   223.04   216.04   BMI   36 kg/m2   34.87 kg/m2

## 2023-08-10 NOTE — Assessment & Plan Note (Signed)
 Persists , but not as severe or debilitating

## 2023-09-15 ENCOUNTER — Telehealth: Payer: Self-pay | Admitting: Pharmacy Technician

## 2023-09-15 ENCOUNTER — Other Ambulatory Visit (HOSPITAL_COMMUNITY): Payer: Self-pay

## 2023-09-15 ENCOUNTER — Telehealth: Payer: Self-pay

## 2023-09-15 NOTE — Telephone Encounter (Signed)
 Copied from CRM [id number]. Topic: Clinical - Prescription Issue >> Sep 15, 2023  9:23 AM Treva T wrote: Reason for CRM: Patient calling states, per [facility], home delivery, medication reorder was cancelled for, fluticasone -salmeterol (ADVAIR) 250-50 MCG/ACT AEPB is needing a Prior Authorization prior to getting refilled.  Per patient states, is nearing almost out of medication. Pr email patient email received, the process for PA needs to be started by PCP.   Preferred pharmacy:  [facility] - [place], [place] - [address] [facility] [address] [place] [address] [place] [address] Phone: [phone] Fax: [phone]  Patient requests  follow up once this is completed, and can be reached with any additional questions at [phone].

## 2023-09-15 NOTE — Telephone Encounter (Signed)
 Pharmacy Patient Advocate Encounter  Received notification from [facility] that Prior Authorization for Fluticasone -Salmeterol 250-50MCG/ACT aerosol powder has been APPROVED from 09/15/2023 to 09/14/2024. Ran test claim, Copay is $5.00. This test claim was processed through [facility]- copay amounts may vary at other pharmacies due to pharmacy/plan contracts, or as the patient moves through the different stages of their insurance plan.   PA #/Case ID/Reference #: [id number]

## 2023-09-15 NOTE — Telephone Encounter (Signed)
 Pharmacy Patient Advocate Encounter   Received notification from Pt Calls Messages that prior authorization for Fluticasone -Salmeterol 250-50MCG/ACT aerosol powder is required/requested.   Insurance verification completed.   The patient is insured through [facility] .   Per test claim: PA required; PA submitted to above mentioned insurance via CoverMyMeds Key/confirmation #/EOC [facility] Status is pending

## 2023-09-15 NOTE — Telephone Encounter (Signed)
 PA request has been Approved. New Encounter has been or will be created for follow up. For additional info see Pharmacy Prior Auth telephone encounter from 09/15/2023.

## 2023-11-02 ENCOUNTER — Other Ambulatory Visit: Payer: Self-pay | Admitting: Family Medicine

## 2023-11-02 DIAGNOSIS — I10 Essential (primary) hypertension: Secondary | ICD-10-CM

## 2023-11-26 ENCOUNTER — Other Ambulatory Visit: Payer: Self-pay | Admitting: Family Medicine

## 2023-11-26 DIAGNOSIS — J4530 Mild persistent asthma, uncomplicated: Secondary | ICD-10-CM

## 2024-01-12 ENCOUNTER — Other Ambulatory Visit: Payer: Self-pay | Admitting: Family Medicine

## 2024-01-20 ENCOUNTER — Other Ambulatory Visit: Payer: Self-pay

## 2024-01-20 ENCOUNTER — Ambulatory Visit: Payer: Self-pay | Admitting: Family Medicine

## 2024-01-20 DIAGNOSIS — E790 Hyperuricemia without signs of inflammatory arthritis and tophaceous disease: Secondary | ICD-10-CM

## 2024-01-20 LAB — CMP14+EGFR
ALT: 16 IU/L (ref 0–44)
AST: 24 IU/L (ref 0–40)
Albumin: 4.6 g/dL (ref 3.9–4.9)
Alkaline Phosphatase: 79 IU/L (ref 47–123)
BUN/Creatinine Ratio: 13 (ref 10–24)
BUN: 13 mg/dL (ref 8–27)
Bilirubin Total: 0.5 mg/dL (ref 0.0–1.2)
CO2: 26 mmol/L (ref 20–29)
Calcium: 10 mg/dL (ref 8.6–10.2)
Chloride: 101 mmol/L (ref 96–106)
Creatinine, Ser: 0.97 mg/dL (ref 0.76–1.27)
Globulin, Total: 2.8 g/dL (ref 1.5–4.5)
Glucose: 80 mg/dL (ref 70–99)
Potassium: 4.2 mmol/L (ref 3.5–5.2)
Sodium: 140 mmol/L (ref 134–144)
Total Protein: 7.4 g/dL (ref 6.0–8.5)
eGFR: 86 mL/min/1.73 (ref >59)

## 2024-01-20 LAB — CBC WITH DIFFERENTIAL/PLATELET
Basophils Absolute: 0 x10E3/uL (ref 0.0–0.2)
Basos: 1 %
EOS (ABSOLUTE): 0.2 x10E3/uL (ref 0.0–0.4)
Eos: 3 %
Hematocrit: 44.4 % (ref 37.5–51.0)
Hemoglobin: 13.9 g/dL (ref 13.0–17.7)
Immature Grans (Abs): 0 x10E3/uL (ref 0.0–0.1)
Immature Granulocytes: 0 %
Lymphocytes Absolute: 1.6 x10E3/uL (ref 0.7–3.1)
Lymphs: 26 %
MCH: 27.7 pg (ref 26.6–33.0)
MCHC: 31.3 g/dL — ABNORMAL LOW (ref 31.5–35.7)
MCV: 89 fL (ref 79–97)
Monocytes Absolute: 0.5 x10E3/uL (ref 0.1–0.9)
Monocytes: 8 %
Neutrophils Absolute: 3.9 x10E3/uL (ref 1.4–7.0)
Neutrophils: 62 %
Platelets: 292 x10E3/uL (ref 150–450)
RBC: 5.01 x10E6/uL (ref 4.14–5.80)
RDW: 14.5 % (ref 11.6–15.4)
WBC: 6.1 x10E3/uL (ref 3.4–10.8)

## 2024-01-20 LAB — LIPID PANEL
Chol/HDL Ratio: 5.6 ratio — ABNORMAL HIGH (ref 0.0–5.0)
Cholesterol, Total: 178 mg/dL (ref 100–199)
HDL: 32 mg/dL — ABNORMAL LOW (ref >39)
LDL Chol Calc (NIH): 116 mg/dL — ABNORMAL HIGH (ref 0–99)
Triglycerides: 168 mg/dL — ABNORMAL HIGH (ref 0–149)
VLDL Cholesterol Cal: 30 mg/dL (ref 5–40)

## 2024-01-20 LAB — TSH: TSH: 1.49 u[IU]/mL (ref 0.450–4.500)

## 2024-01-26 ENCOUNTER — Ambulatory Visit (INDEPENDENT_AMBULATORY_CARE_PROVIDER_SITE_OTHER): Admitting: Family Medicine

## 2024-01-26 ENCOUNTER — Encounter: Payer: Self-pay | Admitting: Family Medicine

## 2024-01-26 VITALS — BP 120/72 | HR 100 | Resp 16 | Ht 66.0 in | Wt 221.0 lb

## 2024-01-26 DIAGNOSIS — Z0000 Encounter for general adult medical examination without abnormal findings: Secondary | ICD-10-CM

## 2024-01-26 DIAGNOSIS — E785 Hyperlipidemia, unspecified: Secondary | ICD-10-CM | POA: Diagnosis not present

## 2024-01-26 DIAGNOSIS — I10 Essential (primary) hypertension: Secondary | ICD-10-CM

## 2024-01-26 DIAGNOSIS — Z23 Encounter for immunization: Secondary | ICD-10-CM

## 2024-01-26 NOTE — Assessment & Plan Note (Signed)
 Hyperlipidemia:Low fat diet discussed and encouraged.   Lipid Panel  Lab Results  Component Value Date   CHOL 178 01/19/2024   HDL 32 (L) 01/19/2024   LDLCALC 116 (H) 01/19/2024   TRIG 168 (H) 01/19/2024   CHOLHDL 5.6 (H) 01/19/2024     Needs to take med regularly and also work on change in diet

## 2024-01-26 NOTE — Progress Notes (Signed)
   Todd Randall     MRN: [id number]      DOB: 19-Jan-1956  Chief Complaint  Patient presents with   Annual Exam    cpe    HPI: Patient is in for annual physical exam. No other health concerns are expressed or addressed at the visit. Recent labs, if available are reviewed. Immunization is reviewed , and  updated if needed.    PE; BP 120/72   Pulse 100   Resp 16   Ht 5' 6 (1.676 m)   Wt 221 lb 0.6 oz (100.3 kg)   SpO2 94%   BMI 35.68 kg/m   Pleasant male, alert and oriented x 3, in no cardio-pulmonary distress. Afebrile. HEENT No facial trauma or asymetry. Sinuses non tender. EOMI External ears normal,  Neck: supple, no adenopathy,JVD or thyromegaly.No bruits.  Chest: Clear to ascultation bilaterally.No crackles or wheezes. Non tender to palpation  Cardiovascular system; Heart sounds normal,  S1 and  S2 ,no S3.  No murmur, or thrill. Apical beat not displaced Peripheral pulses normal.  Abdomen: Soft, non tender, no organomegaly or masses. No bruits. Bowel sounds normal. No guarding, tenderness or rebound.    Musculoskeletal exam: Full ROM of spine, hips , shoulders and knees. No deformity ,swelling or crepitus noted. No muscle wasting or atrophy.   Neurologic: Cranial nerves 2 to 12 intact. Power, tone ,sensation and reflexes normal throughout. No disturbance in gait. No tremor.  Skin: Intact, no ulceration, erythema , scaling or rash noted. Pigmentation normal throughout  Psych; Normal mood and affect. Judgement and concentration normal   Assessment & Plan:  Annual physical exam Annual exam as documented. Counseling done  re healthy lifestyle involving commitment to 150 minutes exercise per week, heart healthy diet, and attaining healthy weight.The importance of adequate sleep also discussed. Regular seat belt use and home safety, is also discussed. Changes in health habits are decided on by the patient with goals and time frames  set for  achieving them. Immunization and cancer screening needs are specifically addressed at this visit.

## 2024-01-26 NOTE — Assessment & Plan Note (Addendum)
 Pt to return for vaccines

## 2024-01-26 NOTE — Assessment & Plan Note (Signed)

## 2024-01-26 NOTE — Assessment & Plan Note (Signed)
 Controlled, no change in medication DASH diet and commitment to daily physical activity for a minimum of 30 minutes discussed and encouraged, as a part of hypertension management. The importance of attaining a healthy weight is also discussed.     01/26/2024    8:28 AM 01/26/2024    8:01 AM 07/22/2023    8:29 AM 07/22/2023    8:27 AM 07/22/2023    8:09 AM 03/05/2023    9:26 AM 03/05/2023    9:03 AM  BP/Weight  Systolic BP 120 142 140 140 154 154 174  Diastolic BP 72 85 76 84 83 84 92  Wt. (Lbs)  221.04   223.04    BMI  35.68 kg/m2   36 kg/m2

## 2024-01-26 NOTE — Patient Instructions (Signed)
 F/U in 6 months  TdaP and flu vaccine today  Congrats on no vaping!  Continue to work on lifestyle change  No med changes  Fasting lipid, cmp and eGFr 3 to 5 days before next appt  Thanks for choosing [facility], we consider it a privelige to serve you.

## 2024-03-04 ENCOUNTER — Other Ambulatory Visit: Payer: BC Managed Care – PPO

## 2024-03-04 DIAGNOSIS — R972 Elevated prostate specific antigen [PSA]: Secondary | ICD-10-CM

## 2024-03-05 LAB — PSA, TOTAL AND FREE
PSA, Free Pct: 24.2 %
PSA, Free: 2.2 ng/mL
Prostate Specific Ag, Serum: 9.1 ng/mL — ABNORMAL HIGH (ref 0.0–4.0)

## 2024-03-07 ENCOUNTER — Ambulatory Visit: Payer: Self-pay

## 2024-03-22 ENCOUNTER — Ambulatory Visit: Admitting: Urology

## 2024-03-24 ENCOUNTER — Ambulatory Visit: Payer: BC Managed Care – PPO | Admitting: Urology

## 2024-07-26 ENCOUNTER — Ambulatory Visit: Admitting: Family Medicine
